# Patient Record
Sex: Female | Born: 1945 | Race: White | Hispanic: No | Marital: Married | Smoking: Former smoker
Health system: Southern US, Community
[De-identification: ages and names within clinical notes are randomized; demographics above are authoritative.]

## PROBLEM LIST (undated history)

## (undated) DIAGNOSIS — E039 Hypothyroidism, unspecified: Secondary | ICD-10-CM

## (undated) DIAGNOSIS — C801 Malignant (primary) neoplasm, unspecified: Secondary | ICD-10-CM

## (undated) DIAGNOSIS — N6099 Unspecified benign mammary dysplasia of unspecified breast: Principal | ICD-10-CM

## (undated) DIAGNOSIS — I10 Essential (primary) hypertension: Secondary | ICD-10-CM

## (undated) DIAGNOSIS — R4781 Slurred speech: Secondary | ICD-10-CM

## (undated) DIAGNOSIS — M1990 Unspecified osteoarthritis, unspecified site: Secondary | ICD-10-CM

## (undated) DIAGNOSIS — R55 Syncope and collapse: Secondary | ICD-10-CM

## (undated) DIAGNOSIS — E079 Disorder of thyroid, unspecified: Secondary | ICD-10-CM

## (undated) HISTORY — PX: BREAST LUMPECTOMY: SHX2

## (undated) HISTORY — DX: Unspecified benign mammary dysplasia of unspecified breast: N60.99

## (undated) HISTORY — DX: Disorder of thyroid, unspecified: E07.9

## (undated) HISTORY — DX: Malignant (primary) neoplasm, unspecified: C80.1

## (undated) HISTORY — DX: Unspecified osteoarthritis, unspecified site: M19.90

## (undated) HISTORY — DX: Syncope and collapse: R55

## (undated) HISTORY — DX: Essential (primary) hypertension: I10

## (undated) HISTORY — DX: Slurred speech: R47.81

## (undated) HISTORY — DX: Hypothyroidism, unspecified: E03.9

---

## 1972-10-22 HISTORY — PX: TUBAL LIGATION: SHX77

## 1981-10-22 HISTORY — PX: EYE SURGERY: SHX253

## 1998-03-08 ENCOUNTER — Ambulatory Visit (HOSPITAL_COMMUNITY): Admission: RE | Admit: 1998-03-08 | Discharge: 1998-03-08 | Payer: Self-pay | Admitting: Cardiology

## 2000-03-27 ENCOUNTER — Other Ambulatory Visit: Admission: RE | Admit: 2000-03-27 | Discharge: 2000-03-27 | Payer: Self-pay | Admitting: Obstetrics & Gynecology

## 2001-07-16 ENCOUNTER — Other Ambulatory Visit: Admission: RE | Admit: 2001-07-16 | Discharge: 2001-07-16 | Payer: Self-pay | Admitting: Obstetrics & Gynecology

## 2002-07-31 ENCOUNTER — Other Ambulatory Visit: Admission: RE | Admit: 2002-07-31 | Discharge: 2002-07-31 | Payer: Self-pay | Admitting: Family Medicine

## 2003-08-24 ENCOUNTER — Other Ambulatory Visit: Admission: RE | Admit: 2003-08-24 | Discharge: 2003-08-24 | Payer: Self-pay | Admitting: Obstetrics & Gynecology

## 2005-01-29 ENCOUNTER — Other Ambulatory Visit: Admission: RE | Admit: 2005-01-29 | Discharge: 2005-01-29 | Payer: Self-pay | Admitting: Obstetrics and Gynecology

## 2005-05-09 ENCOUNTER — Encounter: Admission: RE | Admit: 2005-05-09 | Discharge: 2005-05-09 | Payer: Self-pay | Admitting: Endocrinology

## 2009-09-01 ENCOUNTER — Encounter: Admission: RE | Admit: 2009-09-01 | Discharge: 2009-09-01 | Payer: Self-pay | Admitting: Obstetrics & Gynecology

## 2009-10-24 ENCOUNTER — Ambulatory Visit: Payer: Self-pay | Admitting: Vascular Surgery

## 2010-10-22 HISTORY — PX: BREAST SURGERY: SHX581

## 2010-10-22 HISTORY — PX: BREAST LUMPECTOMY: SHX2

## 2010-11-08 ENCOUNTER — Encounter
Admission: RE | Admit: 2010-11-08 | Discharge: 2010-11-08 | Payer: Self-pay | Source: Home / Self Care | Attending: Obstetrics & Gynecology | Admitting: Obstetrics & Gynecology

## 2010-11-12 ENCOUNTER — Encounter: Payer: Self-pay | Admitting: Obstetrics & Gynecology

## 2010-11-13 ENCOUNTER — Encounter
Admission: RE | Admit: 2010-11-13 | Discharge: 2010-11-13 | Payer: Self-pay | Source: Home / Self Care | Attending: Obstetrics & Gynecology | Admitting: Obstetrics & Gynecology

## 2010-11-13 ENCOUNTER — Other Ambulatory Visit: Payer: Self-pay | Admitting: Diagnostic Radiology

## 2010-11-20 ENCOUNTER — Encounter
Admission: RE | Admit: 2010-11-20 | Discharge: 2010-11-20 | Payer: Self-pay | Source: Home / Self Care | Attending: Obstetrics & Gynecology | Admitting: Obstetrics & Gynecology

## 2010-11-21 ENCOUNTER — Other Ambulatory Visit: Payer: Self-pay | Admitting: General Surgery

## 2010-11-21 DIAGNOSIS — C50912 Malignant neoplasm of unspecified site of left female breast: Secondary | ICD-10-CM

## 2010-11-30 ENCOUNTER — Other Ambulatory Visit: Payer: Self-pay | Admitting: General Surgery

## 2010-11-30 DIAGNOSIS — C50912 Malignant neoplasm of unspecified site of left female breast: Secondary | ICD-10-CM

## 2010-12-04 ENCOUNTER — Other Ambulatory Visit (HOSPITAL_COMMUNITY): Payer: Self-pay | Admitting: General Surgery

## 2010-12-04 ENCOUNTER — Ambulatory Visit (HOSPITAL_COMMUNITY)
Admission: RE | Admit: 2010-12-04 | Discharge: 2010-12-04 | Disposition: A | Discharge status: Home / Self Care | Source: Ambulatory Visit | Attending: General Surgery | Admitting: General Surgery

## 2010-12-04 ENCOUNTER — Encounter (HOSPITAL_COMMUNITY)
Admission: RE | Admit: 2010-12-04 | Discharge: 2010-12-04 | Disposition: A | Discharge status: Home / Self Care | Source: Ambulatory Visit | Attending: General Surgery | Admitting: General Surgery

## 2010-12-04 DIAGNOSIS — D0590 Unspecified type of carcinoma in situ of unspecified breast: Secondary | ICD-10-CM | POA: Insufficient documentation

## 2010-12-04 DIAGNOSIS — D0592 Unspecified type of carcinoma in situ of left breast: Secondary | ICD-10-CM

## 2010-12-04 DIAGNOSIS — Z01818 Encounter for other preprocedural examination: Secondary | ICD-10-CM | POA: Insufficient documentation

## 2010-12-04 LAB — DIFFERENTIAL
Eosinophils Absolute: 0.1 10*3/uL (ref 0.0–0.7)
Eosinophils Relative: 2 % (ref 0–5)
Lymphocytes Relative: 17 % (ref 12–46)
Monocytes Absolute: 1.1 10*3/uL — ABNORMAL HIGH (ref 0.1–1.0)
Monocytes Relative: 14 % — ABNORMAL HIGH (ref 3–12)
Neutro Abs: 5.1 10*3/uL (ref 1.7–7.7)
Neutrophils Relative %: 67 % (ref 43–77)

## 2010-12-04 LAB — COMPREHENSIVE METABOLIC PANEL
ALT: 24 U/L (ref 0–35)
AST: 26 U/L (ref 0–37)
Albumin: 4 g/dL (ref 3.5–5.2)
BUN: 24 mg/dL — ABNORMAL HIGH (ref 6–23)
GFR calc Af Amer: 56 mL/min — ABNORMAL LOW (ref >60)
Glucose, Bld: 168 mg/dL — ABNORMAL HIGH (ref 70–99)
Potassium: 4.4 mEq/L (ref 3.5–5.1)
Sodium: 138 mEq/L (ref 135–145)

## 2010-12-04 LAB — URINALYSIS, ROUTINE W REFLEX MICROSCOPIC
Bilirubin Urine: NEGATIVE
Ketones, ur: NEGATIVE mg/dL
Specific Gravity, Urine: 1.012 (ref 1.005–1.030)
pH: 7 (ref 5.0–8.0)

## 2010-12-04 LAB — CBC
HCT: 42.2 % (ref 36.0–46.0)
MCHC: 33.4 g/dL (ref 30.0–36.0)
RBC: 4.58 MIL/uL (ref 3.87–5.11)

## 2010-12-04 LAB — TSH: TSH: 0.381 u[IU]/mL (ref 0.350–4.500)

## 2010-12-06 ENCOUNTER — Other Ambulatory Visit: Payer: Self-pay | Admitting: General Surgery

## 2010-12-06 ENCOUNTER — Ambulatory Visit
Admission: RE | Admit: 2010-12-06 | Discharge: 2010-12-06 | Disposition: A | Discharge status: Home / Self Care | Source: Ambulatory Visit | Attending: General Surgery | Admitting: General Surgery

## 2010-12-06 ENCOUNTER — Ambulatory Visit (HOSPITAL_COMMUNITY)
Admission: RE | Admit: 2010-12-06 | Discharge: 2010-12-06 | Disposition: A | Discharge status: Home / Self Care | Source: Ambulatory Visit | Attending: General Surgery | Admitting: General Surgery

## 2010-12-06 DIAGNOSIS — F411 Generalized anxiety disorder: Secondary | ICD-10-CM | POA: Insufficient documentation

## 2010-12-06 DIAGNOSIS — C50912 Malignant neoplasm of unspecified site of left female breast: Secondary | ICD-10-CM

## 2010-12-06 DIAGNOSIS — Z794 Long term (current) use of insulin: Secondary | ICD-10-CM | POA: Insufficient documentation

## 2010-12-06 DIAGNOSIS — G4733 Obstructive sleep apnea (adult) (pediatric): Secondary | ICD-10-CM | POA: Insufficient documentation

## 2010-12-06 DIAGNOSIS — D0590 Unspecified type of carcinoma in situ of unspecified breast: Secondary | ICD-10-CM | POA: Insufficient documentation

## 2010-12-06 DIAGNOSIS — I10 Essential (primary) hypertension: Secondary | ICD-10-CM | POA: Insufficient documentation

## 2010-12-06 DIAGNOSIS — Z87891 Personal history of nicotine dependence: Secondary | ICD-10-CM | POA: Insufficient documentation

## 2010-12-06 DIAGNOSIS — E109 Type 1 diabetes mellitus without complications: Secondary | ICD-10-CM | POA: Insufficient documentation

## 2010-12-06 LAB — GLUCOSE, CAPILLARY

## 2010-12-10 NOTE — Op Note (Signed)
April Long, April Long                ACCOUNT NO.:  [id number]  MEDICAL RECORD NO.:  [id number]           PATIENT TYPE:  O  LOCATION:  [facility]                         FACILITY:  [facility]  PHYSICIAN:  Angelia Mould. Derrell Lolling, M.D.DATE OF BIRTH:  [birth year]  DATE OF PROCEDURE:  12/06/2010 DATE OF DISCHARGE:  12/06/2010                              OPERATIVE REPORT   PREOPERATIVE DIAGNOSIS:  Lobular carcinoma in situ, left breast.  POSTOPERATIVE DIAGNOSIS:  Lobular carcinoma in situ, left breast.  OPERATION PERFORMED:  Left partial mastectomy with needle localizations.  SURGEON:  Angelia Mould. Derrell Lolling, MD  OPERATIVE INDICATIONS:  This is a 65 year old Caucasian female with type 1, insulin-dependent diabetes and hypertension.  She has no prior history of breast problems.  Recent screening mammogram showed an area of suspicious microcalcifications at the 4 o'clock position to the left breast.  Core biopsy of this area showed lobular carcinoma in situ which was ER 100% positive, PR 50% positive.  MRI was then performed which showed a 1.8-cm area of hematoma in the lower outer quadrant of the left breast with an adjacent 1.7-cm area of enhancement.  This appeared to be a solitary finding.  There was no evidence of any invasive disease. Lymph nodes were clinically and radiographically negative.  She was counseled as an outpatient.  I felt that the next step in her care would be to simply excise this area to exclude occult invasive disease.  She is brought to the operating room electively.  OPERATIVE TECHNIQUE:  The patient underwent wire localization at the [facility] this morning.  The wire entered from the far lateral breast and was directed medially at about the 3:30 to 4 o'clock position.  The wire went slightly anterior to the calcifications in the marker clip.  The patient was brought to the operating room.  General anesthesia was induced.  The left breast was prepped and draped  in a sterile fashion.  Intravenous antibiotics were given.  Surgical time-out was held identifying the correct patient, correct procedure and correct site.  Marcaine 0.5% with epinephrine used as a local infiltration anesthetic.  A radially oriented incision was made about the 4 o'clock position of the left breast, going right through the insertion site of the wire.  Dissection was carried down into the breast tissue circumferentially around the wire.  The specimen was removed.  It was marked with a 6-color margin marker kit.  Specimen mammogram was performed and we could see the calcifications in the marker clip in the center of the specimen.  It was felt to be adequately excised by both me and the radiologist.  The specimen was sent for routine pathologic exam. Hemostasis was excellent and achieved electrocautery.  The wound was irrigated with saline.  The breast tissue was closed in 2 layers of interrupted sutures of 3-0 Vicryl.  The skin was closed with running subcuticular suture of 4-0 Monocryl and Dermabond.  The patient tolerated the procedure well and was taken to recovery room in stable condition.  Estimated blood loss was about 10-15 mL.  Complication was none.  Sponge, needle and instruments counts  were correct.     Angelia Mould. Derrell Lolling, M.D.     HMI/MEDQ  D:  12/06/2010  T:  12/07/2010  Job:  [id number]  cc:   Veverly Fells. Altheimer, M.D. [facility]  Electronically Signed by Claud Kelp M.D. on 12/10/2010 07:08:31 PM

## 2010-12-29 ENCOUNTER — Encounter (HOSPITAL_BASED_OUTPATIENT_CLINIC_OR_DEPARTMENT_OTHER): Admitting: Oncology

## 2010-12-29 ENCOUNTER — Other Ambulatory Visit: Payer: Self-pay | Admitting: Oncology

## 2010-12-29 DIAGNOSIS — C50919 Malignant neoplasm of unspecified site of unspecified female breast: Secondary | ICD-10-CM

## 2010-12-29 LAB — CBC WITH DIFFERENTIAL/PLATELET
BASO%: 0.7 % (ref 0.0–2.0)
Basophils Absolute: 0 10*3/uL (ref 0.0–0.1)
EOS%: 1.8 % (ref 0.0–7.0)
HCT: 39.2 % (ref 34.8–46.6)
LYMPH%: 20.9 % (ref 14.0–49.7)
MONO#: 0.8 10*3/uL (ref 0.1–0.9)
MONO%: 13.4 % (ref 0.0–14.0)
NEUT#: 3.7 10*3/uL (ref 1.5–6.5)
NEUT%: 63.2 % (ref 38.4–76.8)
RBC: 4.29 10*6/uL (ref 3.70–5.45)
RDW: 13.3 % (ref 11.2–14.5)
lymph#: 1.2 10*3/uL (ref 0.9–3.3)

## 2010-12-29 LAB — COMPREHENSIVE METABOLIC PANEL
Creatinine, Ser: 1.02 mg/dL (ref 0.40–1.20)
Total Bilirubin: 0.5 mg/dL (ref 0.3–1.2)
Total Protein: 6.8 g/dL (ref 6.0–8.3)

## 2011-02-17 ENCOUNTER — Encounter (INDEPENDENT_AMBULATORY_CARE_PROVIDER_SITE_OTHER): Payer: Self-pay | Admitting: General Surgery

## 2011-03-06 NOTE — Procedures (Signed)
LOWER EXTREMITY VENOUS REFLUX EXAM   INDICATION:  Bilateral swelling, pain, discoloration and varicosities.   EXAM:  Using color-flow imaging and pulse Doppler spectral analysis,  bilateral common femoral, superficial femoral, popliteal, posterior  tibial, greater and lesser saphenous veins were evaluated.  There is no  evidence suggesting deep venous insufficiency in the bilateral lower  extremities.   The bilateral saphenofemoral junction is competent.  The bilateral  greater saphenous vein is competent.   The bilateral proximal short saphenous vein demonstrates competency.   GSV Diameter (used if found to be incompetent only)                                            Right    Left  Proximal Greater Saphenous Vein           cm       cm  Proximal-to-mid-thigh                     cm       cm  Mid thigh                                 cm       cm  Mid-distal thigh                          cm       cm  Distal thigh                              cm       cm  Knee                                      cm       cm   IMPRESSION:  1. Bilateral greater saphenous veins are competent.  2. The deep venous system is competent.  3. The bilateral lesser saphenous veins are competent.  4. All varicosities appear to be connected to incompetent perforator      veins, on the right measuring 0.36 cm in the medial mid calf; on      the left, the medial mid to distal calf, measuring 0.38 cm; and      posterior calf, measuring 0.35 cm.   ___________________________________________  Quita Skye Hart [place], M.D.   CJ/MEDQ  D:  10/24/2009  T:  10/25/2009  Job:  [id number]

## 2011-03-06 NOTE — Consult Note (Signed)
NEW PATIENT CONSULTATION   April Long, April Long  DOB:  12-01-1945                                       10/24/2009  ZOXWR#:[id number]   Patient is a 65 year old female referred for possible venous  insufficiency, both lower extremities.  She has been having some scaly  thickening of the skin bilaterally between the knee and the ankle, the  left slightly worse than the right.  She describes an aching area in the  anterior aspect of the shin bilaterally after ambulating about 2 miles.  She does not have rest pain or history of nonhealing ulcers, infection,  bleeding, thrombophlebitis, deep venous thrombosis, or other vascular  problems.  She has had some varicose veins in the left calf evaluated by  Dr. Sondra Come in [facility], who does not recommend any specific treatment.  She does not wear elastic compression stockings but does have some mild  edema of the legs as the day progresses.   PAST MEDICAL HISTORY/STABLE CHRONIC PROBLEMS:  1. Type 1 diabetes mellitus.  2. Hyperlipidemia.  3. Negative for hypertension, coronary artery disease, COPD, or      stroke.   FAMILY HISTORY:  Negative for coronary artery disease, diabetes, or  stroke.   SOCIAL HISTORY:  She is married.  She is retired.  Has not smoked in  over 8 years.  Drinks occasional glass of wine.   REVIEW OF SYSTEMS:  Negative for weight loss, chest pain, dyspnea on  exertion, PND, orthopnea, productive cough, bronchitis.  Negative GI,  GU.  Vascular is negative.  No neurologic, musculoskeletal psychiatric,  or ENT symptoms.  All other systems are negative.   ALLERGIES:  Sulfa drugs.   PHYSICAL EXAMINATION:  Blood pressure 123/60, heart rate 83,  respirations 18.  Generally, she is a well-developed, well-nourished  middle-aged female in no apparent distress, alert and oriented x3.  Neck  is supple, 3+ carotid pulses palpable.  No bruits are audible.  Neurologic exam is normal.  HEENT exam is intact.  EOMs  intact.  Chest  clear to auscultation.  CARDIOVASCULAR:  Regular rhythm with no murmurs.  Abdomen is soft, nontender with no masses.  She has 3+ femoral,  popliteal, and a 3+ dorsalis pedis pulse on the right and a 2+ on the  left.  Both feet are adequately perfused with no edema.  Left leg has a  patch of varicosities in the medial calf slightly posteriorly, and there  is also some small reticular veins near the left medial malleolus.  There are some spider veins in both anterior thigh regions.   I ordered a venous duplex exam today, which I visualized and  interpreted.  It revealed the deep systems to be normal bilaterally.  Great saphenous and small saphenous veins had no reflux bilaterally.  There was an incompetent perforator in the left mid calf communicating  with the varicosities.   I do not think her symptoms are due to venous insufficiency.  She has  been told the past that she has PAD, but I do not think her arterial  insufficiency is severe but probably more mild because of her diabetes  and certainly not symptomatic enough to treat.  I have recommended  elastic compression stockings, and if she would like to have these small  varicosities and reticular and spider veins treated, we will be happy to  treat her with sclerotherapy, which she is considering.     Quita Skye Hart [place], M.D.  Electronically Signed   JDL/MEDQ  D:  10/24/2009  T:  10/25/2009  Job:  [id number]   cc:   Freddy Finner, M.D.

## 2011-04-17 ENCOUNTER — Encounter (HOSPITAL_BASED_OUTPATIENT_CLINIC_OR_DEPARTMENT_OTHER): Admitting: Oncology

## 2011-04-17 ENCOUNTER — Other Ambulatory Visit: Payer: Self-pay | Admitting: Oncology

## 2011-04-17 DIAGNOSIS — C50919 Malignant neoplasm of unspecified site of unspecified female breast: Secondary | ICD-10-CM

## 2011-04-17 LAB — CBC WITH DIFFERENTIAL/PLATELET
BASO%: 0.4 % (ref 0.0–2.0)
EOS%: 1.4 % (ref 0.0–7.0)
MCH: 31.6 pg (ref 25.1–34.0)
MCHC: 34.3 g/dL (ref 31.5–36.0)
MCV: 92.1 fL (ref 79.5–101.0)
MONO%: 13.3 % (ref 0.0–14.0)
RBC: 4.05 10*6/uL (ref 3.70–5.45)
RDW: 13.6 % (ref 11.2–14.5)

## 2011-04-17 LAB — COMPREHENSIVE METABOLIC PANEL
AST: 20 U/L (ref 0–37)
Albumin: 3.8 g/dL (ref 3.5–5.2)
Alkaline Phosphatase: 41 U/L (ref 39–117)
BUN: 27 mg/dL — ABNORMAL HIGH (ref 6–23)
Potassium: 4 mEq/L (ref 3.5–5.3)
Sodium: 139 mEq/L (ref 135–145)
Total Protein: 6.3 g/dL (ref 6.0–8.3)

## 2011-06-12 ENCOUNTER — Other Ambulatory Visit: Payer: Self-pay | Admitting: Obstetrics and Gynecology

## 2011-09-18 DIAGNOSIS — H3520 Other non-diabetic proliferative retinopathy, unspecified eye: Secondary | ICD-10-CM

## 2011-09-18 HISTORY — DX: Other non-diabetic proliferative retinopathy, unspecified eye: H35.20

## 2011-10-05 ENCOUNTER — Other Ambulatory Visit: Payer: Self-pay | Admitting: *Deleted

## 2011-10-20 ENCOUNTER — Telehealth: Payer: Self-pay | Admitting: Oncology

## 2011-10-20 NOTE — Telephone Encounter (Signed)
Called pt, left message for February 2013 lab and MD

## 2011-11-15 ENCOUNTER — Encounter (INDEPENDENT_AMBULATORY_CARE_PROVIDER_SITE_OTHER): Payer: Self-pay | Admitting: General Surgery

## 2011-11-29 ENCOUNTER — Encounter: Payer: Self-pay | Admitting: Oncology

## 2011-11-29 ENCOUNTER — Other Ambulatory Visit (HOSPITAL_BASED_OUTPATIENT_CLINIC_OR_DEPARTMENT_OTHER)

## 2011-11-29 ENCOUNTER — Telehealth: Payer: Self-pay | Admitting: Oncology

## 2011-11-29 ENCOUNTER — Ambulatory Visit (HOSPITAL_BASED_OUTPATIENT_CLINIC_OR_DEPARTMENT_OTHER): Admitting: Oncology

## 2011-11-29 DIAGNOSIS — D0502 Lobular carcinoma in situ of left breast: Secondary | ICD-10-CM

## 2011-11-29 DIAGNOSIS — D0590 Unspecified type of carcinoma in situ of unspecified breast: Secondary | ICD-10-CM

## 2011-11-29 DIAGNOSIS — N6099 Unspecified benign mammary dysplasia of unspecified breast: Secondary | ICD-10-CM | POA: Insufficient documentation

## 2011-11-29 DIAGNOSIS — C50919 Malignant neoplasm of unspecified site of unspecified female breast: Secondary | ICD-10-CM

## 2011-11-29 HISTORY — DX: Unspecified benign mammary dysplasia of unspecified breast: N60.99

## 2011-11-29 HISTORY — DX: Lobular carcinoma in situ of left breast: D05.02

## 2011-11-29 LAB — CBC WITH DIFFERENTIAL/PLATELET
Basophils Absolute: 0 10*3/uL (ref 0.0–0.1)
EOS%: 2.6 % (ref 0.0–7.0)
HGB: 13.4 g/dL (ref 11.6–15.9)
MCH: 31.7 pg (ref 25.1–34.0)
MCV: 93.9 fL (ref 79.5–101.0)
MONO%: 11.7 % (ref 0.0–14.0)
RDW: 14.3 % (ref 11.2–14.5)

## 2011-11-29 LAB — COMPREHENSIVE METABOLIC PANEL
AST: 27 U/L (ref 0–37)
BUN: 28 mg/dL — ABNORMAL HIGH (ref 6–23)
Calcium: 9.4 mg/dL (ref 8.4–10.5)
Chloride: 102 mEq/L (ref 96–112)
Creatinine, Ser: 1.22 mg/dL — ABNORMAL HIGH (ref 0.50–1.10)

## 2011-11-29 MED ORDER — TAMOXIFEN CITRATE 20 MG PO TABS: 20.0000 mg | ORAL_TABLET | Freq: Every day | ORAL | Status: AC

## 2011-11-29 NOTE — Progress Notes (Signed)
OFFICE PROGRESS NOTE  CC Dr. Claud Kelp DIAGNOSIS: 66 year old female with lobular carcinoma in situ of the left breast  PRIOR THERAPY:  #1 patient is status post lumpectomy on 12/06/2010. 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  #2 she was begun on chemoprevention with tamoxifen 20 mg daily.  CURRENT THERAPY: Tamoxifen 20 mg daily since March 2012  INTERVAL HISTORY: April Long 66 y.o. female returns for Followup visit. Her last visit with me was in June 2012. Overall she is doing well she is without any significant complaints she denies any fevers chills night sweats headaches shortness of breath chest pains palpitations. She did develop some urinary tract infections over the last 6 months. Initially was felt that it could possibly be vaginal bleeding she had a full evaluation performed there was no evidence of any vaginal pathology. She was then treated for urinary tract infections and she has not had any recurrence of her bleeding again. Overall she's doing well she does get some hot flashes she has not noticed any swelling in her lower extremities no thrombosis. She has no headaches double vision blurring of vision she has no shortness of breath no chest pains. She does have some urinary incontinence and she is working with urology to his strength and her pelvic muscles. Remainder of the 10 point review of systems is negative her  MEDICAL HISTORY: Past Medical History  Diagnosis Date  . Diabetes mellitus     type 1, for almost 48 years  . Thyroid disease   . 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 Lobular carcinoma in situ 11/29/2011    ALLERGIES:  is allergic to sulfa drugs cross reactors.  MEDICATIONS:  Current Outpatient Prescriptions  Medication Sig Dispense Refill  . aspirin 81 MG tablet Take 81 mg by mouth daily.      Marland Kitchen atorvastatin (LIPITOR) 10 MG tablet Take 10 mg by mouth daily.       Marland Kitchen co-enzyme Q-10 30 MG capsule Take 100 mg by mouth 3 (three) times daily.      . Multiple Vitamin (MULTIVITAMIN) tablet Take 1 tablet by mouth daily.      . tamoxifen (NOLVADEX) 20 MG tablet Take 20 mg by mouth daily.      . Escitalopram Oxalate (LEXAPRO PO) Take 10 mg by mouth. For Anxiety, no dosage listed      . Insulin Aspart (NOVOLOG [place]) Inject into the skin. Insulin pump       . Levothyroxine Sodium (SYNTHROID PO) Take by mouth.        Marland Kitchen lisinopril (PRINIVIL,ZESTRIL) 20 MG tablet Take 20 mg by mouth daily.        . Misc Natural Products (CHOLESTEROL RELIEF PO) Take by mouth.        . tamoxifen (NOLVADEX) 20 MG tablet Take 1 tablet (20 mg total) by mouth daily.  90 tablet  4  . VITAMIN D, CHOLECALCIFEROL, PO Take 1,000 Units by mouth.         SURGICAL HISTORY:  Past Surgical History  Procedure Date  . Tubal ligation 1974  . Eye surgery 1983    REVIEW OF SYSTEMS:  Pertinent items are noted in HPI.   PHYSICAL EXAMINATION: General appearance: alert, cooperative and appears stated age Neck: no adenopathy, no carotid bruit, no JVD, supple, symmetrical, trachea midline and thyroid not enlarged, symmetric, no tenderness/mass/nodules Lymph nodes: Cervical, supraclavicular, and axillary  nodes normal. Resp: clear to auscultation bilaterally and normal percussion bilaterally Back: symmetric, no curvature. ROM normal. No CVA tenderness. Cardio: regular rate and rhythm, S1, S2 normal, no murmur, click, rub or gallop GI: soft, non-tender; bowel sounds normal; no masses,  no organomegaly Extremities: extremities normal, atraumatic, no cyanosis or edema Neurologic: Alert and oriented X 3, normal strength and tone. Normal symmetric reflexes. Normal coordination and gait Bilateral breast examination: Patient has no masses nipple discharge. Left breast reveals a well-healed surgical scar. No nodularity or masses ECOG PERFORMANCE STATUS: 1 - Symptomatic but completely ambulatory  Blood  pressure 124/66, pulse 85, temperature 99.2 F (37.3 C), temperature source Oral, height 5\' 7"  (1.702 m), weight 187 lb 3.2 oz (84.913 kg).  LABORATORY DATA: Lab Results  Component Value Date   WBC 6.3 11/29/2011   HGB 13.4 11/29/2011   HCT 39.7 11/29/2011   MCV 93.9 11/29/2011   PLT 216 11/29/2011      Chemistry      Component Value Date/Time   NA 139 04/17/2011 1036   K 4.0 04/17/2011 1036   CL 103 04/17/2011 1036   CO2 28 04/17/2011 1036   BUN 27* 04/17/2011 1036   CREATININE 1.07 04/17/2011 1036      Component Value Date/Time   CALCIUM 9.0 04/17/2011 1036   ALKPHOS 41 04/17/2011 1036   AST 20 04/17/2011 1036   ALT 16 04/17/2011 1036   BILITOT 0.4 04/17/2011 1036       RADIOGRAPHIC STUDIES:  No results found.  ASSESSMENT: 66 year old female with lobular carcinoma in situ originally diagnosed in October 2012. Since then she has been on tamoxifen 20 mg daily overall she's doing well. Her mammograms are up-to-date.   PLAN: Patient will be seen back in 6 months time in the high-risk clinic by Colman Cater. Prescription for tamoxifen was given to the patient.   All questions were answered. The patient knows to call the clinic with any problems, questions or concerns. We can certainly see the patient much sooner if necessary.  I spent 25 minutes counseling the patient face to face. The total time spent in the appointment was 30 minutes.    Drue Second, MD Medical/Oncology [facility] [phone] (beeper) [phone] (Office)  11/29/2011, 10:06 AM

## 2011-11-29 NOTE — Telephone Encounter (Signed)
gve the pt her aug 2013 appts °

## 2011-12-10 ENCOUNTER — Other Ambulatory Visit: Payer: Self-pay | Admitting: Obstetrics & Gynecology

## 2011-12-10 DIAGNOSIS — Z853 Personal history of malignant neoplasm of breast: Secondary | ICD-10-CM

## 2011-12-19 ENCOUNTER — Encounter

## 2011-12-20 ENCOUNTER — Ambulatory Visit
Admission: RE | Admit: 2011-12-20 | Discharge: 2011-12-20 | Disposition: A | Discharge status: Home / Self Care | Source: Ambulatory Visit | Attending: Obstetrics & Gynecology | Admitting: Obstetrics & Gynecology

## 2011-12-20 ENCOUNTER — Encounter (INDEPENDENT_AMBULATORY_CARE_PROVIDER_SITE_OTHER): Payer: Self-pay | Admitting: General Surgery

## 2011-12-20 ENCOUNTER — Ambulatory Visit (INDEPENDENT_AMBULATORY_CARE_PROVIDER_SITE_OTHER): Admitting: General Surgery

## 2011-12-20 VITALS — BP 112/60 | HR 68 | Temp 97.8°F | Resp 18 | Ht 67.0 in | Wt 181.0 lb

## 2011-12-20 DIAGNOSIS — D0502 Lobular carcinoma in situ of left breast: Secondary | ICD-10-CM

## 2011-12-20 DIAGNOSIS — D0590 Unspecified type of carcinoma in situ of unspecified breast: Secondary | ICD-10-CM

## 2011-12-20 DIAGNOSIS — Z853 Personal history of malignant neoplasm of breast: Secondary | ICD-10-CM

## 2011-12-20 NOTE — Progress Notes (Signed)
Patient ID: CAERA ENWRIGHT, female   DOB: 1946-03-17, 66 y.o.   MRN: [id number]  Chief Complaint  Patient presents with  . Breast Cancer Long Term Follow Up    MGM scheduled after 12/09/10    HPI Kenyia Wambolt Wiesen is a 66 y.o. female.  She returns for long-term followup and reevaluation of lobular carcinoma in situ left breast.  On November 05, 2010 she underwent left partial mastectomy with needle localization. Final pathology showed receptor positive lobular carcinoma in situ. She recovered uneventfully. She was referred to Dr. Drue Second in the high risk clinic. She has been on tamoxifen and is doing fairly well with that.  She has no new complaints about her breast. She is scheduled for diagnostic mammograms this afternoon. HPI  Past Medical History  Diagnosis Date  . Diabetes mellitus     type 1, for almost 48 years  . Thyroid disease   . 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 Cancer   . Osteoporosis     early stage    Past Surgical History  Procedure Date  . Tubal ligation 1974  . Eye surgery 1983    retinal reattachment  . Breast surgery 2012    lumpectomy    Family History  Problem Relation Age of Onset  . Alzheimer's disease Mother   . Heart disease Father   . Heart failure Father     Social History History  Substance Use Topics  . Smoking status: Former Smoker    Quit date: 12/19/2000  . Smokeless tobacco: Never Used  . Alcohol Use: 4.2 oz/week    7 Glasses of wine per week     1 -2 glasses of wine daily or weekly    Allergies  Allergen Reactions  . Sulfa Drugs Cross Reactors Rash    On chest only    Current Outpatient Prescriptions  Medication Sig Dispense Refill  . Coenzyme Q10 (CO Q-10 PO) Take by mouth as needed.      Marland Kitchen aspirin 81 MG tablet Take 81 mg by mouth daily.      Marland Kitchen atorvastatin (LIPITOR) 10 MG tablet Take 10 mg by mouth daily.      Marland Kitchen  co-enzyme Q-10 30 MG capsule Take 100 mg by mouth 3 (three) times daily.      . Escitalopram Oxalate (LEXAPRO PO) Take 10 mg by mouth. For Anxiety, no dosage listed      . Insulin Aspart (NOVOLOG [place]) Inject into the skin. Insulin pump       . Levothyroxine Sodium (SYNTHROID PO) Take by mouth.        Marland Kitchen lisinopril (PRINIVIL,ZESTRIL) 20 MG tablet Take 20 mg by mouth daily.        . Misc Natural Products (CHOLESTEROL RELIEF PO) Take by mouth.        . Multiple Vitamin (MULTIVITAMIN) tablet Take 1 tablet by mouth daily.      . tamoxifen (NOLVADEX) 20 MG tablet Take 1 tablet (20 mg total) by mouth daily.  90 tablet  4  . VITAMIN D, CHOLECALCIFEROL, PO Take 1,000 Units by mouth.         Review of Systems Review of Systems  Constitutional: Negative for fever, chills and unexpected weight change.  HENT: Negative for hearing loss, congestion, sore throat, trouble swallowing and voice change.   Eyes: Negative for visual disturbance.  Respiratory: Negative for cough  and wheezing.   Cardiovascular: Negative for chest pain, palpitations and leg swelling.  Gastrointestinal: Negative for nausea, vomiting, abdominal pain, diarrhea, constipation, blood in stool, abdominal distention and anal bleeding.  Genitourinary: Negative for hematuria, vaginal bleeding and difficulty urinating.  Musculoskeletal: Negative for arthralgias.  Skin: Negative for rash and wound.  Neurological: Negative for seizures, syncope and headaches.  Hematological: Negative for adenopathy. Does not bruise/bleed easily.  Psychiatric/Behavioral: Negative for confusion.    Blood pressure 112/60, pulse 68, temperature 97.8 F (36.6 C), temperature source Temporal, resp. rate 18, height 5\' 7"  (1.702 m), weight 181 lb (82.101 kg).  Physical Exam Physical Exam  Constitutional: She is oriented to person, place, and time. She appears well-developed and well-nourished. No distress.  HENT:  Head: Normocephalic and atraumatic.  Nose: Nose  normal.  Eyes: EOM are normal.  Neck: Neck supple. No JVD present. No tracheal deviation present. No thyromegaly present.  Cardiovascular: Normal rate, regular rhythm, normal heart sounds and intact distal pulses.   No murmur heard. Pulmonary/Chest: Effort normal and breath sounds normal. No respiratory distress. She has no wheezes. She has no rales. She exhibits no tenderness.       Well-healed scar lateral left breast, lower outer quadrant. No mass either breast. No axillary adenopathy bilaterally.  Lymphadenopathy:    She has no cervical adenopathy.  Neurological: She is alert and oriented to person, place, and time. She exhibits normal muscle tone. Coordination normal.  Skin: Skin is warm. No rash noted. She is not diaphoretic. No erythema. No pallor.  Psychiatric: She has a normal mood and affect. Her behavior is normal. Judgment and thought content normal.    Data Reviewed  I reviewed Dr. Milta Deiters notes from the cancer center. I reviewed my old  records. Assessment    Lobular carcinoma in situ left breast, lower outer quadrant, status post left partial mastectomy with negative margins.  No evidence of recurrence by physical exam one year postop  Patient is due for mammograms today    Plan    She is on tamoxifen and being followed by Dr. Drue Second every 6 months.  Considering the frequency with which she is being screened at a high risk clinic, I suggested to her that there is no added value in following up with me. She agrees with that.  She will get her mammograms today and annually.  Return to see me of any new problems arise.   Angelia Mould. Derrell Lolling, M.D., [facility] General and Minimally invasive Surgery Breast and Colorectal Surgery Office:   [phone] Pager:   [phone]     12/20/2011, 9:50 AM

## 2011-12-20 NOTE — Patient Instructions (Signed)
Your physical exam today is normal. I did not find any abnormality of either breast or any other lymph node areas.  Be sure to get your mammograms that are scheduled for this afternoon.  Since you are on tamoxifen and being followed by Dr. Welton Flakes every 6 months, there is probably no added value in seeing me. You may return to see me if any new problems arise.

## 2012-05-02 ENCOUNTER — Other Ambulatory Visit: Payer: Self-pay | Admitting: Obstetrics & Gynecology

## 2012-05-13 ENCOUNTER — Telehealth: Payer: Self-pay | Admitting: Medical Oncology

## 2012-05-13 NOTE — Telephone Encounter (Signed)
Spoke to patient about appointment change, new appointment 9/30- lab @ 0930, Dr. Welton Flakes @ 1000.  Patient confirmed date and time. Instructed patient to call with any questions or concerns

## 2012-05-28 ENCOUNTER — Other Ambulatory Visit: Admitting: Lab

## 2012-05-28 ENCOUNTER — Ambulatory Visit: Admitting: Oncology

## 2012-07-09 ENCOUNTER — Ambulatory Visit: Admitting: Oncology

## 2012-07-09 ENCOUNTER — Other Ambulatory Visit: Admitting: Lab

## 2012-07-21 ENCOUNTER — Encounter: Payer: Self-pay | Admitting: Oncology

## 2012-07-21 ENCOUNTER — Telehealth: Payer: Self-pay | Admitting: *Deleted

## 2012-07-21 ENCOUNTER — Other Ambulatory Visit (HOSPITAL_BASED_OUTPATIENT_CLINIC_OR_DEPARTMENT_OTHER): Admitting: Lab

## 2012-07-21 ENCOUNTER — Ambulatory Visit (HOSPITAL_BASED_OUTPATIENT_CLINIC_OR_DEPARTMENT_OTHER): Admitting: Oncology

## 2012-07-21 VITALS — BP 121/66 | HR 73 | Temp 98.4°F | Resp 20 | Ht 67.0 in | Wt 187.8 lb

## 2012-07-21 DIAGNOSIS — G4733 Obstructive sleep apnea (adult) (pediatric): Secondary | ICD-10-CM

## 2012-07-21 DIAGNOSIS — D0590 Unspecified type of carcinoma in situ of unspecified breast: Secondary | ICD-10-CM

## 2012-07-21 DIAGNOSIS — D0500 Lobular carcinoma in situ of unspecified breast: Secondary | ICD-10-CM

## 2012-07-21 DIAGNOSIS — C50919 Malignant neoplasm of unspecified site of unspecified female breast: Secondary | ICD-10-CM

## 2012-07-21 DIAGNOSIS — N6099 Unspecified benign mammary dysplasia of unspecified breast: Secondary | ICD-10-CM

## 2012-07-21 LAB — CBC WITH DIFFERENTIAL/PLATELET
Basophils Absolute: 0 10*3/uL (ref 0.0–0.1)
Eosinophils Absolute: 0.2 10*3/uL (ref 0.0–0.5)
HCT: 38.7 % (ref 34.8–46.6)
HGB: 13.2 g/dL (ref 11.6–15.9)
MCH: 32.3 pg (ref 25.1–34.0)
MONO#: 0.8 10*3/uL (ref 0.1–0.9)
NEUT%: 69.8 % (ref 38.4–76.8)
WBC: 6.6 10*3/uL (ref 3.9–10.3)
lymph#: 1 10*3/uL (ref 0.9–3.3)

## 2012-07-21 LAB — COMPREHENSIVE METABOLIC PANEL (CC13)
Alkaline Phosphatase: 40 U/L (ref 40–150)
Creatinine: 1.1 mg/dL (ref 0.6–1.1)
Glucose: 159 mg/dl — ABNORMAL HIGH (ref 70–99)
Sodium: 138 mEq/L (ref 136–145)
Total Bilirubin: 0.6 mg/dL (ref 0.20–1.20)
Total Protein: 6.2 g/dL — ABNORMAL LOW (ref 6.4–8.3)

## 2012-07-21 MED ORDER — TAMOXIFEN CITRATE 20 MG PO TABS: 20.0000 mg | ORAL_TABLET | Freq: Every day | ORAL | Status: DC

## 2012-07-21 NOTE — Progress Notes (Signed)
OFFICE PROGRESS NOTE  CC Dr. 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  #2 she was begun on chemoprevention with tamoxifen 20 mg daily.  CURRENT THERAPY: Tamoxifen 20 mg daily since March 2012  INTERVAL HISTORY: April Long 66 y.o. female returns for Followup visit. Overall she is doing well. She did have some bleeding issues vaginally but this is now resolved. She had D&Cs. No further rebleeding has occurred. In the meantime she remains on tamoxifen she has no him lumps bumps she denies any nausea vomiting fevers chills night sweats. No myalgias or arthralgias. Patient does use a CPAP for obstructive sleep apnea. Remainder of the 10 point review of systems is negative  MEDICAL HISTORY: Past Medical History  Diagnosis Date  . Diabetes mellitus     type 1, for almost 48 years  . Thyroid disease   . 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 Cancer   . Osteoporosis     early stage    ALLERGIES:  is allergic to sulfa drugs cross reactors.  MEDICATIONS:  Current Outpatient Prescriptions  Medication Sig Dispense Refill  . aspirin 81 MG tablet Take 81 mg by mouth daily.      Marland Kitchen atorvastatin (LIPITOR) 10 MG tablet Take 10 mg by mouth daily.      . Escitalopram Oxalate (LEXAPRO PO) Take 20 mg by mouth. For Anxiety, no dosage listed      . Insulin Aspart (NOVOLOG [place]) Inject into the skin. Insulin pump       . Levothyroxine Sodium (SYNTHROID PO) Take by mouth.        Marland Kitchen lisinopril (PRINIVIL,ZESTRIL) 20 MG tablet Take 20 mg by mouth daily.        . Multiple Vitamin (MULTIVITAMIN) tablet Take 1 tablet by mouth daily.      . tamoxifen (NOLVADEX) 20 MG tablet Take 20 mg by mouth daily.      Marland Kitchen VITAMIN D,  CHOLECALCIFEROL, PO Take 1,000 Units by mouth.       . co-enzyme Q-10 30 MG capsule Take 100 mg by mouth 3 (three) times daily.      . Coenzyme Q10 (CO Q-10 PO) Take by mouth as needed.        SURGICAL HISTORY:  Past Surgical History  Procedure Date  . Tubal ligation 1974  . Eye surgery 1983    retinal reattachment  . Breast surgery 2012    lumpectomy    REVIEW OF SYSTEMS:  Pertinent items are noted in HPI.   PHYSICAL EXAMINATION: General appearance: alert, cooperative and appears stated age Neck: no adenopathy, no carotid bruit, no JVD, supple, symmetrical, trachea midline and thyroid not enlarged, symmetric, no tenderness/mass/nodules Lymph nodes: Cervical, supraclavicular, and axillary nodes normal. Resp: clear to auscultation bilaterally and normal percussion bilaterally Back: symmetric, no curvature. ROM normal. No CVA tenderness. Cardio: regular rate and rhythm, S1, S2 normal, no murmur, click, rub or gallop GI: soft, non-tender; bowel sounds normal; no masses,  no organomegaly Extremities: extremities normal, atraumatic, no cyanosis or edema Neurologic: Alert and oriented X 3, normal strength and tone. Normal symmetric reflexes. Normal coordination and gait Bilateral breast examination: Patient has no masses nipple discharge. Left breast reveals a  well-healed surgical scar. No nodularity or masses ECOG PERFORMANCE STATUS: 1 - Symptomatic but completely ambulatory  Blood pressure 121/66, pulse 73, temperature 98.4 F (36.9 C), temperature source Oral, resp. rate 20, height 5\' 7"  (1.702 m), weight 187 lb 12.8 oz (85.186 kg).  LABORATORY DATA: Lab Results  Component Value Date   WBC 6.6 07/21/2012   HGB 13.2 07/21/2012   HCT 38.7 07/21/2012   MCV 94.9 07/21/2012   PLT 219 07/21/2012      Chemistry      Component Value Date/Time   NA 140 11/29/2011 0907   K 5.3 11/29/2011 0907   CL 102 11/29/2011 0907   CO2 27 11/29/2011 0907   BUN 28* 11/29/2011 0907   CREATININE 1.22* 11/29/2011  0907      Component Value Date/Time   CALCIUM 9.4 11/29/2011 0907   ALKPHOS 36* 11/29/2011 0907   AST 27 11/29/2011 0907   ALT 19 11/29/2011 0907   BILITOT 0.4 11/29/2011 0907       RADIOGRAPHIC STUDIES:  No results found.  ASSESSMENT: 66 year old female with lobular carcinoma in situ originally diagnosed in October 2012. Since then she has been on tamoxifen 20 mg daily overall she's doing well. Her mammograms are up-to-date.   PLAN:  #1 patient will continue tamoxifen 20 mg daily.  #2 I will see her back in 6 months time. She will call with any problems questions or concerns in the interim.  All questions were answered. The patient knows to call the clinic with any problems, questions or concerns. We can certainly see the patient much sooner if necessary.  I spent 25 minutes counseling the patient face to face. The total time spent in the appointment was 30 minutes.    Drue Second, MD Medical/Oncology [facility] [phone] (beeper) [phone] (Office)  07/21/2012, 10:03 AM

## 2012-07-21 NOTE — Patient Instructions (Addendum)
Doing well no evidence of recurrence  You are tolerating tamoxifen, a new prescription was given to you   I will see you back in 6 months.  Please call if you have any concerns that arise

## 2012-07-21 NOTE — Telephone Encounter (Signed)
Gave patient appointment for 01-22-2013 starting at 10:00am 

## 2013-01-21 ENCOUNTER — Other Ambulatory Visit: Payer: Self-pay | Admitting: Obstetrics & Gynecology

## 2013-01-21 DIAGNOSIS — Z853 Personal history of malignant neoplasm of breast: Secondary | ICD-10-CM

## 2013-01-21 DIAGNOSIS — Z124 Encounter for screening for malignant neoplasm of cervix: Secondary | ICD-10-CM | POA: Diagnosis not present

## 2013-01-23 DIAGNOSIS — E039 Hypothyroidism, unspecified: Secondary | ICD-10-CM | POA: Diagnosis not present

## 2013-01-23 DIAGNOSIS — E109 Type 1 diabetes mellitus without complications: Secondary | ICD-10-CM | POA: Diagnosis not present

## 2013-01-23 DIAGNOSIS — E785 Hyperlipidemia, unspecified: Secondary | ICD-10-CM | POA: Diagnosis not present

## 2013-01-23 DIAGNOSIS — M949 Disorder of cartilage, unspecified: Secondary | ICD-10-CM | POA: Diagnosis not present

## 2013-01-23 DIAGNOSIS — M899 Disorder of bone, unspecified: Secondary | ICD-10-CM | POA: Diagnosis not present

## 2013-01-28 DIAGNOSIS — G4700 Insomnia, unspecified: Secondary | ICD-10-CM | POA: Diagnosis not present

## 2013-01-28 DIAGNOSIS — G4730 Sleep apnea, unspecified: Secondary | ICD-10-CM | POA: Diagnosis not present

## 2013-01-29 DIAGNOSIS — R0989 Other specified symptoms and signs involving the circulatory and respiratory systems: Secondary | ICD-10-CM | POA: Diagnosis not present

## 2013-01-29 DIAGNOSIS — Z006 Encounter for examination for normal comparison and control in clinical research program: Secondary | ICD-10-CM | POA: Diagnosis not present

## 2013-01-29 DIAGNOSIS — R0609 Other forms of dyspnea: Secondary | ICD-10-CM | POA: Diagnosis not present

## 2013-01-29 DIAGNOSIS — R5381 Other malaise: Secondary | ICD-10-CM | POA: Diagnosis not present

## 2013-01-29 DIAGNOSIS — E559 Vitamin D deficiency, unspecified: Secondary | ICD-10-CM | POA: Diagnosis not present

## 2013-02-02 ENCOUNTER — Ambulatory Visit
Admission: RE | Admit: 2013-02-02 | Discharge: 2013-02-02 | Disposition: A | Discharge status: Home / Self Care | Payer: Medicare Other | Source: Ambulatory Visit | Attending: Obstetrics & Gynecology | Admitting: Obstetrics & Gynecology

## 2013-02-02 DIAGNOSIS — R928 Other abnormal and inconclusive findings on diagnostic imaging of breast: Secondary | ICD-10-CM | POA: Diagnosis not present

## 2013-02-02 DIAGNOSIS — Z853 Personal history of malignant neoplasm of breast: Secondary | ICD-10-CM

## 2013-02-03 ENCOUNTER — Telehealth: Payer: Self-pay | Admitting: Oncology

## 2013-02-03 NOTE — Telephone Encounter (Signed)
4/23 appt moved to PM due to KK in [facility] in the AM. S/w pt she is aware.

## 2013-02-04 DIAGNOSIS — R748 Abnormal levels of other serum enzymes: Secondary | ICD-10-CM | POA: Diagnosis not present

## 2013-02-04 DIAGNOSIS — R112 Nausea with vomiting, unspecified: Secondary | ICD-10-CM | POA: Diagnosis not present

## 2013-02-04 DIAGNOSIS — E860 Dehydration: Secondary | ICD-10-CM | POA: Diagnosis not present

## 2013-02-04 DIAGNOSIS — H5440 Blindness, one eye, unspecified eye: Secondary | ICD-10-CM | POA: Diagnosis present

## 2013-02-04 DIAGNOSIS — R197 Diarrhea, unspecified: Secondary | ICD-10-CM | POA: Diagnosis not present

## 2013-02-04 DIAGNOSIS — G4733 Obstructive sleep apnea (adult) (pediatric): Secondary | ICD-10-CM | POA: Diagnosis not present

## 2013-02-04 DIAGNOSIS — D72829 Elevated white blood cell count, unspecified: Secondary | ICD-10-CM | POA: Diagnosis not present

## 2013-02-04 DIAGNOSIS — Z794 Long term (current) use of insulin: Secondary | ICD-10-CM | POA: Diagnosis not present

## 2013-02-04 DIAGNOSIS — I959 Hypotension, unspecified: Secondary | ICD-10-CM | POA: Diagnosis not present

## 2013-02-04 DIAGNOSIS — E039 Hypothyroidism, unspecified: Secondary | ICD-10-CM | POA: Diagnosis present

## 2013-02-04 DIAGNOSIS — Z79899 Other long term (current) drug therapy: Secondary | ICD-10-CM | POA: Diagnosis not present

## 2013-02-04 DIAGNOSIS — R569 Unspecified convulsions: Secondary | ICD-10-CM | POA: Diagnosis not present

## 2013-02-04 DIAGNOSIS — R55 Syncope and collapse: Secondary | ICD-10-CM | POA: Diagnosis not present

## 2013-02-04 DIAGNOSIS — E108 Type 1 diabetes mellitus with unspecified complications: Secondary | ICD-10-CM | POA: Diagnosis not present

## 2013-02-04 DIAGNOSIS — E1065 Type 1 diabetes mellitus with hyperglycemia: Secondary | ICD-10-CM | POA: Diagnosis present

## 2013-02-04 DIAGNOSIS — IMO0002 Reserved for concepts with insufficient information to code with codable children: Secondary | ICD-10-CM | POA: Diagnosis not present

## 2013-02-04 DIAGNOSIS — R1110 Vomiting, unspecified: Secondary | ICD-10-CM | POA: Diagnosis not present

## 2013-02-17 DIAGNOSIS — E785 Hyperlipidemia, unspecified: Secondary | ICD-10-CM | POA: Diagnosis not present

## 2013-02-17 DIAGNOSIS — E109 Type 1 diabetes mellitus without complications: Secondary | ICD-10-CM | POA: Diagnosis not present

## 2013-02-17 DIAGNOSIS — M949 Disorder of cartilage, unspecified: Secondary | ICD-10-CM | POA: Diagnosis not present

## 2013-02-17 DIAGNOSIS — Z9641 Presence of insulin pump (external) (internal): Secondary | ICD-10-CM | POA: Diagnosis not present

## 2013-02-17 DIAGNOSIS — E039 Hypothyroidism, unspecified: Secondary | ICD-10-CM | POA: Diagnosis not present

## 2013-02-17 DIAGNOSIS — Z23 Encounter for immunization: Secondary | ICD-10-CM | POA: Diagnosis not present

## 2013-02-18 ENCOUNTER — Other Ambulatory Visit (HOSPITAL_BASED_OUTPATIENT_CLINIC_OR_DEPARTMENT_OTHER): Payer: Medicare Other | Admitting: Lab

## 2013-02-18 ENCOUNTER — Telehealth: Payer: Self-pay | Admitting: *Deleted

## 2013-02-18 ENCOUNTER — Ambulatory Visit (HOSPITAL_BASED_OUTPATIENT_CLINIC_OR_DEPARTMENT_OTHER): Payer: Medicare Other | Admitting: Oncology

## 2013-02-18 ENCOUNTER — Encounter: Payer: Self-pay | Admitting: Oncology

## 2013-02-18 VITALS — BP 125/67 | HR 80 | Temp 97.8°F | Resp 20 | Ht 67.0 in | Wt 173.1 lb

## 2013-02-18 DIAGNOSIS — D0500 Lobular carcinoma in situ of unspecified breast: Secondary | ICD-10-CM

## 2013-02-18 DIAGNOSIS — D0590 Unspecified type of carcinoma in situ of unspecified breast: Secondary | ICD-10-CM | POA: Diagnosis not present

## 2013-02-18 DIAGNOSIS — N6099 Unspecified benign mammary dysplasia of unspecified breast: Secondary | ICD-10-CM

## 2013-02-18 LAB — CBC WITH DIFFERENTIAL/PLATELET
Basophils Absolute: 0 10*3/uL (ref 0.0–0.1)
Eosinophils Absolute: 0.1 10*3/uL (ref 0.0–0.5)
HCT: 39.6 % (ref 34.8–46.6)
HGB: 13.2 g/dL (ref 11.6–15.9)
LYMPH%: 14.9 % (ref 14.0–49.7)
MCV: 91.5 fL (ref 79.5–101.0)
MONO%: 11.8 % (ref 0.0–14.0)
NEUT#: 6.3 10*3/uL (ref 1.5–6.5)
NEUT%: 71.8 % (ref 38.4–76.8)
Platelets: 252 10*3/uL (ref 145–400)
RBC: 4.32 10*6/uL (ref 3.70–5.45)

## 2013-02-18 LAB — COMPREHENSIVE METABOLIC PANEL (CC13)
ALT: 23 U/L (ref 0–55)
CO2: 29 mEq/L (ref 22–29)
Calcium: 9.2 mg/dL (ref 8.4–10.4)
Chloride: 105 mEq/L (ref 98–107)
Creatinine: 1 mg/dL (ref 0.6–1.1)

## 2013-02-18 NOTE — Progress Notes (Signed)
OFFICE PROGRESS NOTE  CC Dr. 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  #2 she was begun on chemoprevention with tamoxifen 20 mg daily.  CURRENT THERAPY: Tamoxifen 20 mg daily since March 2012  INTERVAL HISTORY: April Long 67 y.o. female returns for Followup visit. Overall she is doing well.. No further vaginal bleeding has occurred.she remains on tamoxifen she has no him lumps bumps she denies any nausea vomiting fevers chills night sweats. No myalgias or arthralgias. Patient does use a CPAP for obstructive sleep apnea. Remainder of the 10 point review of systems is negative  MEDICAL HISTORY: Past Medical History  Diagnosis Date  . Diabetes mellitus     type 1, for almost 48 years  . Thyroid disease   . 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 Lobular carcinoma in JYNW(G9562/1) 11/29/2011  . Cancer   . Osteoporosis     early stage    ALLERGIES:  is allergic to sulfa drugs cross reactors.  MEDICATIONS:  Current Outpatient Prescriptions  Medication Sig Dispense Refill  . aspirin 81 MG tablet Take 81 mg by mouth daily.      Marland Kitchen atorvastatin (LIPITOR) 10 MG tablet Take 10 mg by mouth daily.      Marland Kitchen co-enzyme Q-10 30 MG capsule Take 100 mg by mouth 3 (three) times daily.      . Insulin Aspart (NOVOLOG [place]) Inject into the skin. Insulin pump       . Levothyroxine Sodium (SYNTHROID PO) Take by mouth.        Marland Kitchen lisinopril (PRINIVIL,ZESTRIL) 20 MG tablet Take 20 mg by mouth daily.        . Multiple Vitamin (MULTIVITAMIN) tablet Take 1 tablet by mouth daily.      Marland Kitchen OVER THE COUNTER MEDICATION Take by mouth 2 (two) times daily.      . tamoxifen (NOLVADEX) 20 MG tablet Take 1 tablet (20 mg total) by mouth daily.  90 tablet  12  . VITAMIN D,  CHOLECALCIFEROL, PO Take 1,000 Units by mouth.       . Coenzyme Q10 (CO Q-10 PO) Take by mouth as needed.      . tamoxifen (NOLVADEX) 20 MG tablet Take 20 mg by mouth daily.       No current facility-administered medications for this visit.    SURGICAL HISTORY:  Past Surgical History  Procedure Laterality Date  . Tubal ligation  1974  . Eye surgery  1983    retinal reattachment  . Breast surgery  2012    lumpectomy    REVIEW OF SYSTEMS:  Pertinent items are noted in HPI.   PHYSICAL EXAMINATION: General appearance: alert, cooperative and appears stated age Neck: no adenopathy, no carotid bruit, no JVD, supple, symmetrical, trachea midline and thyroid not enlarged, symmetric, no tenderness/mass/nodules Lymph nodes: Cervical, supraclavicular, and axillary nodes normal. Resp: clear to auscultation bilaterally and normal percussion bilaterally Back: symmetric, no curvature. ROM normal. No CVA tenderness. Cardio: regular rate and rhythm, S1, S2 normal, no murmur, click, rub or gallop GI: soft, non-tender; bowel sounds normal; no masses,  no organomegaly Extremities: extremities normal, atraumatic, no cyanosis or edema Neurologic: Alert and oriented X 3, normal strength and tone. Normal symmetric reflexes. Normal coordination and gait  Bilateral breast examination: Patient has no masses nipple discharge. Left breast reveals a well-healed surgical scar. No nodularity or masses ECOG PERFORMANCE STATUS: 1 - Symptomatic but completely ambulatory  Blood pressure 125/67, pulse 80, temperature 97.8 F (36.6 C), temperature source Oral, resp. rate 20, height 5\' 7"  (1.702 m), weight 173 lb 1.6 oz (78.518 kg).  LABORATORY DATA: Lab Results  Component Value Date   WBC 8.7 02/18/2013   HGB 13.2 02/18/2013   HCT 39.6 02/18/2013   MCV 91.5 02/18/2013   PLT 252 02/18/2013      Chemistry      Component Value Date/Time   NA 142 02/18/2013 1207   NA 140 11/29/2011 0907   K 4.1 02/18/2013 1207   K 5.3  11/29/2011 0907   CL 105 02/18/2013 1207   CL 102 11/29/2011 0907   CO2 29 02/18/2013 1207   CO2 27 11/29/2011 0907   BUN 20.1 02/18/2013 1207   BUN 28* 11/29/2011 0907   CREATININE 1.0 02/18/2013 1207   CREATININE 1.22* 11/29/2011 0907      Component Value Date/Time   CALCIUM 9.2 02/18/2013 1207   CALCIUM 9.4 11/29/2011 0907   ALKPHOS 42 02/18/2013 1207   ALKPHOS 36* 11/29/2011 0907   AST 22 02/18/2013 1207   AST 27 11/29/2011 0907   ALT 23 02/18/2013 1207   ALT 19 11/29/2011 0907   BILITOT 0.49 02/18/2013 1207   BILITOT 0.4 11/29/2011 0907       RADIOGRAPHIC STUDIES:  No results found.  ASSESSMENT: 67 year old female with lobular carcinoma in situ originally diagnosed in October 2012. Since then she has been on tamoxifen 20 mg daily overall she's doing well. Her mammograms are up-to-date.   PLAN:  #1 patient will continue tamoxifen 20 mg daily.  #2 I will see her back in 12 months time. She will call with any problems questions or concerns in the interim.  All questions were answered. The patient knows to call the clinic with any problems, questions or concerns. We can certainly see the patient much sooner if necessary.  I spent 25 minutes counseling the patient face to face. The total time spent in the appointment was 30 minutes.    Drue Second, MD Medical/Oncology [facility] [phone] (beeper) [phone] (Office)  02/18/2013, 1:16 PM

## 2013-02-18 NOTE — Patient Instructions (Addendum)
Doing well  i will now see you back once a year

## 2013-02-18 NOTE — Telephone Encounter (Signed)
appts made and printed...td 

## 2013-02-24 DIAGNOSIS — G4700 Insomnia, unspecified: Secondary | ICD-10-CM | POA: Diagnosis not present

## 2013-02-24 DIAGNOSIS — G4730 Sleep apnea, unspecified: Secondary | ICD-10-CM | POA: Diagnosis not present

## 2013-03-05 DIAGNOSIS — F0630 Mood disorder due to known physiological condition, unspecified: Secondary | ICD-10-CM | POA: Diagnosis not present

## 2013-03-05 DIAGNOSIS — F411 Generalized anxiety disorder: Secondary | ICD-10-CM | POA: Diagnosis not present

## 2013-03-10 DIAGNOSIS — Z23 Encounter for immunization: Secondary | ICD-10-CM | POA: Diagnosis not present

## 2013-03-26 DIAGNOSIS — F0630 Mood disorder due to known physiological condition, unspecified: Secondary | ICD-10-CM | POA: Diagnosis not present

## 2013-03-26 DIAGNOSIS — F411 Generalized anxiety disorder: Secondary | ICD-10-CM | POA: Diagnosis not present

## 2013-04-08 DIAGNOSIS — G4700 Insomnia, unspecified: Secondary | ICD-10-CM | POA: Diagnosis not present

## 2013-04-13 DIAGNOSIS — M899 Disorder of bone, unspecified: Secondary | ICD-10-CM | POA: Diagnosis not present

## 2013-04-13 DIAGNOSIS — E785 Hyperlipidemia, unspecified: Secondary | ICD-10-CM | POA: Diagnosis not present

## 2013-04-13 DIAGNOSIS — E039 Hypothyroidism, unspecified: Secondary | ICD-10-CM | POA: Diagnosis not present

## 2013-04-13 DIAGNOSIS — E109 Type 1 diabetes mellitus without complications: Secondary | ICD-10-CM | POA: Diagnosis not present

## 2013-04-13 DIAGNOSIS — Z9641 Presence of insulin pump (external) (internal): Secondary | ICD-10-CM | POA: Diagnosis not present

## 2013-04-13 DIAGNOSIS — M949 Disorder of cartilage, unspecified: Secondary | ICD-10-CM | POA: Diagnosis not present

## 2013-04-14 DIAGNOSIS — G4700 Insomnia, unspecified: Secondary | ICD-10-CM | POA: Diagnosis not present

## 2013-04-15 DIAGNOSIS — E109 Type 1 diabetes mellitus without complications: Secondary | ICD-10-CM | POA: Diagnosis not present

## 2013-04-15 DIAGNOSIS — L84 Corns and callosities: Secondary | ICD-10-CM | POA: Diagnosis not present

## 2013-04-20 DIAGNOSIS — M949 Disorder of cartilage, unspecified: Secondary | ICD-10-CM | POA: Diagnosis not present

## 2013-04-20 DIAGNOSIS — E785 Hyperlipidemia, unspecified: Secondary | ICD-10-CM | POA: Diagnosis not present

## 2013-04-20 DIAGNOSIS — E109 Type 1 diabetes mellitus without complications: Secondary | ICD-10-CM | POA: Diagnosis not present

## 2013-04-20 DIAGNOSIS — Z9641 Presence of insulin pump (external) (internal): Secondary | ICD-10-CM | POA: Diagnosis not present

## 2013-04-20 DIAGNOSIS — M899 Disorder of bone, unspecified: Secondary | ICD-10-CM | POA: Diagnosis not present

## 2013-04-20 DIAGNOSIS — E039 Hypothyroidism, unspecified: Secondary | ICD-10-CM | POA: Diagnosis not present

## 2013-04-22 DIAGNOSIS — F411 Generalized anxiety disorder: Secondary | ICD-10-CM | POA: Diagnosis not present

## 2013-04-22 DIAGNOSIS — F0630 Mood disorder due to known physiological condition, unspecified: Secondary | ICD-10-CM | POA: Diagnosis not present

## 2013-05-19 DIAGNOSIS — H3520 Other non-diabetic proliferative retinopathy, unspecified eye: Secondary | ICD-10-CM | POA: Diagnosis not present

## 2013-05-19 DIAGNOSIS — E119 Type 2 diabetes mellitus without complications: Secondary | ICD-10-CM | POA: Diagnosis not present

## 2013-05-19 DIAGNOSIS — E11359 Type 2 diabetes mellitus with proliferative diabetic retinopathy without macular edema: Secondary | ICD-10-CM | POA: Diagnosis not present

## 2013-05-20 DIAGNOSIS — H3340 Traction detachment of retina, unspecified eye: Secondary | ICD-10-CM

## 2013-05-20 HISTORY — DX: Traction detachment of retina, unspecified eye: H33.40

## 2013-06-03 DIAGNOSIS — F411 Generalized anxiety disorder: Secondary | ICD-10-CM | POA: Diagnosis not present

## 2013-06-03 DIAGNOSIS — F0630 Mood disorder due to known physiological condition, unspecified: Secondary | ICD-10-CM | POA: Diagnosis not present

## 2013-07-22 DIAGNOSIS — F0630 Mood disorder due to known physiological condition, unspecified: Secondary | ICD-10-CM | POA: Diagnosis not present

## 2013-07-22 DIAGNOSIS — E039 Hypothyroidism, unspecified: Secondary | ICD-10-CM | POA: Diagnosis not present

## 2013-07-22 DIAGNOSIS — F411 Generalized anxiety disorder: Secondary | ICD-10-CM | POA: Diagnosis not present

## 2013-07-22 DIAGNOSIS — E785 Hyperlipidemia, unspecified: Secondary | ICD-10-CM | POA: Diagnosis not present

## 2013-07-22 DIAGNOSIS — E109 Type 1 diabetes mellitus without complications: Secondary | ICD-10-CM | POA: Diagnosis not present

## 2013-07-24 DIAGNOSIS — E109 Type 1 diabetes mellitus without complications: Secondary | ICD-10-CM | POA: Diagnosis not present

## 2013-07-24 DIAGNOSIS — Z23 Encounter for immunization: Secondary | ICD-10-CM | POA: Diagnosis not present

## 2013-07-24 DIAGNOSIS — M899 Disorder of bone, unspecified: Secondary | ICD-10-CM | POA: Diagnosis not present

## 2013-07-24 DIAGNOSIS — E039 Hypothyroidism, unspecified: Secondary | ICD-10-CM | POA: Diagnosis not present

## 2013-07-24 DIAGNOSIS — E785 Hyperlipidemia, unspecified: Secondary | ICD-10-CM | POA: Diagnosis not present

## 2013-07-24 DIAGNOSIS — Z9641 Presence of insulin pump (external) (internal): Secondary | ICD-10-CM | POA: Diagnosis not present

## 2013-08-03 ENCOUNTER — Encounter: Payer: Self-pay | Admitting: Neurology

## 2013-08-03 ENCOUNTER — Ambulatory Visit (INDEPENDENT_AMBULATORY_CARE_PROVIDER_SITE_OTHER): Payer: Medicare Other | Admitting: Neurology

## 2013-08-03 VITALS — BP 110/62 | HR 65 | Temp 98.2°F | Ht 67.0 in | Wt 173.0 lb

## 2013-08-03 DIAGNOSIS — R55 Syncope and collapse: Secondary | ICD-10-CM

## 2013-08-03 DIAGNOSIS — R4789 Other speech disturbances: Secondary | ICD-10-CM | POA: Diagnosis not present

## 2013-08-03 DIAGNOSIS — G459 Transient cerebral ischemic attack, unspecified: Secondary | ICD-10-CM

## 2013-08-03 DIAGNOSIS — R4781 Slurred speech: Secondary | ICD-10-CM

## 2013-08-03 HISTORY — DX: Slurred speech: R47.81

## 2013-08-03 HISTORY — DX: Syncope and collapse: R55

## 2013-08-03 NOTE — Progress Notes (Signed)
Subjective:    Patient ID: April Long is a 67 y.o. female.  HPI  Huston Foley, MD, PhD [facility] [address], [address] [address] [place], [place] [address]  Dear Dr. Leslie Dales,   I saw your patient, April Long, upon your kind request, in my neurological clinic today for initial consultation of her syncope and slurring of speech. The patient is unaccompanied today. As you know, April Long, is a very pleasant 67 year-old right-handed woman with an underlying medical history of hyperlipidemia, hypothyroidism, osteopenia, OSA on CPAP, OA, stasis dermatitis, PAD, anxiety and CIS with s/p L partial mastectomy, and diabetes type I, L eye blindness, with Hx of bilateral eye hemorrhages (was told not to take ASA), who was hospitalized in April of this year at [facility] with 2 episodes of syncope preceded by nausea, vomiting and diarrhea resulting in dehydration. Her symptoms were attributed to a viral gastroenteritis. She has not had further syncopal spells since then. In July of this year she had an episode of slurring of speech, lasting 15-20 minutes, which was not associated with any other neurological accompaniment, nor HA. She had garbled speech , and was aware of what she wanted to say and understood her husband. She did not have associated hypoglycemia at the time and has full recollection of the episode. She had recent blood work through your office on 07/22/2013 which I reviewed: Her BUN was 39 and creatinine was 1.1, glucose was 375, total cholesterol was 159, triglycerides were 65, HDL 72 and LDL 74. Her hemoglobin A1c was 6.1, TSH was 0.4 and free T4 was 0.85. She has no Hx of diabetic neuropathy, she quit smoking 12 years ago, and she drinks wine socially. She has never had a CTH or MRI brain. She is not on a daily ASA. She has a FHx of AD in Mother, MU and older sister 65 y.o.), no brain aneurysm, no stroke, no DM type I.  She currently feels at  baseline.   Her Past Medical History Is Significant For: Past Medical History  Diagnosis Date  . Diabetes mellitus     type 1, for almost 48 years  . Thyroid disease   . 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 Cancer   . Osteoporosis     early stage  . Syncope 08/03/2013  . Slurring of speech 08/03/2013    Her Past Surgical History Is Significant For: Past Surgical History  Procedure Laterality Date  . Tubal ligation  1974  . Eye surgery  1983    retinal reattachment  . Breast surgery  2012    lumpectomy    Her Family History Is Significant For: Family History  Problem Relation Age of Onset  . Alzheimer's disease Mother   . Heart disease Father   . Heart failure Father     Her Social History Is Significant For: History   Social History  . Marital Status: Married    Spouse Name: N/A    Number of Children: N/A  . Years of Education: N/A   Social History Main Topics  . Smoking status: Former Smoker    Quit date: 12/19/2000  . Smokeless tobacco: Never Used  . Alcohol Use: 4.2 oz/week    7 Glasses of wine per week     Comment: 1 -2 glasses of wine daily or weekly  . Drug Use: No  .  Sexual Activity: Yes   Other Topics Concern  . None   Social History Narrative  . None    Her Allergies Are:  Allergies  Allergen Reactions  . Sulfa Drugs Cross Reactors Rash    On chest only  :   Her Current Medications Are:  Outpatient Encounter Prescriptions as of 08/03/2013  Medication Sig Dispense Refill  . atorvastatin (LIPITOR) 10 MG tablet Take 10 mg by mouth daily.      Marland Kitchen co-enzyme Q-10 30 MG capsule Take 100 mg by mouth 3 (three) times daily.      . Insulin Aspart (NOVOLOG [place]) Inject into the skin. Insulin pump       . Levothyroxine Sodium (SYNTHROID PO) Take by mouth.        Marland Kitchen lisinopril (PRINIVIL,ZESTRIL) 20 MG tablet Take 10 mg by mouth daily.       . Multiple  Vitamin (MULTIVITAMIN) tablet Take 2 tablets by mouth daily.       Marland Kitchen OVER THE COUNTER MEDICATION Take by mouth 2 (two) times daily.      . tamoxifen (NOLVADEX) 20 MG tablet Take 1 tablet (20 mg total) by mouth daily.  90 tablet  12  . VITAMIN D, CHOLECALCIFEROL, PO Take 1,000 Units by mouth.       . [DISCONTINUED] aspirin 81 MG tablet Take 81 mg by mouth daily.      . [DISCONTINUED] Coenzyme Q10 (CO Q-10 PO) Take by mouth as needed.      . [DISCONTINUED] tamoxifen (NOLVADEX) 20 MG tablet Take 20 mg by mouth daily.       No facility-administered encounter medications on file as of 08/03/2013.  : Review of Systems:  Out of a complete 14 point review of systems, all are reviewed and negative with the exception of these symptoms as listed below:   Review of Systems  Gastrointestinal:       Incontinence  Genitourinary:       Incontinence  Neurological: Positive for speech difficulty.  Psychiatric/Behavioral: Positive for dysphoric mood. The patient is nervous/anxious.     Objective:  Neurologic Exam  Physical Exam Physical Examination:   Filed Vitals:   08/03/13 0948  BP: 110/62  Pulse: 65  Temp: 98.2 F (36.8 C)    General Examination: The patient is a very pleasant 67 y.o. female in no acute distress. She appears well-developed and well-nourished and well groomed. She is mildly anxious appearing.  HEENT: Normocephalic, atraumatic, pupils are equal, round and reactive to light and accommodation. Funduscopic exam is normal with sharp disc margins noted. Extraocular tracking is good without limitation to gaze excursion or nystagmus noted. Normal smooth pursuit is noted. Hearing is grossly intact. Tympanic membranes are clear bilaterally. Face is symmetric with normal facial animation and normal facial sensation. Speech is clear with no dysarthria noted. There is no hypophonia. There is no lip, neck/head, jaw or voice tremor. Neck is supple with full range of passive and active motion.  There are no carotid bruits on auscultation. Oropharynx exam reveals: mild mouth dryness, adequate dental hygiene and mild airway crowding. Mallampati is class III. Tongue protrudes centrally and palate elevates symmetrically.   Chest: Clear to auscultation without wheezing, rhonchi or crackles noted.  Heart: S1+S2+0, regular and normal without murmurs, rubs or gallops noted.   Abdomen: Soft, non-tender and non-distended with normal bowel sounds appreciated on auscultation.  Extremities: There is 2+ pitting edema in the distal lower extremities bilaterally. Pedal pulses are difficult to palpate. She has stasis-like  changes with erythema.   Skin: Warm and dry with lichenification in the distal LEs. She has mild varicose veins.   Musculoskeletal: exam reveals no obvious joint deformities, tenderness or joint swelling or erythema.   Neurologically:  Mental status: The patient is awake, alert and oriented in all 4 spheres. Her memory, attention, language and knowledge are appropriate. There is no aphasia, agnosia, apraxia or anomia. Speech is clear with normal prosody and enunciation. Thought process is linear. Mood is congruent and affect is normal.  Cranial nerves are as described above under HEENT exam. In addition, shoulder shrug is normal with equal shoulder height noted. Motor exam: Normal bulk, strength and tone is noted. There is no drift, tremor or rebound. Romberg is negative. Reflexes are 2+ throughout. Toes are downgoing bilaterally. Fine motor skills are intact with normal finger taps, normal hand movements, normal rapid alternating patting, normal foot taps and normal foot agility.  Cerebellar testing shows no dysmetria or intention tremor on finger to nose testing. Heel to shin is unremarkable bilaterally. There is no truncal or gait ataxia.  Sensory exam is intact to light touch, pinprick, vibration, temperature sense and in the upper extremities but there is some evidence of decreased  pinprick sensation and temperature sense in both distal lower extremities.  Gait, station and balance are unremarkable. No veering to one side is noted. No leaning to one side is noted. Posture is age-appropriate and stance is narrow based. No problems turning are noted. She turns en bloc. Tandem walk is not possible. She has difficulty with toe and heel stance.               Assessment and Plan:   In summary, April Long is a very pleasant 67 y.o.-year old female with a history of hyperlipidemia, hypothyroidism, osteopenia, OSA on CPAP, OA, stasis dermatitis, PAD, anxiety and CIS with s/p L partial mastectomy, and diabetes type I, who had a transient episode of slurring of speech 3 months ago. Her vascular RF include, HLP, DM, PAD and OSA. She may have had a TIA, I am unable to exclude it at this time. Her physical exam is stable and non-focal for the most part, but there is some evidence of PN, likely diabetic. She currently feels at baseline.  I had a long chat with the patient about my findings and the diagnosis of TIA, the prognosis and treatment options. We talked about medical treatments and non-pharmacological approaches. We talked about maintaining a healthy lifestyle in general. I encouraged the patient to eat healthy, exercise daily and keep well hydrated, to keep a scheduled bedtime and wake time routine, to not skip any meals and eat healthy snacks in between meals and to have protein with every meal. We talked about secondary prevention. I asked the patient to check with her eye doctor whether she can take a daily baby aspirin for vascular prevention. She was in agreement and will call us back. As far as further diagnostic testing is concerned, I suggested the following today: carotid duplex, echocardiogram, MRI brain without contrast and MRA head, blood work (today).   As far as medications are concerned, I recommended the following at this time: no other change.  I answered all her  questions today and the patient was in agreement with the above outlined plan. I can probably see her back on an as-needed basis. We will be calling her with her test results. Again, she is going to call us back after she finds out about  being able to take a baby aspirin.  Thank you very much for allowing me to participate in the care of this nice patient. If I can be of any further assistance to you please do not hesitate to call me at [phone].  Sincerely,   Huston Foley, MD, PhD

## 2013-08-03 NOTE — Patient Instructions (Addendum)
I think overall you are doing fairly well but I do want to suggest a few things today:  Remember to drink plenty of fluid, eat healthy meals and do not skip any meals. Try to eat protein with a every meal and eat a healthy snack such as fruit or nuts in between meals. Try to keep a regular sleep-wake schedule and try to exercise daily, particularly in the form of walking, 20-30 minutes a day, if you can.   As far as your medications are concerned, I would like to suggest no changes, but I do want you to call your eye doctor and inquire whether you could take a daily baby aspirin.    As far as diagnostic testing: MRI brain, MRA head, echocardiogram, [place] of carotid arteries.   I would like to see you back in 3 months, sooner if we need to. Please call us with any interim questions, concerns, problems, updates or refill requests.  Please also call us for any test results so we can go over those with you on the phone. Brett Canales is my clinical assistant and will answer any of your questions and relay your messages to me and also relay most of my messages to you.  Our phone number is [phone]. We also have an after hours call service for urgent matters and there is a physician on-call for urgent questions. For any emergencies you know to call 911 or go to the nearest emergency room.

## 2013-08-04 LAB — RPR: RPR: NONREACTIVE

## 2013-08-04 LAB — PROTIME-INR: Prothrombin Time: 11.2 s (ref 9.1–12.0)

## 2013-08-04 NOTE — Progress Notes (Signed)
Quick Note:  Please call and advise the patient that the recent labs we checked were within normal limits. We checked inflammatory, infectious and autoimmune markers, all of which were normal. No further action is required on these tests at this time. Please remind patient to keep any upcoming appointments or tests and to call us with any interim questions, concerns, problems or updates. Thanks,  Huston Foley, MD, PhD    ______

## 2013-08-05 ENCOUNTER — Telehealth: Payer: Self-pay | Admitting: Neurology

## 2013-08-05 ENCOUNTER — Ambulatory Visit (INDEPENDENT_AMBULATORY_CARE_PROVIDER_SITE_OTHER): Payer: Medicare Other

## 2013-08-05 DIAGNOSIS — G459 Transient cerebral ischemic attack, unspecified: Secondary | ICD-10-CM | POA: Diagnosis not present

## 2013-08-05 DIAGNOSIS — R55 Syncope and collapse: Secondary | ICD-10-CM

## 2013-08-05 DIAGNOSIS — R4781 Slurred speech: Secondary | ICD-10-CM

## 2013-08-06 NOTE — Progress Notes (Signed)
Quick Note:  I called pt and LMVM for her with lab results (normal). She is to call back as needed or questions. ______

## 2013-08-06 NOTE — Telephone Encounter (Signed)
Requesting CMA-Steve returns her call. Will fwd message.

## 2013-08-07 ENCOUNTER — Ambulatory Visit
Admission: RE | Admit: 2013-08-07 | Discharge: 2013-08-07 | Disposition: A | Discharge status: Home / Self Care | Payer: Medicare Other | Source: Ambulatory Visit | Attending: Neurology | Admitting: Neurology

## 2013-08-07 DIAGNOSIS — R55 Syncope and collapse: Secondary | ICD-10-CM

## 2013-08-07 DIAGNOSIS — R4781 Slurred speech: Secondary | ICD-10-CM

## 2013-08-07 DIAGNOSIS — G459 Transient cerebral ischemic attack, unspecified: Secondary | ICD-10-CM

## 2013-08-07 DIAGNOSIS — R51 Headache: Secondary | ICD-10-CM | POA: Diagnosis not present

## 2013-08-07 DIAGNOSIS — R4701 Aphasia: Secondary | ICD-10-CM

## 2013-08-10 NOTE — Progress Notes (Signed)
Quick Note:  Please call patient regarding the recent brain MRI: The brain scan showed a normal structure of the brain and overall mild volume loss which we call atrophy. There is shrinkage of the brain expected with aging.  There were changes in the deeper structures of the brain, which we call white matter changes or microvascular changes. These were reported as mild in her case. These are tiny white spots, that occur with time and are seen in a variety of conditions, including with normal aging, chronic hypertension, chronic headaches, especially migraine HAs, chronic diabetes, chronic hyperlipidemia. These are not strokes and no mass or lesion was seen which is reassuring. Again, there were no acute findings, such as a stroke, or mass or blood products. No further action is required on this test at this time, other than re-enforcing the importance of good blood pressure control, good cholesterol control, good blood sugar control, and weight management.  Her brain MRA, which is an imaging test of her brain blood vessels was reported as normal, which is reassuring. Please remind patient to keep any upcoming appointments or tests and to call us with any interim questions, concerns, problems or updates. Thanks,  Huston Foley, MD, PhD    ______

## 2013-08-11 NOTE — Telephone Encounter (Signed)
I called pt and relayed the MRI, MRA and lab tests results to her.  Carotid US results not available yet.  She verbalized understanding.

## 2013-08-11 NOTE — Progress Notes (Signed)
Quick Note:  I called and relayed the results of MRI brain and MRA to pts cell VM as per below. She is to call back if questions. ______

## 2013-08-31 ENCOUNTER — Other Ambulatory Visit (HOSPITAL_COMMUNITY): Payer: Self-pay | Admitting: Neurology

## 2013-08-31 DIAGNOSIS — G459 Transient cerebral ischemic attack, unspecified: Secondary | ICD-10-CM

## 2013-08-31 DIAGNOSIS — R4781 Slurred speech: Secondary | ICD-10-CM

## 2013-08-31 DIAGNOSIS — R55 Syncope and collapse: Secondary | ICD-10-CM

## 2013-09-01 ENCOUNTER — Encounter: Payer: Self-pay | Admitting: Cardiology

## 2013-09-01 ENCOUNTER — Ambulatory Visit (HOSPITAL_COMMUNITY): Payer: Medicare Other | Attending: Cardiology | Admitting: Cardiology

## 2013-09-01 DIAGNOSIS — E119 Type 2 diabetes mellitus without complications: Secondary | ICD-10-CM | POA: Insufficient documentation

## 2013-09-01 DIAGNOSIS — G459 Transient cerebral ischemic attack, unspecified: Secondary | ICD-10-CM

## 2013-09-01 DIAGNOSIS — Z8673 Personal history of transient ischemic attack (TIA), and cerebral infarction without residual deficits: Secondary | ICD-10-CM | POA: Diagnosis not present

## 2013-09-01 DIAGNOSIS — Z87891 Personal history of nicotine dependence: Secondary | ICD-10-CM | POA: Diagnosis not present

## 2013-09-01 DIAGNOSIS — E785 Hyperlipidemia, unspecified: Secondary | ICD-10-CM | POA: Insufficient documentation

## 2013-09-01 DIAGNOSIS — R55 Syncope and collapse: Secondary | ICD-10-CM | POA: Diagnosis not present

## 2013-09-01 DIAGNOSIS — R4781 Slurred speech: Secondary | ICD-10-CM

## 2013-09-01 NOTE — Progress Notes (Signed)
Echo performed. 

## 2013-09-04 NOTE — Progress Notes (Signed)
Quick Note:  Shared results with husband per Dr Teofilo Pod Normal findings, he understood ______

## 2013-09-04 NOTE — Progress Notes (Signed)
Quick Note:  Please advise patient that her recent echocardiogram which is the ultrasound of the heart showed no significant or sinister abnormality. She had good left heart function. No clot was seen. Huston Foley, MD, PhD [facility] ([facility]  ______

## 2013-09-04 NOTE — Progress Notes (Signed)
Quick Note:  Please advise patient that her recent echocardiogram your heart ultrasound was reported as having no significant abnormality. There was no clot. Heart pump function was normal. Huston Foley, MD, PhD [facility] ([facility]  ______

## 2013-10-27 DIAGNOSIS — E039 Hypothyroidism, unspecified: Secondary | ICD-10-CM | POA: Diagnosis not present

## 2013-10-27 DIAGNOSIS — E785 Hyperlipidemia, unspecified: Secondary | ICD-10-CM | POA: Diagnosis not present

## 2013-10-27 DIAGNOSIS — E109 Type 1 diabetes mellitus without complications: Secondary | ICD-10-CM | POA: Diagnosis not present

## 2013-11-02 DIAGNOSIS — M899 Disorder of bone, unspecified: Secondary | ICD-10-CM | POA: Diagnosis not present

## 2013-11-02 DIAGNOSIS — M949 Disorder of cartilage, unspecified: Secondary | ICD-10-CM | POA: Diagnosis not present

## 2013-11-02 DIAGNOSIS — E785 Hyperlipidemia, unspecified: Secondary | ICD-10-CM | POA: Diagnosis not present

## 2013-11-02 DIAGNOSIS — Z9641 Presence of insulin pump (external) (internal): Secondary | ICD-10-CM | POA: Diagnosis not present

## 2013-11-02 DIAGNOSIS — E039 Hypothyroidism, unspecified: Secondary | ICD-10-CM | POA: Diagnosis not present

## 2013-11-02 DIAGNOSIS — E109 Type 1 diabetes mellitus without complications: Secondary | ICD-10-CM | POA: Diagnosis not present

## 2013-11-19 DIAGNOSIS — F411 Generalized anxiety disorder: Secondary | ICD-10-CM | POA: Diagnosis not present

## 2013-11-19 DIAGNOSIS — F0630 Mood disorder due to known physiological condition, unspecified: Secondary | ICD-10-CM | POA: Diagnosis not present

## 2013-11-24 DIAGNOSIS — E11319 Type 2 diabetes mellitus with unspecified diabetic retinopathy without macular edema: Secondary | ICD-10-CM | POA: Diagnosis not present

## 2013-11-24 DIAGNOSIS — E1039 Type 1 diabetes mellitus with other diabetic ophthalmic complication: Secondary | ICD-10-CM | POA: Diagnosis not present

## 2013-11-24 DIAGNOSIS — E11311 Type 2 diabetes mellitus with unspecified diabetic retinopathy with macular edema: Secondary | ICD-10-CM | POA: Diagnosis not present

## 2013-11-26 DIAGNOSIS — F411 Generalized anxiety disorder: Secondary | ICD-10-CM | POA: Diagnosis not present

## 2013-11-26 DIAGNOSIS — F0630 Mood disorder due to known physiological condition, unspecified: Secondary | ICD-10-CM | POA: Diagnosis not present

## 2013-11-30 ENCOUNTER — Encounter: Payer: Self-pay | Admitting: Neurology

## 2013-11-30 ENCOUNTER — Ambulatory Visit (INDEPENDENT_AMBULATORY_CARE_PROVIDER_SITE_OTHER): Payer: Medicare Other | Admitting: Neurology

## 2013-11-30 VITALS — BP 121/66 | HR 72 | Temp 99.3°F | Ht 67.0 in | Wt 176.0 lb

## 2013-11-30 DIAGNOSIS — R55 Syncope and collapse: Secondary | ICD-10-CM | POA: Diagnosis not present

## 2013-11-30 DIAGNOSIS — G459 Transient cerebral ischemic attack, unspecified: Secondary | ICD-10-CM

## 2013-11-30 NOTE — Progress Notes (Signed)
Subjective:    Patient ID: April Long is a 68 y.o. female.  HPI    Interim history:   April Long, is a very pleasant 68 year-old right-handed woman with an underlying medical history of hyperlipidemia, hypothyroidism, osteopenia, OSA on CPAP, OA, stasis dermatitis, PAD, anxiety and CIS with s/p L partial mastectomy, and diabetes type I, L eye blindness, with Hx of bilateral eye hemorrhages, who presents for followup consultation of her syncopal spells as well as concern for recent TIA. She is unaccompanied by today. I first met her on 08/03/2013, which time she reported being hospitalized at [facility] in 4/14, with 2 episodes of syncope preceded by nausea, vomiting and diarrhea resulting in dehydration. Her symptoms were attributed to a viral gastroenteritis. She had not had further syncopal spells since then. She also reported, that in July 2014, she had an episode of slurring of speech, lasting 15-20 minutes, which was not associated with any other neurological accompaniment, nor HA. She had garbled speech , and was aware of what she wanted to say and understood her husband. She did not have associated hypoglycemia at the time and had full recollection of the episode. She had recent blood work through her PCP on 07/22/2013: BUN was 39, creatinine 1.1, glucose was 375, total cholesterol was 159, triglycerides were 65, HDL 72 and LDL 74. Her hemoglobin A1c was 6.1, TSH was 0.4 and free T4 was 0.85.  At the time of her visit in October with me I explained, that we could not exclude a TIA in addition to her syncopal spells. Her vascular risk factors include hyperlipidemia, diabetes, peripheral artery disease and sleep apnea. I suggested further workup with carotid Doppler studies, echocardiogram, MRI brain without contrast and MR a head as well as blood work. I suggested no new medications. Her brain MRA from 08/07/2013 without contrast showed: no significant stenosis of the medium to large  size intracranial vessels. Hypoplastic terminal right vertebral artery, hypoplastic A1 segment of the right anterior cerebral artery and persistent bilateral fetal origin of both posterior cerebral arteries are benign birth variants. In addition, personally reviewed the images through the PACS system. Her brain MRI from 08/07/2013 without contrast showed: Abnormal MRI scan the brain showing mild changes of chronic microvascular ischemia and generalized cerebral atrophy. In addition I reviewed the images through the PACS system. Her carotid Doppler study from 08/05/2013 showed no hemodynamically significant stenosis in either internal carotid artery. Her echocardiogram from 09/01/2013 showed an EF of 60-65%, very mild aortic stenosis, and mild left atrial dilatation, no sinister findings. We called and talked to her husband with the test results. Labs from 08/03/2013 showed normal CRP, normal ANA, normal INR, normal ESR, normal RPR. We called her with the test results, including her blood work results, and imaging test results. Today, she feels well, and has not had any recent syncopal or pre-syncopal spell or any slurring of speech or any new neurological Sx.  She has no Hx of diabetic neuropathy, she quit smoking 12 years ago, and she drinks wine socially. She is tolerating a baby ASA and uses CPAP regularly.   Her Past Medical History Is Significant For: Past Medical History  Diagnosis Date  . Diabetes mellitus     type 1, for almost 48 years  . Thyroid disease   . 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 Cancer   . Osteoporosis  early stage  . Syncope 08/03/2013  . Slurring of speech 08/03/2013    Her Past Surgical History Is Significant For: Past Surgical History  Procedure Laterality Date  . Tubal ligation  1974  . Eye surgery  1983    retinal reattachment  . Breast surgery  2012     lumpectomy    Her Family History Is Significant For: Family History  Problem Relation Age of Onset  . Alzheimer's disease Mother   . Heart disease Father   . Heart failure Father     Her Social History Is Significant For: History   Social History  . Marital Status: Married    Spouse Name: N/A    Number of Children: N/A  . Years of Education: N/A   Social History Main Topics  . Smoking status: Former Smoker    Quit date: 12/19/2000  . Smokeless tobacco: Never Used  . Alcohol Use: 4.2 oz/week    7 Glasses of wine per week     Comment: 1 -2 glasses of wine daily or weekly  . Drug Use: No  . Sexual Activity: Yes   Other Topics Concern  . None   Social History Narrative  . None    Her Allergies Are:  Allergies  Allergen Reactions  . Sulfa Drugs Cross Reactors Rash    On chest only  :   Her Current Medications Are:  Outpatient Encounter Prescriptions as of 11/30/2013  Medication Sig  . aspirin 81 MG tablet Take 81 mg by mouth daily.  Marland Kitchen atorvastatin (LIPITOR) 10 MG tablet Take 10 mg by mouth daily.  Marland Kitchen co-enzyme Q-10 30 MG capsule Take 100 mg by mouth 3 (three) times daily.  . Insulin Aspart (NOVOLOG [place]) Inject into the skin. Insulin pump   . Levothyroxine Sodium (SYNTHROID PO) Take by mouth.    Marland Kitchen lisinopril (PRINIVIL,ZESTRIL) 20 MG tablet Take 10 mg by mouth daily.   . Multiple Vitamin (MULTIVITAMIN) tablet Take 2 tablets by mouth daily.   Marland Kitchen OVER THE COUNTER MEDICATION Take by mouth 2 (two) times daily.  . tamoxifen (NOLVADEX) 20 MG tablet Take 1 tablet (20 mg total) by mouth daily.  Marland Kitchen VITAMIN D, CHOLECALCIFEROL, PO Take 1,000 Units by mouth.   . citalopram (CELEXA) 10 MG tablet Take 10 mg by mouth daily.  :  Review of Systems:  Out of a complete 14 point review of systems, all are reviewed and negative with the exception of these symptoms as listed below:   Review of Systems  Constitutional: Negative.   HENT: Negative.   Eyes: Negative.   Respiratory:  Negative.   Cardiovascular: Negative.   Gastrointestinal: Negative.   Endocrine: Negative.   Genitourinary: Negative.   Musculoskeletal: Negative.   Skin: Negative.   Allergic/Immunologic: Negative.   Neurological: Positive for speech difficulty.  Hematological: Negative.   Psychiatric/Behavioral: Negative.     Objective:  Neurologic Exam  Physical Exam Physical Examination:   Filed Vitals:   11/30/13 1219  BP: 121/66  Pulse: 72  Temp: 99.3 F (37.4 C)    General Examination: The patient is a very pleasant 68 y.o. female in no acute distress. She appears well-developed and well-nourished and well groomed. She is mildly anxious appearing.  HEENT: Normocephalic, atraumatic, pupils are anisocoric and round and reactive to light on the R only. She is blind on the OS with movements only noted on that side. Funduscopic exam is normal with sharp disc margins noted on the R, and cannot see the fundus  on the L. Extraocular tracking is good without limitation to gaze excursion or nystagmus noted. Normal smooth pursuit is noted. Hearing is grossly intact. Face is symmetric with normal facial animation and normal facial sensation. Speech is clear with no dysarthria noted. There is no hypophonia. There is no lip, neck/head, jaw or voice tremor. Neck is supple with full range of passive and active motion. There are no carotid bruits on auscultation. Oropharynx exam reveals: mild mouth dryness, adequate dental hygiene and mild airway crowding. Mallampati is class III. Tongue protrudes centrally and palate elevates symmetrically.   Chest: Clear to auscultation without wheezing, rhonchi or crackles noted.  Heart: S1+S2+0, regular and normal without murmurs, rubs or gallops noted.   Abdomen: Soft, non-tender and non-distended with normal bowel sounds appreciated on auscultation.  Extremities: There is 2+ pitting edema in the distal lower extremities bilaterally. Pedal pulses are difficult to  palpate. She has stasis-like changes with erythema and thick lichenified skin, also to a lesser degree in the dorsi of her hands.   Skin: Warm and dry with lichenification in the distal LEs. She has mild varicose veins.   Musculoskeletal: exam reveals no obvious joint deformities, tenderness or joint swelling or erythema.   Neurologically:  Mental status: The patient is awake, alert and oriented in all 4 spheres. Her memory, attention, language and knowledge are appropriate. There is no aphasia, agnosia, apraxia or anomia. Speech is clear with normal prosody and enunciation. Thought process is linear. Mood is congruent and affect is normal.  Cranial nerves are as described above under HEENT exam. In addition, shoulder shrug is normal with equal shoulder height noted. Motor exam: Normal bulk, strength and tone is noted. There is no drift, tremor or rebound. Romberg is negative. Reflexes are 2+ throughout. Toes are downgoing bilaterally. Fine motor skills are intact with normal finger taps, normal hand movements, normal rapid alternating patting, normal foot taps and normal foot agility.  Cerebellar testing shows no dysmetria or intention tremor on finger to nose testing. Heel to shin is unremarkable bilaterally. There is no truncal or gait ataxia.  Sensory exam is intact to light touch, pinprick, vibration, temperature sense and in the upper extremities but there is some evidence of decreased pinprick sensation and temperature sense in both distal lower extremities.  Gait, station and balance are unremarkable. No veering to one side is noted. No leaning to one side is noted. Posture is age-appropriate and stance is narrow based. No problems turning are noted. She turns en bloc. Tandem walk is difficult for her. There is intact toe and heel stance.               Assessment and Plan:   In summary, April Long is a very pleasant 68 year old female with a history of hyperlipidemia, hypothyroidism,  osteopenia, OSA on CPAP, OA, stasis dermatitis, PAD, anxiety and CIS with s/p L partial mastectomy, and diabetes type I, who had a transient episode of slurring of speech 3 months ago. She had a syncopal spell in the context of dehydration in the recent past. She currently feels at baseline and has not had any recent presyncopal spell, syncopal spell or any other focal neurologic event or symptom. Her vascular RF include, HLP, DM, PAD and OSA. She may have had a TIA, I am unable to exclude it and she had a fairly unremarkable TIA workup. Her physical exam is stable.  I had a long chat with the patient about my findings and the diagnosis of  TIA, the prognosis and treatment options. We talked about medical treatments and non-pharmacological approaches. We talked about maintaining a healthy lifestyle in general. I encouraged the patient to eat healthy, exercise daily and keep well hydrated, to keep a scheduled bedtime and wake time routine, to not skip any meals and eat healthy snacks in between meals and to have protein with every meal. We talked about secondary prevention. I asked the patient to check with her eye doctor whether she can take a daily baby aspirin for vascular prevention. She was in agreement and she is advised to stay better hydrated by drinking water. We talked about her recent test results including her carotid Doppler study, echocardiogram, MRI brain and MRA head, as well as her blood work today.  As far as medications are concerned, I recommended the following at this time: no other change.  I answered all her questions today and the patient was in agreement with the above outlined plan. I can see her back on an as-needed basis.

## 2013-11-30 NOTE — Patient Instructions (Addendum)
Continue your medications as directed. As discussed, secondary prevention is key after a stroke/TIA or a suspected TIA. This means: taking care of blood sugar values or diabetes management, good blood pressure (hypertension) control and optimizing cholesterol management, exercising daily or regularly within your own mobility limitations of course and overall cardiovascular risk factor reduction, which includes screening for and treatment of obstructive sleep apnea (OSA).   Please start exercising regularly. Please increase your water intake.

## 2013-12-16 DIAGNOSIS — F411 Generalized anxiety disorder: Secondary | ICD-10-CM | POA: Diagnosis not present

## 2013-12-16 DIAGNOSIS — F0630 Mood disorder due to known physiological condition, unspecified: Secondary | ICD-10-CM | POA: Diagnosis not present

## 2013-12-21 DIAGNOSIS — F0630 Mood disorder due to known physiological condition, unspecified: Secondary | ICD-10-CM | POA: Diagnosis not present

## 2013-12-21 DIAGNOSIS — F411 Generalized anxiety disorder: Secondary | ICD-10-CM | POA: Diagnosis not present

## 2013-12-24 ENCOUNTER — Telehealth: Payer: Self-pay | Admitting: Oncology

## 2013-12-24 NOTE — Telephone Encounter (Signed)
, °

## 2014-01-08 DIAGNOSIS — H3520 Other non-diabetic proliferative retinopathy, unspecified eye: Secondary | ICD-10-CM | POA: Diagnosis not present

## 2014-01-08 DIAGNOSIS — H3340 Traction detachment of retina, unspecified eye: Secondary | ICD-10-CM | POA: Diagnosis not present

## 2014-01-08 DIAGNOSIS — E11359 Type 2 diabetes mellitus with proliferative diabetic retinopathy without macular edema: Secondary | ICD-10-CM | POA: Diagnosis not present

## 2014-01-08 DIAGNOSIS — E119 Type 2 diabetes mellitus without complications: Secondary | ICD-10-CM | POA: Diagnosis not present

## 2014-01-11 DIAGNOSIS — I8310 Varicose veins of unspecified lower extremity with inflammation: Secondary | ICD-10-CM | POA: Diagnosis not present

## 2014-01-11 DIAGNOSIS — I70209 Unspecified atherosclerosis of native arteries of extremities, unspecified extremity: Secondary | ICD-10-CM | POA: Diagnosis not present

## 2014-01-11 DIAGNOSIS — M7989 Other specified soft tissue disorders: Secondary | ICD-10-CM | POA: Diagnosis not present

## 2014-01-11 DIAGNOSIS — R0989 Other specified symptoms and signs involving the circulatory and respiratory systems: Secondary | ICD-10-CM | POA: Diagnosis not present

## 2014-01-11 DIAGNOSIS — E109 Type 1 diabetes mellitus without complications: Secondary | ICD-10-CM | POA: Diagnosis not present

## 2014-01-25 ENCOUNTER — Other Ambulatory Visit: Payer: Self-pay | Admitting: Obstetrics and Gynecology

## 2014-01-25 DIAGNOSIS — N959 Unspecified menopausal and perimenopausal disorder: Secondary | ICD-10-CM | POA: Diagnosis not present

## 2014-01-25 DIAGNOSIS — M899 Disorder of bone, unspecified: Secondary | ICD-10-CM | POA: Diagnosis not present

## 2014-01-25 DIAGNOSIS — Z853 Personal history of malignant neoplasm of breast: Secondary | ICD-10-CM

## 2014-01-25 DIAGNOSIS — Z124 Encounter for screening for malignant neoplasm of cervix: Secondary | ICD-10-CM | POA: Diagnosis not present

## 2014-01-25 DIAGNOSIS — M949 Disorder of cartilage, unspecified: Secondary | ICD-10-CM | POA: Diagnosis not present

## 2014-01-25 DIAGNOSIS — Z1231 Encounter for screening mammogram for malignant neoplasm of breast: Secondary | ICD-10-CM

## 2014-02-09 ENCOUNTER — Ambulatory Visit
Admission: RE | Admit: 2014-02-09 | Discharge: 2014-02-09 | Disposition: A | Discharge status: Home / Self Care | Payer: Medicare Other | Source: Ambulatory Visit | Attending: Obstetrics and Gynecology | Admitting: Obstetrics and Gynecology

## 2014-02-09 ENCOUNTER — Encounter (INDEPENDENT_AMBULATORY_CARE_PROVIDER_SITE_OTHER): Payer: Self-pay

## 2014-02-09 DIAGNOSIS — Z1231 Encounter for screening mammogram for malignant neoplasm of breast: Secondary | ICD-10-CM | POA: Diagnosis not present

## 2014-02-09 DIAGNOSIS — Z853 Personal history of malignant neoplasm of breast: Secondary | ICD-10-CM

## 2014-02-12 DIAGNOSIS — I739 Peripheral vascular disease, unspecified: Secondary | ICD-10-CM | POA: Diagnosis not present

## 2014-02-12 DIAGNOSIS — M7989 Other specified soft tissue disorders: Secondary | ICD-10-CM | POA: Diagnosis not present

## 2014-02-15 DIAGNOSIS — I8310 Varicose veins of unspecified lower extremity with inflammation: Secondary | ICD-10-CM | POA: Diagnosis not present

## 2014-02-15 DIAGNOSIS — I70209 Unspecified atherosclerosis of native arteries of extremities, unspecified extremity: Secondary | ICD-10-CM | POA: Diagnosis not present

## 2014-02-15 DIAGNOSIS — M7989 Other specified soft tissue disorders: Secondary | ICD-10-CM | POA: Diagnosis not present

## 2014-02-18 ENCOUNTER — Ambulatory Visit (HOSPITAL_BASED_OUTPATIENT_CLINIC_OR_DEPARTMENT_OTHER): Payer: Medicare Other | Admitting: Adult Health

## 2014-02-18 ENCOUNTER — Ambulatory Visit: Payer: Medicare Other | Admitting: Oncology

## 2014-02-18 ENCOUNTER — Encounter: Payer: Self-pay | Admitting: Adult Health

## 2014-02-18 ENCOUNTER — Ambulatory Visit: Payer: Medicare Other | Admitting: Adult Health

## 2014-02-18 VITALS — BP 117/66 | HR 82 | Temp 98.3°F | Resp 20 | Ht 67.0 in | Wt 176.0 lb

## 2014-02-18 DIAGNOSIS — D0500 Lobular carcinoma in situ of unspecified breast: Secondary | ICD-10-CM

## 2014-02-18 DIAGNOSIS — Z170 Estrogen receptor positive status [ER+]: Secondary | ICD-10-CM | POA: Diagnosis not present

## 2014-02-18 DIAGNOSIS — E039 Hypothyroidism, unspecified: Secondary | ICD-10-CM | POA: Diagnosis not present

## 2014-02-18 DIAGNOSIS — N6099 Unspecified benign mammary dysplasia of unspecified breast: Secondary | ICD-10-CM

## 2014-02-18 DIAGNOSIS — E785 Hyperlipidemia, unspecified: Secondary | ICD-10-CM | POA: Diagnosis not present

## 2014-02-18 DIAGNOSIS — D0590 Unspecified type of carcinoma in situ of unspecified breast: Secondary | ICD-10-CM

## 2014-02-18 DIAGNOSIS — E109 Type 1 diabetes mellitus without complications: Secondary | ICD-10-CM | POA: Diagnosis not present

## 2014-02-18 NOTE — Progress Notes (Deleted)
OFFICE PROGRESS NOTE  CC Dr. 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  #2 she was begun on chemoprevention with tamoxifen 20 mg daily.  CURRENT THERAPY: Tamoxifen 20 mg daily since March 2012  INTERVAL HISTORY: April Long 68 y.o. female returns for Followup visit. Overall she is doing well.. No further vaginal bleeding has occurred.she remains on tamoxifen she has no him lumps bumps she denies any nausea vomiting fevers chills night sweats. No myalgias or arthralgias. Patient does use a CPAP for obstructive sleep apnea. Remainder of the 10 point review of systems is negative  MEDICAL HISTORY: Past Medical History  Diagnosis Date  . Diabetes mellitus     type 1, for almost 48 years  . Thyroid disease   . 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 Cancer   . Osteoporosis     early stage  . Syncope 08/03/2013  . Slurring of speech 08/03/2013    ALLERGIES:  is allergic to sulfa drugs cross reactors.  MEDICATIONS:  Current Outpatient Prescriptions  Medication Sig Dispense Refill  . aspirin 81 MG tablet Take 81 mg by mouth daily.      Marland Kitchen atorvastatin (LIPITOR) 10 MG tablet Take 10 mg by mouth daily.      . citalopram (CELEXA) 10 MG tablet Take 10 mg by mouth daily.      Marland Kitchen co-enzyme Q-10 30 MG capsule Take 100 mg by mouth 3 (three) times daily.      . Insulin Aspart (NOVOLOG [place]) Inject into the skin. Insulin pump       . Levothyroxine Sodium (SYNTHROID PO) Take by mouth.        Marland Kitchen lisinopril (PRINIVIL,ZESTRIL) 20 MG tablet Take 10 mg by mouth daily.       . Multiple Vitamin (MULTIVITAMIN) tablet Take 2 tablets by mouth daily.       Marland Kitchen OVER THE COUNTER MEDICATION Take by mouth 2 (two) times daily.       . tamoxifen (NOLVADEX) 20 MG tablet Take 1 tablet (20 mg total) by mouth daily.  90 tablet  12  . VITAMIN D, CHOLECALCIFEROL, PO Take 1,000 Units by mouth.        No current facility-administered medications for this visit.    SURGICAL HISTORY:  Past Surgical History  Procedure Laterality Date  . Tubal ligation  1974  . Eye surgery  1983    retinal reattachment  . Breast surgery  2012    lumpectomy    REVIEW OF SYSTEMS:  Pertinent items are noted in HPI.   PHYSICAL EXAMINATION: General appearance: alert, cooperative and appears stated age Neck: no adenopathy, no carotid bruit, no JVD, supple, symmetrical, trachea midline and thyroid not enlarged, symmetric, no tenderness/mass/nodules Lymph nodes: Cervical, supraclavicular, and axillary nodes normal. Resp: clear to auscultation bilaterally and normal percussion bilaterally Back: symmetric, no curvature. ROM normal. No CVA tenderness. Cardio: regular rate and rhythm, S1, S2 normal, no murmur, click, rub or gallop GI: soft, non-tender; bowel sounds normal; no masses,  no organomegaly Extremities: extremities normal, atraumatic, no cyanosis or edema Neurologic: Alert and oriented X 3, normal strength and tone. Normal symmetric reflexes. Normal coordination and gait Bilateral breast examination: Patient has no  masses nipple discharge. Left breast reveals a well-healed surgical scar. No nodularity or masses ECOG PERFORMANCE STATUS: 1 - Symptomatic but completely ambulatory  Blood pressure 117/66, pulse 82, temperature 98.3 F (36.8 C), temperature source Oral, resp. rate 20, height 5\' 7"  (1.702 m), weight 176 lb (79.833 kg).  LABORATORY DATA: Lab Results  Component Value Date   WBC 8.7 02/18/2013   HGB 13.2 02/18/2013   HCT 39.6 02/18/2013   MCV 91.5 02/18/2013   PLT 252 02/18/2013      Chemistry      Component Value Date/Time   NA 142 02/18/2013 1207   NA 140 11/29/2011 0907   K 4.1 02/18/2013 1207   K 5.3 11/29/2011 0907   CL  105 02/18/2013 1207   CL 102 11/29/2011 0907   CO2 29 02/18/2013 1207   CO2 27 11/29/2011 0907   BUN 20.1 02/18/2013 1207   BUN 28* 11/29/2011 0907   CREATININE 1.0 02/18/2013 1207   CREATININE 1.22* 11/29/2011 0907      Component Value Date/Time   CALCIUM 9.2 02/18/2013 1207   CALCIUM 9.4 11/29/2011 0907   ALKPHOS 42 02/18/2013 1207   ALKPHOS 36* 11/29/2011 0907   AST 22 02/18/2013 1207   AST 27 11/29/2011 0907   ALT 23 02/18/2013 1207   ALT 19 11/29/2011 0907   BILITOT 0.49 02/18/2013 1207   BILITOT 0.4 11/29/2011 0907       RADIOGRAPHIC STUDIES:  No results found.  ASSESSMENT: 68 year old female with lobular carcinoma in situ originally diagnosed in October 2012. Since then she has been on tamoxifen 20 mg daily overall she's doing well. Her mammograms are up-to-date.   PLAN:  #1 patient will continue tamoxifen 20 mg daily.  #2 I will see her back in 12 months time. She will call with any problems questions or concerns in the interim.  All questions were answered. The patient knows to call the clinic with any problems, questions or concerns. We can certainly see the patient much sooner if necessary.  I spent 25 minutes counseling the patient face to face. The total time spent in the appointment was 30 minutes.   Minette Headland, [place] [phone]  02/18/2014, 1:58 PM

## 2014-02-21 NOTE — Progress Notes (Signed)
Hematology and Oncology Follow Up Visit  April Long [id number] [birth year] 68 y.o. 02/21/2014 12:22 PM     Principle Diagnosis:April Long 68 y.o. 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    Prior Therapy:  1.  Patient is s/p lumpectomy on 12/06/2010 that demonstrated 1.8 cm of lobular carcinoma in situ, ER 100%, PR 59%.    2.  Patient was started on chemoprevention with Tamoxifen 20mg  daily in March of 2012.    Current therapy:  Tamoxifen 20mg  daily  Interim History: April Long 68 y.o. 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  She did undergo lumpectomy and was subsequently started on Tamoxifen daily.  She is taking this medication every day.  She is tolerating it very well.  She denies fevers, chills, hot flashes, joint aches, new pain, or any further concerns.  She does not perform montly self breast exams and she does exercise at the [facility] approximately 30 minutes most days of the week.  She did have an episode of slurred speech in October, 2014.  She went through an extensive neurological work up and has f/u with Dr. Rexene Alberts.  A work up was inconclusive and couldn't rule in or out a TIA.  She is planning on starting weight watchers this week.  Otherwise, she is doing well and a 10 point ROS is negative.  We reviewed her health maintenance below.    Medications:  Current Outpatient Prescriptions  Medication Sig Dispense Refill  . atorvastatin (LIPITOR) 10 MG tablet Take 10 mg by mouth daily.      Marland Kitchen co-enzyme Q-10 30 MG capsule Take 100 mg by mouth daily.       . Insulin Aspart (NOVOLOG [place]) Inject into the skin. Insulin pump       . Levothyroxine Sodium (SYNTHROID PO) Take by mouth.        Marland Kitchen lisinopril (PRINIVIL,ZESTRIL) 20 MG tablet Take 10 mg by mouth daily.       . Multiple Vitamin (MULTIVITAMIN) tablet Take 2 tablets by mouth daily.       . tamoxifen (NOLVADEX) 20 MG tablet Take 1 tablet (20 mg total) by mouth daily.  90 tablet  12  . VITAMIN D,  CHOLECALCIFEROL, PO Take 1,000 Units by mouth.       . citalopram (CELEXA) 10 MG tablet Take 10 mg by mouth daily.      Marland Kitchen OVER THE COUNTER MEDICATION Take by mouth 2 (two) times daily.       No current facility-administered medications for this visit.     Allergies:  Allergies  Allergen Reactions  . Sulfa Drugs Cross Reactors Rash    On chest only    Medical History: Past Medical History  Diagnosis Date  . Diabetes mellitus     type 1, for almost 48 years  . Thyroid disease   . 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 Cancer   . Osteoporosis     early stage  . Syncope 08/03/2013  . Slurring of speech 08/03/2013    Surgical History:  Past Surgical History  Procedure Laterality Date  . Tubal ligation  1974  . Eye surgery  1983    retinal reattachment  . Breast surgery  2012    lumpectomy     Review of Systems: A 10 point review of systems was conducted and is otherwise negative  except for what is noted above.    Health Maintenance  Mammogram:  02/09/14, normal Colonoscopy:5 years ago with 10 year f/u recommended Pap Smear: 2014  Physical Exam: Blood pressure 117/66, pulse 82, temperature 98.3 F (36.8 C), temperature source Oral, resp. rate 20, height 5\' 7"  (1.702 m), weight 176 lb (79.833 kg). GENERAL: Patient is a well appearing female in no acute distress HEENT:  Sclerae anicteric.  Oropharynx clear and moist. No ulcerations or evidence of oropharyngeal candidiasis. Neck is supple.  NODES:  No cervical, supraclavicular, or axillary lymphadenopathy palpated.  BREAST EXAM:  Left breast s/p lumpectomy, no nodularity, skin changes, breast nodules/masses, right breast no masses, nodules, skin changes, benign bilateral breast exam.   LUNGS:  Clear to auscultation bilaterally.  No wheezes or rhonchi. HEART:  Regular rate and rhythm. No murmur appreciated. ABDOMEN:  Soft,  nontender.  Positive, normoactive bowel sounds. No organomegaly palpated. MSK:  No focal spinal tenderness to palpation. Full range of motion bilaterally in the upper extremities. EXTREMITIES:  No peripheral edema.   SKIN:  Clear with no obvious rashes or skin changes. No nail dyscrasia. NEURO:  Nonfocal. Well oriented.  Appropriate affect. ECOG PERFORMANCE STATUS: 0 - Asymptomatic   Lab Results: Lab Results  Component Value Date   WBC 8.7 02/18/2013   HGB 13.2 02/18/2013   HCT 39.6 02/18/2013   MCV 91.5 02/18/2013   PLT 252 02/18/2013     Chemistry      Component Value Date/Time   NA 142 02/18/2013 1207   NA 140 11/29/2011 0907   K 4.1 02/18/2013 1207   K 5.3 11/29/2011 0907   CL 105 02/18/2013 1207   CL 102 11/29/2011 0907   CO2 29 02/18/2013 1207   CO2 27 11/29/2011 0907   BUN 20.1 02/18/2013 1207   BUN 28* 11/29/2011 0907   CREATININE 1.0 02/18/2013 1207   CREATININE 1.22* 11/29/2011 0907      Component Value Date/Time   CALCIUM 9.2 02/18/2013 1207   CALCIUM 9.4 11/29/2011 0907   ALKPHOS 42 02/18/2013 1207   ALKPHOS 36* 11/29/2011 0907   AST 22 02/18/2013 1207   AST 27 11/29/2011 0907   ALT 23 02/18/2013 1207   ALT 19 11/29/2011 0907   BILITOT 0.49 02/18/2013 1207   BILITOT 0.4 11/29/2011 0907     Assessment and Plan: April Long 68 y.o. female with  1. LCIS in 2012, started on Tamoxifen therapy as chemoprevention in March, 2012.  She is tolerating the Tamoxifen daily very well and will continue this.  We did discuss her high risk status.  She is up to date with her mammograms.  I counseled her modifiable risk factors such as a healthy diet, regular cardiovascular exercise, decreasing her weight and self breast awareness.  She is planning on starting weight watchers this week and I congratulated her and encouraged this.  I reviewed her case in its entirety including f/u plan with Dr. Jana Hakim.    The patient will return in one year for follow up.   She knows to call us in the interim for any  questions or concerns.  We can certainly see her sooner if needed.  I spent 25 minutes counseling the patient face to face.  The total time spent in the appointment was 30 minutes.  Minette Headland, [place] [phone] 02/21/2014 12:22 PM

## 2014-02-25 DIAGNOSIS — M899 Disorder of bone, unspecified: Secondary | ICD-10-CM | POA: Diagnosis not present

## 2014-02-25 DIAGNOSIS — E039 Hypothyroidism, unspecified: Secondary | ICD-10-CM | POA: Diagnosis not present

## 2014-02-25 DIAGNOSIS — E785 Hyperlipidemia, unspecified: Secondary | ICD-10-CM | POA: Diagnosis not present

## 2014-02-25 DIAGNOSIS — Z9641 Presence of insulin pump (external) (internal): Secondary | ICD-10-CM | POA: Diagnosis not present

## 2014-02-25 DIAGNOSIS — M949 Disorder of cartilage, unspecified: Secondary | ICD-10-CM | POA: Diagnosis not present

## 2014-02-25 DIAGNOSIS — E109 Type 1 diabetes mellitus without complications: Secondary | ICD-10-CM | POA: Diagnosis not present

## 2014-03-25 DIAGNOSIS — E11359 Type 2 diabetes mellitus with proliferative diabetic retinopathy without macular edema: Secondary | ICD-10-CM | POA: Diagnosis not present

## 2014-03-25 DIAGNOSIS — E1039 Type 1 diabetes mellitus with other diabetic ophthalmic complication: Secondary | ICD-10-CM | POA: Diagnosis not present

## 2014-04-13 DIAGNOSIS — F411 Generalized anxiety disorder: Secondary | ICD-10-CM | POA: Diagnosis not present

## 2014-04-13 DIAGNOSIS — F0630 Mood disorder due to known physiological condition, unspecified: Secondary | ICD-10-CM | POA: Diagnosis not present

## 2014-05-25 DIAGNOSIS — E785 Hyperlipidemia, unspecified: Secondary | ICD-10-CM | POA: Diagnosis not present

## 2014-05-25 DIAGNOSIS — E109 Type 1 diabetes mellitus without complications: Secondary | ICD-10-CM | POA: Diagnosis not present

## 2014-05-25 DIAGNOSIS — E039 Hypothyroidism, unspecified: Secondary | ICD-10-CM | POA: Diagnosis not present

## 2014-06-01 DIAGNOSIS — E109 Type 1 diabetes mellitus without complications: Secondary | ICD-10-CM | POA: Diagnosis not present

## 2014-06-01 DIAGNOSIS — E039 Hypothyroidism, unspecified: Secondary | ICD-10-CM | POA: Diagnosis not present

## 2014-06-01 DIAGNOSIS — Z9641 Presence of insulin pump (external) (internal): Secondary | ICD-10-CM | POA: Diagnosis not present

## 2014-06-01 DIAGNOSIS — M949 Disorder of cartilage, unspecified: Secondary | ICD-10-CM | POA: Diagnosis not present

## 2014-06-01 DIAGNOSIS — E785 Hyperlipidemia, unspecified: Secondary | ICD-10-CM | POA: Diagnosis not present

## 2014-06-01 DIAGNOSIS — M899 Disorder of bone, unspecified: Secondary | ICD-10-CM | POA: Diagnosis not present

## 2014-07-06 DIAGNOSIS — I872 Venous insufficiency (chronic) (peripheral): Secondary | ICD-10-CM | POA: Diagnosis not present

## 2014-07-06 DIAGNOSIS — L089 Local infection of the skin and subcutaneous tissue, unspecified: Secondary | ICD-10-CM | POA: Diagnosis not present

## 2014-07-14 ENCOUNTER — Telehealth: Payer: Self-pay | Admitting: Hematology and Oncology

## 2014-07-14 NOTE — Telephone Encounter (Signed)
received message from [place] re pt calling for annual f/u - former [facility] pt. pt last seen april 2015 - no pof sent. per office note pt to have annual f/u. s/w pt re appt for 02/17/2015 w/VG.

## 2014-07-27 DIAGNOSIS — Z23 Encounter for immunization: Secondary | ICD-10-CM | POA: Diagnosis not present

## 2014-08-19 DIAGNOSIS — I70203 Unspecified atherosclerosis of native arteries of extremities, bilateral legs: Secondary | ICD-10-CM | POA: Diagnosis not present

## 2014-08-19 DIAGNOSIS — I70213 Atherosclerosis of native arteries of extremities with intermittent claudication, bilateral legs: Secondary | ICD-10-CM | POA: Diagnosis not present

## 2014-08-19 DIAGNOSIS — I8311 Varicose veins of right lower extremity with inflammation: Secondary | ICD-10-CM | POA: Diagnosis not present

## 2014-08-19 DIAGNOSIS — I8312 Varicose veins of left lower extremity with inflammation: Secondary | ICD-10-CM | POA: Diagnosis not present

## 2014-09-08 DIAGNOSIS — E109 Type 1 diabetes mellitus without complications: Secondary | ICD-10-CM | POA: Diagnosis not present

## 2014-09-08 DIAGNOSIS — M899 Disorder of bone, unspecified: Secondary | ICD-10-CM | POA: Diagnosis not present

## 2014-09-08 DIAGNOSIS — E785 Hyperlipidemia, unspecified: Secondary | ICD-10-CM | POA: Diagnosis not present

## 2014-09-08 DIAGNOSIS — E039 Hypothyroidism, unspecified: Secondary | ICD-10-CM | POA: Diagnosis not present

## 2014-09-14 DIAGNOSIS — E780 Pure hypercholesterolemia: Secondary | ICD-10-CM | POA: Diagnosis not present

## 2014-09-14 DIAGNOSIS — M8589 Other specified disorders of bone density and structure, multiple sites: Secondary | ICD-10-CM | POA: Diagnosis not present

## 2014-09-14 DIAGNOSIS — Z9641 Presence of insulin pump (external) (internal): Secondary | ICD-10-CM | POA: Diagnosis not present

## 2014-09-14 DIAGNOSIS — E109 Type 1 diabetes mellitus without complications: Secondary | ICD-10-CM | POA: Diagnosis not present

## 2014-09-14 DIAGNOSIS — E038 Other specified hypothyroidism: Secondary | ICD-10-CM | POA: Diagnosis not present

## 2014-10-07 DIAGNOSIS — F411 Generalized anxiety disorder: Secondary | ICD-10-CM | POA: Diagnosis not present

## 2014-10-22 DIAGNOSIS — I10 Essential (primary) hypertension: Secondary | ICD-10-CM | POA: Diagnosis not present

## 2014-10-22 DIAGNOSIS — R200 Anesthesia of skin: Secondary | ICD-10-CM | POA: Diagnosis not present

## 2014-10-22 DIAGNOSIS — E039 Hypothyroidism, unspecified: Secondary | ICD-10-CM | POA: Diagnosis not present

## 2014-10-22 DIAGNOSIS — E780 Pure hypercholesterolemia: Secondary | ICD-10-CM | POA: Diagnosis not present

## 2014-10-22 DIAGNOSIS — G459 Transient cerebral ischemic attack, unspecified: Secondary | ICD-10-CM | POA: Diagnosis not present

## 2014-10-22 DIAGNOSIS — Z794 Long term (current) use of insulin: Secondary | ICD-10-CM | POA: Diagnosis not present

## 2014-10-22 DIAGNOSIS — R4781 Slurred speech: Secondary | ICD-10-CM | POA: Diagnosis not present

## 2014-10-22 DIAGNOSIS — E119 Type 2 diabetes mellitus without complications: Secondary | ICD-10-CM | POA: Diagnosis not present

## 2014-10-22 DIAGNOSIS — Z8673 Personal history of transient ischemic attack (TIA), and cerebral infarction without residual deficits: Secondary | ICD-10-CM | POA: Diagnosis not present

## 2014-10-26 DIAGNOSIS — F411 Generalized anxiety disorder: Secondary | ICD-10-CM | POA: Diagnosis not present

## 2014-11-10 DIAGNOSIS — G459 Transient cerebral ischemic attack, unspecified: Secondary | ICD-10-CM | POA: Diagnosis not present

## 2014-11-10 DIAGNOSIS — E039 Hypothyroidism, unspecified: Secondary | ICD-10-CM | POA: Diagnosis not present

## 2014-11-10 DIAGNOSIS — E119 Type 2 diabetes mellitus without complications: Secondary | ICD-10-CM | POA: Diagnosis not present

## 2014-11-15 DIAGNOSIS — R4781 Slurred speech: Secondary | ICD-10-CM | POA: Diagnosis not present

## 2014-11-15 DIAGNOSIS — G459 Transient cerebral ischemic attack, unspecified: Secondary | ICD-10-CM | POA: Diagnosis not present

## 2014-11-23 DIAGNOSIS — F411 Generalized anxiety disorder: Secondary | ICD-10-CM | POA: Diagnosis not present

## 2014-12-13 DIAGNOSIS — E780 Pure hypercholesterolemia: Secondary | ICD-10-CM | POA: Diagnosis not present

## 2014-12-13 DIAGNOSIS — E038 Other specified hypothyroidism: Secondary | ICD-10-CM | POA: Diagnosis not present

## 2014-12-13 DIAGNOSIS — E109 Type 1 diabetes mellitus without complications: Secondary | ICD-10-CM | POA: Diagnosis not present

## 2014-12-16 DIAGNOSIS — M8589 Other specified disorders of bone density and structure, multiple sites: Secondary | ICD-10-CM | POA: Diagnosis not present

## 2014-12-16 DIAGNOSIS — E109 Type 1 diabetes mellitus without complications: Secondary | ICD-10-CM | POA: Diagnosis not present

## 2014-12-16 DIAGNOSIS — Z9641 Presence of insulin pump (external) (internal): Secondary | ICD-10-CM | POA: Diagnosis not present

## 2014-12-16 DIAGNOSIS — E038 Other specified hypothyroidism: Secondary | ICD-10-CM | POA: Diagnosis not present

## 2014-12-16 DIAGNOSIS — E780 Pure hypercholesterolemia: Secondary | ICD-10-CM | POA: Diagnosis not present

## 2015-01-03 ENCOUNTER — Other Ambulatory Visit: Payer: Self-pay

## 2015-01-03 DIAGNOSIS — Z1231 Encounter for screening mammogram for malignant neoplasm of breast: Secondary | ICD-10-CM

## 2015-01-24 DIAGNOSIS — F411 Generalized anxiety disorder: Secondary | ICD-10-CM | POA: Diagnosis not present

## 2015-01-27 DIAGNOSIS — Z6826 Body mass index (BMI) 26.0-26.9, adult: Secondary | ICD-10-CM | POA: Diagnosis not present

## 2015-01-27 DIAGNOSIS — Z01419 Encounter for gynecological examination (general) (routine) without abnormal findings: Secondary | ICD-10-CM | POA: Diagnosis not present

## 2015-02-09 DIAGNOSIS — I739 Peripheral vascular disease, unspecified: Secondary | ICD-10-CM | POA: Diagnosis not present

## 2015-02-09 DIAGNOSIS — E785 Hyperlipidemia, unspecified: Secondary | ICD-10-CM | POA: Diagnosis not present

## 2015-02-09 DIAGNOSIS — I10 Essential (primary) hypertension: Secondary | ICD-10-CM | POA: Diagnosis not present

## 2015-02-09 DIAGNOSIS — E039 Hypothyroidism, unspecified: Secondary | ICD-10-CM | POA: Diagnosis not present

## 2015-02-11 ENCOUNTER — Ambulatory Visit
Admission: RE | Admit: 2015-02-11 | Discharge: 2015-02-11 | Disposition: A | Discharge status: Home / Self Care | Payer: Medicare Other | Source: Ambulatory Visit

## 2015-02-11 DIAGNOSIS — Z1231 Encounter for screening mammogram for malignant neoplasm of breast: Secondary | ICD-10-CM | POA: Diagnosis not present

## 2015-02-14 DIAGNOSIS — E119 Type 2 diabetes mellitus without complications: Secondary | ICD-10-CM | POA: Diagnosis not present

## 2015-02-17 ENCOUNTER — Ambulatory Visit (HOSPITAL_BASED_OUTPATIENT_CLINIC_OR_DEPARTMENT_OTHER): Payer: Medicare Other | Admitting: Hematology and Oncology

## 2015-02-17 ENCOUNTER — Telehealth: Payer: Self-pay | Admitting: Hematology and Oncology

## 2015-02-17 VITALS — BP 119/50 | HR 81 | Temp 98.3°F | Resp 18 | Ht 67.0 in | Wt 164.3 lb

## 2015-02-17 DIAGNOSIS — Z853 Personal history of malignant neoplasm of breast: Secondary | ICD-10-CM

## 2015-02-17 DIAGNOSIS — Z79810 Long term (current) use of selective estrogen receptor modulators (SERMs): Secondary | ICD-10-CM

## 2015-02-17 DIAGNOSIS — D0502 Lobular carcinoma in situ of left breast: Secondary | ICD-10-CM

## 2015-02-17 NOTE — Telephone Encounter (Signed)
gave and printed appt sched and avs fo rpt for April 2017 °

## 2015-02-17 NOTE — Assessment & Plan Note (Addendum)
Left breast LCIS status post lumpectomy 12/06/2010, 1.8 cm LCIS, ER 100%, PR 59%, started tamoxifen 20 mg daily March 2012  Tamoxifen toxicities: Tolerating it very well without any major problems. Patient had an episode of slurred speech in October 2014 felt to be a TIA  Breast Cancer Surveillance: 1. Breast exam 02/17/2015: Normal 2. Mammogram 02/11/2015 No abnormalities. Postsurgical changes. Breast Density Category C. I recommended that she get 3-D mammograms for surveillance. Discussed the differences between different breast density categories.  Return to clinic in 1 year for follow-up

## 2015-02-17 NOTE — Progress Notes (Signed)
Patient Care Team: Ernestene Kiel, MD as PCP - General (Internal Medicine)  DIAGNOSIS: lobular carcinoma in situ 2012. Current treatment: Tamoxifen started 2012  CHIEF COMPLIANT: follow-up of tamoxifen  INTERVAL HISTORY: April Long is a 69 year old lady with above-mentioned history of LCIS diagnosed in 2012 status post lumpectomy and is currently on tamoxifen for risk reduction. She is tolerating tamoxifen extremely well without any major problems or concerns. She had a mammogram recently which was normal. She denies any new lumps or nodules in the breasts.  REVIEW OF SYSTEMS:   Constitutional: Denies fevers, chills or abnormal weight loss Eyes: Denies blurriness of vision Ears, nose, mouth, throat, and face: Denies mucositis or sore throat Respiratory: Denies cough, dyspnea or wheezes Cardiovascular: Denies palpitation, chest discomfort or lower extremity swelling Gastrointestinal:  Denies nausea, heartburn or change in bowel habits Skin: Denies abnormal skin rashes Lymphatics: Denies new lymphadenopathy or easy bruising Neurological:Denies numbness, tingling or new weaknesses Behavioral/Psych: Mood is stable, no new changes  Breast:  denies any pain or lumps or nodules in either breasts All other systems were reviewed with the patient and are negative.  I have reviewed the past medical history, past surgical history, social history and family history with the patient and they are unchanged from previous note.  ALLERGIES:  is allergic to sulfa drugs cross reactors.  MEDICATIONS:  Current Outpatient Prescriptions  Medication Sig Dispense Refill  . atorvastatin (LIPITOR) 10 MG tablet Take 10 mg by mouth daily.    Marland Kitchen co-enzyme Q-10 30 MG capsule Take 100 mg by mouth daily.     Marland Kitchen escitalopram (LEXAPRO) 20 MG tablet   0  . Insulin Aspart (NOVOLOG [place]) Inject into the skin. Insulin pump     . Levothyroxine Sodium (SYNTHROID PO) Take by mouth.      Marland Kitchen lisinopril (PRINIVIL,ZESTRIL)  10 MG tablet Take 10 mg by mouth daily.    . Multiple Vitamin (MULTIVITAMIN) tablet Take 2 tablets by mouth daily.     . tamoxifen (NOLVADEX) 20 MG tablet Take 1 tablet (20 mg total) by mouth daily. 90 tablet 12  . VITAMIN D, CHOLECALCIFEROL, PO Take 1,000 Units by mouth.      No current facility-administered medications for this visit.    PHYSICAL EXAMINATION: ECOG PERFORMANCE STATUS: 0 - Asymptomatic  Filed Vitals:   02/17/15 1345  BP: 119/50  Pulse: 81  Temp: 98.3 F (36.8 C)  Resp: 18   Filed Weights   02/17/15 1345  Weight: 164 lb 4.8 oz (74.526 kg)    GENERAL:alert, no distress and comfortable SKIN: skin color, texture, turgor are normal, no rashes or significant lesions EYES: normal, Conjunctiva are pink and non-injected, sclera clear OROPHARYNX:no exudate, no erythema and lips, buccal mucosa, and tongue normal  NECK: supple, thyroid normal size, non-tender, without nodularity LYMPH:  no palpable lymphadenopathy in the cervical, axillary or inguinal LUNGS: clear to auscultation and percussion with normal breathing effort HEART: regular rate & rhythm and no murmurs and no lower extremity edema ABDOMEN:abdomen soft, non-tender and normal bowel sounds Musculoskeletal:no cyanosis of digits and no clubbing  NEURO: alert & oriented x 3 with fluent speech, no focal motor/sensory deficits BREAST: No palpable masses or nodules in either right or left breasts. No palpable axillary supraclavicular or infraclavicular adenopathy no breast tenderness or nipple discharge. (exam performed in the presence of a chaperone)  LABORATORY DATA:  I have reviewed the data as listed   Chemistry      Component Value Date/Time   NA  142 02/18/2013 1207   NA 140 11/29/2011 0907   K 4.1 02/18/2013 1207   K 5.3 11/29/2011 0907   CL 105 02/18/2013 1207   CL 102 11/29/2011 0907   CO2 29 02/18/2013 1207   CO2 27 11/29/2011 0907   BUN 20.1 02/18/2013 1207   BUN 28* 11/29/2011 0907   CREATININE  1.0 02/18/2013 1207   CREATININE 1.22* 11/29/2011 0907      Component Value Date/Time   CALCIUM 9.2 02/18/2013 1207   CALCIUM 9.4 11/29/2011 0907   ALKPHOS 42 02/18/2013 1207   ALKPHOS 36* 11/29/2011 0907   AST 22 02/18/2013 1207   AST 27 11/29/2011 0907   ALT 23 02/18/2013 1207   ALT 19 11/29/2011 0907   BILITOT 0.49 02/18/2013 1207   BILITOT 0.4 11/29/2011 0907       Lab Results  Component Value Date   WBC 8.7 02/18/2013   HGB 13.2 02/18/2013   HCT 39.6 02/18/2013   MCV 91.5 02/18/2013   PLT 252 02/18/2013   NEUTROABS 6.3 02/18/2013     RADIOGRAPHIC STUDIES: I have personally reviewed the radiology reports and agreed with their findings. Mammogram 02/11/2015 normal  ASSESSMENT & PLAN:  Lobular carcinoma in situ of left breast Left breast LCIS status post lumpectomy 12/06/2010, 1.8 cm LCIS, ER 100%, PR 59%, started tamoxifen 20 mg daily March 2012  Tamoxifen toxicities: Tolerating it very well without any major problems. Patient had an episode of slurred speech in October 2014 felt to be a TIA  Breast Cancer Surveillance: 1. Breast exam 02/17/2015: Normal 2. Mammogram 02/11/2015 No abnormalities. Postsurgical changes. Breast Density Category C. I recommended that she get 3-D mammograms for surveillance. Discussed the differences between different breast density categories.  Return to clinic in 1 year for follow-up     No orders of the defined types were placed in this encounter.   The patient has a good understanding of the overall plan. she agrees with it. She will call with any problems that may develop before her next visit here.   Rulon Eisenmenger, MD

## 2015-03-03 DIAGNOSIS — H25811 Combined forms of age-related cataract, right eye: Secondary | ICD-10-CM | POA: Diagnosis not present

## 2015-03-03 DIAGNOSIS — H2511 Age-related nuclear cataract, right eye: Secondary | ICD-10-CM | POA: Diagnosis not present

## 2015-03-03 DIAGNOSIS — E119 Type 2 diabetes mellitus without complications: Secondary | ICD-10-CM | POA: Diagnosis not present

## 2015-03-15 DIAGNOSIS — E780 Pure hypercholesterolemia: Secondary | ICD-10-CM | POA: Diagnosis not present

## 2015-03-15 DIAGNOSIS — E109 Type 1 diabetes mellitus without complications: Secondary | ICD-10-CM | POA: Diagnosis not present

## 2015-03-15 DIAGNOSIS — E038 Other specified hypothyroidism: Secondary | ICD-10-CM | POA: Diagnosis not present

## 2015-03-17 DIAGNOSIS — M8589 Other specified disorders of bone density and structure, multiple sites: Secondary | ICD-10-CM | POA: Diagnosis not present

## 2015-03-17 DIAGNOSIS — E109 Type 1 diabetes mellitus without complications: Secondary | ICD-10-CM | POA: Diagnosis not present

## 2015-03-17 DIAGNOSIS — E038 Other specified hypothyroidism: Secondary | ICD-10-CM | POA: Diagnosis not present

## 2015-03-17 DIAGNOSIS — Z9641 Presence of insulin pump (external) (internal): Secondary | ICD-10-CM | POA: Diagnosis not present

## 2015-03-17 DIAGNOSIS — E780 Pure hypercholesterolemia: Secondary | ICD-10-CM | POA: Diagnosis not present

## 2015-04-16 DIAGNOSIS — Z87891 Personal history of nicotine dependence: Secondary | ICD-10-CM | POA: Diagnosis not present

## 2015-04-16 DIAGNOSIS — I10 Essential (primary) hypertension: Secondary | ICD-10-CM | POA: Diagnosis not present

## 2015-04-16 DIAGNOSIS — E780 Pure hypercholesterolemia: Secondary | ICD-10-CM | POA: Diagnosis not present

## 2015-04-16 DIAGNOSIS — Z79899 Other long term (current) drug therapy: Secondary | ICD-10-CM | POA: Diagnosis not present

## 2015-04-16 DIAGNOSIS — S72492A Other fracture of lower end of left femur, initial encounter for closed fracture: Secondary | ICD-10-CM | POA: Diagnosis not present

## 2015-04-16 DIAGNOSIS — E039 Hypothyroidism, unspecified: Secondary | ICD-10-CM | POA: Diagnosis not present

## 2015-04-16 DIAGNOSIS — S72302A Unspecified fracture of shaft of left femur, initial encounter for closed fracture: Secondary | ICD-10-CM | POA: Diagnosis not present

## 2015-04-16 DIAGNOSIS — S79192A Other physeal fracture of lower end of left femur, initial encounter for closed fracture: Secondary | ICD-10-CM | POA: Diagnosis not present

## 2015-04-16 DIAGNOSIS — S72402B Unspecified fracture of lower end of left femur, initial encounter for open fracture type I or II: Secondary | ICD-10-CM | POA: Diagnosis not present

## 2015-04-16 DIAGNOSIS — E119 Type 2 diabetes mellitus without complications: Secondary | ICD-10-CM | POA: Diagnosis not present

## 2015-04-17 DIAGNOSIS — S79192A Other physeal fracture of lower end of left femur, initial encounter for closed fracture: Secondary | ICD-10-CM | POA: Diagnosis not present

## 2015-04-18 DIAGNOSIS — S7292XA Unspecified fracture of left femur, initial encounter for closed fracture: Secondary | ICD-10-CM | POA: Diagnosis not present

## 2015-05-03 DIAGNOSIS — S72345D Nondisplaced spiral fracture of shaft of left femur, subsequent encounter for closed fracture with routine healing: Secondary | ICD-10-CM | POA: Diagnosis not present

## 2015-05-05 DIAGNOSIS — G4730 Sleep apnea, unspecified: Secondary | ICD-10-CM | POA: Diagnosis not present

## 2015-05-05 DIAGNOSIS — M1990 Unspecified osteoarthritis, unspecified site: Secondary | ICD-10-CM | POA: Diagnosis not present

## 2015-05-05 DIAGNOSIS — Z9181 History of falling: Secondary | ICD-10-CM | POA: Diagnosis not present

## 2015-05-05 DIAGNOSIS — Z87891 Personal history of nicotine dependence: Secondary | ICD-10-CM | POA: Diagnosis not present

## 2015-05-05 DIAGNOSIS — I739 Peripheral vascular disease, unspecified: Secondary | ICD-10-CM | POA: Diagnosis not present

## 2015-05-05 DIAGNOSIS — S7292XD Unspecified fracture of left femur, subsequent encounter for closed fracture with routine healing: Secondary | ICD-10-CM | POA: Diagnosis not present

## 2015-05-05 DIAGNOSIS — E1049 Type 1 diabetes mellitus with other diabetic neurological complication: Secondary | ICD-10-CM | POA: Diagnosis not present

## 2015-05-06 DIAGNOSIS — G4730 Sleep apnea, unspecified: Secondary | ICD-10-CM | POA: Diagnosis not present

## 2015-05-06 DIAGNOSIS — E1049 Type 1 diabetes mellitus with other diabetic neurological complication: Secondary | ICD-10-CM | POA: Diagnosis not present

## 2015-05-06 DIAGNOSIS — Z87891 Personal history of nicotine dependence: Secondary | ICD-10-CM | POA: Diagnosis not present

## 2015-05-06 DIAGNOSIS — S7292XD Unspecified fracture of left femur, subsequent encounter for closed fracture with routine healing: Secondary | ICD-10-CM | POA: Diagnosis not present

## 2015-05-06 DIAGNOSIS — I739 Peripheral vascular disease, unspecified: Secondary | ICD-10-CM | POA: Diagnosis not present

## 2015-05-06 DIAGNOSIS — M1990 Unspecified osteoarthritis, unspecified site: Secondary | ICD-10-CM | POA: Diagnosis not present

## 2015-05-09 DIAGNOSIS — Z87891 Personal history of nicotine dependence: Secondary | ICD-10-CM | POA: Diagnosis not present

## 2015-05-09 DIAGNOSIS — M1990 Unspecified osteoarthritis, unspecified site: Secondary | ICD-10-CM | POA: Diagnosis not present

## 2015-05-09 DIAGNOSIS — G4730 Sleep apnea, unspecified: Secondary | ICD-10-CM | POA: Diagnosis not present

## 2015-05-09 DIAGNOSIS — I739 Peripheral vascular disease, unspecified: Secondary | ICD-10-CM | POA: Diagnosis not present

## 2015-05-09 DIAGNOSIS — S7292XD Unspecified fracture of left femur, subsequent encounter for closed fracture with routine healing: Secondary | ICD-10-CM | POA: Diagnosis not present

## 2015-05-09 DIAGNOSIS — E1049 Type 1 diabetes mellitus with other diabetic neurological complication: Secondary | ICD-10-CM | POA: Diagnosis not present

## 2015-05-10 DIAGNOSIS — Z87891 Personal history of nicotine dependence: Secondary | ICD-10-CM | POA: Diagnosis not present

## 2015-05-10 DIAGNOSIS — S7292XD Unspecified fracture of left femur, subsequent encounter for closed fracture with routine healing: Secondary | ICD-10-CM | POA: Diagnosis not present

## 2015-05-10 DIAGNOSIS — E1049 Type 1 diabetes mellitus with other diabetic neurological complication: Secondary | ICD-10-CM | POA: Diagnosis not present

## 2015-05-10 DIAGNOSIS — G4730 Sleep apnea, unspecified: Secondary | ICD-10-CM | POA: Diagnosis not present

## 2015-05-10 DIAGNOSIS — M1990 Unspecified osteoarthritis, unspecified site: Secondary | ICD-10-CM | POA: Diagnosis not present

## 2015-05-10 DIAGNOSIS — I739 Peripheral vascular disease, unspecified: Secondary | ICD-10-CM | POA: Diagnosis not present

## 2015-05-11 DIAGNOSIS — E1049 Type 1 diabetes mellitus with other diabetic neurological complication: Secondary | ICD-10-CM | POA: Diagnosis not present

## 2015-05-11 DIAGNOSIS — S7292XD Unspecified fracture of left femur, subsequent encounter for closed fracture with routine healing: Secondary | ICD-10-CM | POA: Diagnosis not present

## 2015-05-11 DIAGNOSIS — G4730 Sleep apnea, unspecified: Secondary | ICD-10-CM | POA: Diagnosis not present

## 2015-05-11 DIAGNOSIS — M1990 Unspecified osteoarthritis, unspecified site: Secondary | ICD-10-CM | POA: Diagnosis not present

## 2015-05-11 DIAGNOSIS — Z87891 Personal history of nicotine dependence: Secondary | ICD-10-CM | POA: Diagnosis not present

## 2015-05-11 DIAGNOSIS — I739 Peripheral vascular disease, unspecified: Secondary | ICD-10-CM | POA: Diagnosis not present

## 2015-05-13 DIAGNOSIS — S7292XD Unspecified fracture of left femur, subsequent encounter for closed fracture with routine healing: Secondary | ICD-10-CM | POA: Diagnosis not present

## 2015-05-13 DIAGNOSIS — Z87891 Personal history of nicotine dependence: Secondary | ICD-10-CM | POA: Diagnosis not present

## 2015-05-13 DIAGNOSIS — M1990 Unspecified osteoarthritis, unspecified site: Secondary | ICD-10-CM | POA: Diagnosis not present

## 2015-05-13 DIAGNOSIS — I739 Peripheral vascular disease, unspecified: Secondary | ICD-10-CM | POA: Diagnosis not present

## 2015-05-13 DIAGNOSIS — E1049 Type 1 diabetes mellitus with other diabetic neurological complication: Secondary | ICD-10-CM | POA: Diagnosis not present

## 2015-05-13 DIAGNOSIS — G4730 Sleep apnea, unspecified: Secondary | ICD-10-CM | POA: Diagnosis not present

## 2015-05-16 DIAGNOSIS — G4730 Sleep apnea, unspecified: Secondary | ICD-10-CM | POA: Diagnosis not present

## 2015-05-16 DIAGNOSIS — Z87891 Personal history of nicotine dependence: Secondary | ICD-10-CM | POA: Diagnosis not present

## 2015-05-16 DIAGNOSIS — M1990 Unspecified osteoarthritis, unspecified site: Secondary | ICD-10-CM | POA: Diagnosis not present

## 2015-05-16 DIAGNOSIS — S7292XD Unspecified fracture of left femur, subsequent encounter for closed fracture with routine healing: Secondary | ICD-10-CM | POA: Diagnosis not present

## 2015-05-16 DIAGNOSIS — I739 Peripheral vascular disease, unspecified: Secondary | ICD-10-CM | POA: Diagnosis not present

## 2015-05-16 DIAGNOSIS — E1049 Type 1 diabetes mellitus with other diabetic neurological complication: Secondary | ICD-10-CM | POA: Diagnosis not present

## 2015-05-17 DIAGNOSIS — S7292XD Unspecified fracture of left femur, subsequent encounter for closed fracture with routine healing: Secondary | ICD-10-CM | POA: Diagnosis not present

## 2015-05-17 DIAGNOSIS — G4730 Sleep apnea, unspecified: Secondary | ICD-10-CM | POA: Diagnosis not present

## 2015-05-17 DIAGNOSIS — I739 Peripheral vascular disease, unspecified: Secondary | ICD-10-CM | POA: Diagnosis not present

## 2015-05-17 DIAGNOSIS — E1049 Type 1 diabetes mellitus with other diabetic neurological complication: Secondary | ICD-10-CM | POA: Diagnosis not present

## 2015-05-17 DIAGNOSIS — Z87891 Personal history of nicotine dependence: Secondary | ICD-10-CM | POA: Diagnosis not present

## 2015-05-17 DIAGNOSIS — M1990 Unspecified osteoarthritis, unspecified site: Secondary | ICD-10-CM | POA: Diagnosis not present

## 2015-05-18 DIAGNOSIS — S7292XD Unspecified fracture of left femur, subsequent encounter for closed fracture with routine healing: Secondary | ICD-10-CM | POA: Diagnosis not present

## 2015-05-18 DIAGNOSIS — E1049 Type 1 diabetes mellitus with other diabetic neurological complication: Secondary | ICD-10-CM | POA: Diagnosis not present

## 2015-05-18 DIAGNOSIS — I739 Peripheral vascular disease, unspecified: Secondary | ICD-10-CM | POA: Diagnosis not present

## 2015-05-18 DIAGNOSIS — M1990 Unspecified osteoarthritis, unspecified site: Secondary | ICD-10-CM | POA: Diagnosis not present

## 2015-05-18 DIAGNOSIS — Z87891 Personal history of nicotine dependence: Secondary | ICD-10-CM | POA: Diagnosis not present

## 2015-05-18 DIAGNOSIS — G4730 Sleep apnea, unspecified: Secondary | ICD-10-CM | POA: Diagnosis not present

## 2015-05-19 DIAGNOSIS — S72345D Nondisplaced spiral fracture of shaft of left femur, subsequent encounter for closed fracture with routine healing: Secondary | ICD-10-CM | POA: Diagnosis not present

## 2015-05-20 DIAGNOSIS — M1990 Unspecified osteoarthritis, unspecified site: Secondary | ICD-10-CM | POA: Diagnosis not present

## 2015-05-20 DIAGNOSIS — G4730 Sleep apnea, unspecified: Secondary | ICD-10-CM | POA: Diagnosis not present

## 2015-05-20 DIAGNOSIS — Z87891 Personal history of nicotine dependence: Secondary | ICD-10-CM | POA: Diagnosis not present

## 2015-05-20 DIAGNOSIS — S7292XD Unspecified fracture of left femur, subsequent encounter for closed fracture with routine healing: Secondary | ICD-10-CM | POA: Diagnosis not present

## 2015-05-20 DIAGNOSIS — E1049 Type 1 diabetes mellitus with other diabetic neurological complication: Secondary | ICD-10-CM | POA: Diagnosis not present

## 2015-05-20 DIAGNOSIS — I739 Peripheral vascular disease, unspecified: Secondary | ICD-10-CM | POA: Diagnosis not present

## 2015-05-23 DIAGNOSIS — M1990 Unspecified osteoarthritis, unspecified site: Secondary | ICD-10-CM | POA: Diagnosis not present

## 2015-05-23 DIAGNOSIS — E1049 Type 1 diabetes mellitus with other diabetic neurological complication: Secondary | ICD-10-CM | POA: Diagnosis not present

## 2015-05-23 DIAGNOSIS — G4730 Sleep apnea, unspecified: Secondary | ICD-10-CM | POA: Diagnosis not present

## 2015-05-23 DIAGNOSIS — S7292XD Unspecified fracture of left femur, subsequent encounter for closed fracture with routine healing: Secondary | ICD-10-CM | POA: Diagnosis not present

## 2015-05-23 DIAGNOSIS — Z87891 Personal history of nicotine dependence: Secondary | ICD-10-CM | POA: Diagnosis not present

## 2015-05-23 DIAGNOSIS — I739 Peripheral vascular disease, unspecified: Secondary | ICD-10-CM | POA: Diagnosis not present

## 2015-05-24 DIAGNOSIS — I739 Peripheral vascular disease, unspecified: Secondary | ICD-10-CM | POA: Diagnosis not present

## 2015-05-24 DIAGNOSIS — Z87891 Personal history of nicotine dependence: Secondary | ICD-10-CM | POA: Diagnosis not present

## 2015-05-24 DIAGNOSIS — S7292XD Unspecified fracture of left femur, subsequent encounter for closed fracture with routine healing: Secondary | ICD-10-CM | POA: Diagnosis not present

## 2015-05-24 DIAGNOSIS — G4730 Sleep apnea, unspecified: Secondary | ICD-10-CM | POA: Diagnosis not present

## 2015-05-24 DIAGNOSIS — E1049 Type 1 diabetes mellitus with other diabetic neurological complication: Secondary | ICD-10-CM | POA: Diagnosis not present

## 2015-05-24 DIAGNOSIS — M1990 Unspecified osteoarthritis, unspecified site: Secondary | ICD-10-CM | POA: Diagnosis not present

## 2015-05-26 DIAGNOSIS — I739 Peripheral vascular disease, unspecified: Secondary | ICD-10-CM | POA: Diagnosis not present

## 2015-05-26 DIAGNOSIS — G4730 Sleep apnea, unspecified: Secondary | ICD-10-CM | POA: Diagnosis not present

## 2015-05-26 DIAGNOSIS — Z87891 Personal history of nicotine dependence: Secondary | ICD-10-CM | POA: Diagnosis not present

## 2015-05-26 DIAGNOSIS — S7292XD Unspecified fracture of left femur, subsequent encounter for closed fracture with routine healing: Secondary | ICD-10-CM | POA: Diagnosis not present

## 2015-05-26 DIAGNOSIS — E1049 Type 1 diabetes mellitus with other diabetic neurological complication: Secondary | ICD-10-CM | POA: Diagnosis not present

## 2015-05-26 DIAGNOSIS — M1990 Unspecified osteoarthritis, unspecified site: Secondary | ICD-10-CM | POA: Diagnosis not present

## 2015-05-30 DIAGNOSIS — E1049 Type 1 diabetes mellitus with other diabetic neurological complication: Secondary | ICD-10-CM | POA: Diagnosis not present

## 2015-05-30 DIAGNOSIS — G4730 Sleep apnea, unspecified: Secondary | ICD-10-CM | POA: Diagnosis not present

## 2015-05-30 DIAGNOSIS — M1990 Unspecified osteoarthritis, unspecified site: Secondary | ICD-10-CM | POA: Diagnosis not present

## 2015-05-30 DIAGNOSIS — S7292XD Unspecified fracture of left femur, subsequent encounter for closed fracture with routine healing: Secondary | ICD-10-CM | POA: Diagnosis not present

## 2015-05-30 DIAGNOSIS — Z87891 Personal history of nicotine dependence: Secondary | ICD-10-CM | POA: Diagnosis not present

## 2015-05-30 DIAGNOSIS — I739 Peripheral vascular disease, unspecified: Secondary | ICD-10-CM | POA: Diagnosis not present

## 2015-06-01 DIAGNOSIS — G4730 Sleep apnea, unspecified: Secondary | ICD-10-CM | POA: Diagnosis not present

## 2015-06-01 DIAGNOSIS — Z87891 Personal history of nicotine dependence: Secondary | ICD-10-CM | POA: Diagnosis not present

## 2015-06-01 DIAGNOSIS — M1990 Unspecified osteoarthritis, unspecified site: Secondary | ICD-10-CM | POA: Diagnosis not present

## 2015-06-01 DIAGNOSIS — I739 Peripheral vascular disease, unspecified: Secondary | ICD-10-CM | POA: Diagnosis not present

## 2015-06-01 DIAGNOSIS — S7292XD Unspecified fracture of left femur, subsequent encounter for closed fracture with routine healing: Secondary | ICD-10-CM | POA: Diagnosis not present

## 2015-06-01 DIAGNOSIS — E1049 Type 1 diabetes mellitus with other diabetic neurological complication: Secondary | ICD-10-CM | POA: Diagnosis not present

## 2015-06-04 DIAGNOSIS — F411 Generalized anxiety disorder: Secondary | ICD-10-CM | POA: Diagnosis not present

## 2015-06-06 DIAGNOSIS — E1049 Type 1 diabetes mellitus with other diabetic neurological complication: Secondary | ICD-10-CM | POA: Diagnosis not present

## 2015-06-06 DIAGNOSIS — I739 Peripheral vascular disease, unspecified: Secondary | ICD-10-CM | POA: Diagnosis not present

## 2015-06-06 DIAGNOSIS — Z87891 Personal history of nicotine dependence: Secondary | ICD-10-CM | POA: Diagnosis not present

## 2015-06-06 DIAGNOSIS — M1990 Unspecified osteoarthritis, unspecified site: Secondary | ICD-10-CM | POA: Diagnosis not present

## 2015-06-06 DIAGNOSIS — S7292XD Unspecified fracture of left femur, subsequent encounter for closed fracture with routine healing: Secondary | ICD-10-CM | POA: Diagnosis not present

## 2015-06-06 DIAGNOSIS — G4730 Sleep apnea, unspecified: Secondary | ICD-10-CM | POA: Diagnosis not present

## 2015-06-09 DIAGNOSIS — Z87891 Personal history of nicotine dependence: Secondary | ICD-10-CM | POA: Diagnosis not present

## 2015-06-09 DIAGNOSIS — S7292XD Unspecified fracture of left femur, subsequent encounter for closed fracture with routine healing: Secondary | ICD-10-CM | POA: Diagnosis not present

## 2015-06-09 DIAGNOSIS — I739 Peripheral vascular disease, unspecified: Secondary | ICD-10-CM | POA: Diagnosis not present

## 2015-06-09 DIAGNOSIS — G4730 Sleep apnea, unspecified: Secondary | ICD-10-CM | POA: Diagnosis not present

## 2015-06-09 DIAGNOSIS — M1990 Unspecified osteoarthritis, unspecified site: Secondary | ICD-10-CM | POA: Diagnosis not present

## 2015-06-09 DIAGNOSIS — E1049 Type 1 diabetes mellitus with other diabetic neurological complication: Secondary | ICD-10-CM | POA: Diagnosis not present

## 2015-06-14 DIAGNOSIS — E038 Other specified hypothyroidism: Secondary | ICD-10-CM | POA: Diagnosis not present

## 2015-06-14 DIAGNOSIS — E780 Pure hypercholesterolemia: Secondary | ICD-10-CM | POA: Diagnosis not present

## 2015-06-14 DIAGNOSIS — E109 Type 1 diabetes mellitus without complications: Secondary | ICD-10-CM | POA: Diagnosis not present

## 2015-06-15 DIAGNOSIS — S8992XD Unspecified injury of left lower leg, subsequent encounter: Secondary | ICD-10-CM | POA: Diagnosis not present

## 2015-06-15 DIAGNOSIS — S72345D Nondisplaced spiral fracture of shaft of left femur, subsequent encounter for closed fracture with routine healing: Secondary | ICD-10-CM | POA: Diagnosis not present

## 2015-06-15 DIAGNOSIS — E038 Other specified hypothyroidism: Secondary | ICD-10-CM | POA: Diagnosis not present

## 2015-06-15 DIAGNOSIS — Z9641 Presence of insulin pump (external) (internal): Secondary | ICD-10-CM | POA: Diagnosis not present

## 2015-06-15 DIAGNOSIS — E780 Pure hypercholesterolemia: Secondary | ICD-10-CM | POA: Diagnosis not present

## 2015-06-15 DIAGNOSIS — E109 Type 1 diabetes mellitus without complications: Secondary | ICD-10-CM | POA: Diagnosis not present

## 2015-06-15 DIAGNOSIS — M8589 Other specified disorders of bone density and structure, multiple sites: Secondary | ICD-10-CM | POA: Diagnosis not present

## 2015-06-29 DIAGNOSIS — R262 Difficulty in walking, not elsewhere classified: Secondary | ICD-10-CM | POA: Diagnosis not present

## 2015-06-29 DIAGNOSIS — M6281 Muscle weakness (generalized): Secondary | ICD-10-CM | POA: Diagnosis not present

## 2015-07-04 DIAGNOSIS — R262 Difficulty in walking, not elsewhere classified: Secondary | ICD-10-CM | POA: Diagnosis not present

## 2015-07-04 DIAGNOSIS — M6281 Muscle weakness (generalized): Secondary | ICD-10-CM | POA: Diagnosis not present

## 2015-07-06 DIAGNOSIS — M6281 Muscle weakness (generalized): Secondary | ICD-10-CM | POA: Diagnosis not present

## 2015-07-06 DIAGNOSIS — R262 Difficulty in walking, not elsewhere classified: Secondary | ICD-10-CM | POA: Diagnosis not present

## 2015-07-11 DIAGNOSIS — J0100 Acute maxillary sinusitis, unspecified: Secondary | ICD-10-CM | POA: Diagnosis not present

## 2015-07-14 DIAGNOSIS — R262 Difficulty in walking, not elsewhere classified: Secondary | ICD-10-CM | POA: Diagnosis not present

## 2015-07-14 DIAGNOSIS — M6281 Muscle weakness (generalized): Secondary | ICD-10-CM | POA: Diagnosis not present

## 2015-07-17 DIAGNOSIS — Z23 Encounter for immunization: Secondary | ICD-10-CM | POA: Diagnosis not present

## 2015-07-18 DIAGNOSIS — M6281 Muscle weakness (generalized): Secondary | ICD-10-CM | POA: Diagnosis not present

## 2015-07-18 DIAGNOSIS — R262 Difficulty in walking, not elsewhere classified: Secondary | ICD-10-CM | POA: Diagnosis not present

## 2015-07-19 DIAGNOSIS — H2702 Aphakia, left eye: Secondary | ICD-10-CM | POA: Insufficient documentation

## 2015-07-19 DIAGNOSIS — H3342 Traction detachment of retina, left eye: Secondary | ICD-10-CM | POA: Diagnosis not present

## 2015-07-19 DIAGNOSIS — H26491 Other secondary cataract, right eye: Secondary | ICD-10-CM

## 2015-07-19 DIAGNOSIS — E11359 Type 2 diabetes mellitus with proliferative diabetic retinopathy without macular edema: Secondary | ICD-10-CM | POA: Diagnosis not present

## 2015-07-19 DIAGNOSIS — H3521 Other non-diabetic proliferative retinopathy, right eye: Secondary | ICD-10-CM | POA: Diagnosis not present

## 2015-07-19 DIAGNOSIS — Z961 Presence of intraocular lens: Secondary | ICD-10-CM

## 2015-07-19 HISTORY — DX: Presence of intraocular lens: Z96.1

## 2015-07-19 HISTORY — DX: Aphakia, left eye: H27.02

## 2015-07-19 HISTORY — DX: Other secondary cataract, right eye: H26.491

## 2015-07-21 DIAGNOSIS — M6281 Muscle weakness (generalized): Secondary | ICD-10-CM | POA: Diagnosis not present

## 2015-07-21 DIAGNOSIS — R262 Difficulty in walking, not elsewhere classified: Secondary | ICD-10-CM | POA: Diagnosis not present

## 2015-07-25 DIAGNOSIS — R262 Difficulty in walking, not elsewhere classified: Secondary | ICD-10-CM | POA: Diagnosis not present

## 2015-07-25 DIAGNOSIS — M6281 Muscle weakness (generalized): Secondary | ICD-10-CM | POA: Diagnosis not present

## 2015-07-28 DIAGNOSIS — R262 Difficulty in walking, not elsewhere classified: Secondary | ICD-10-CM | POA: Diagnosis not present

## 2015-07-28 DIAGNOSIS — M6281 Muscle weakness (generalized): Secondary | ICD-10-CM | POA: Diagnosis not present

## 2015-08-01 ENCOUNTER — Telehealth: Payer: Self-pay | Admitting: *Deleted

## 2015-08-01 ENCOUNTER — Other Ambulatory Visit: Payer: Self-pay | Admitting: *Deleted

## 2015-08-01 DIAGNOSIS — N6099 Unspecified benign mammary dysplasia of unspecified breast: Secondary | ICD-10-CM

## 2015-08-01 MED ORDER — TAMOXIFEN CITRATE 20 MG PO TABS: 20.0000 mg | ORAL_TABLET | Freq: Every day | ORAL | Status: DC

## 2015-08-01 NOTE — Telephone Encounter (Signed)
Patient called wanting to know long she needed to be on Tamoxifen. Stated that her pharmacy told her the prescription was cancelled. Reordered tamoxifen under Dr. Lindi Adie as was previously under Dr. Humphrey Rolls. Left VMM letting patient know.

## 2015-08-08 DIAGNOSIS — H26491 Other secondary cataract, right eye: Secondary | ICD-10-CM | POA: Diagnosis not present

## 2015-08-11 DIAGNOSIS — R262 Difficulty in walking, not elsewhere classified: Secondary | ICD-10-CM | POA: Diagnosis not present

## 2015-08-11 DIAGNOSIS — M6281 Muscle weakness (generalized): Secondary | ICD-10-CM | POA: Diagnosis not present

## 2015-08-15 DIAGNOSIS — R262 Difficulty in walking, not elsewhere classified: Secondary | ICD-10-CM | POA: Diagnosis not present

## 2015-08-15 DIAGNOSIS — M6281 Muscle weakness (generalized): Secondary | ICD-10-CM | POA: Diagnosis not present

## 2015-08-17 DIAGNOSIS — R262 Difficulty in walking, not elsewhere classified: Secondary | ICD-10-CM | POA: Diagnosis not present

## 2015-08-17 DIAGNOSIS — M6281 Muscle weakness (generalized): Secondary | ICD-10-CM | POA: Diagnosis not present

## 2015-08-18 DIAGNOSIS — H524 Presbyopia: Secondary | ICD-10-CM | POA: Diagnosis not present

## 2015-08-23 DIAGNOSIS — M6281 Muscle weakness (generalized): Secondary | ICD-10-CM | POA: Diagnosis not present

## 2015-08-23 DIAGNOSIS — R262 Difficulty in walking, not elsewhere classified: Secondary | ICD-10-CM | POA: Diagnosis not present

## 2015-08-25 DIAGNOSIS — R262 Difficulty in walking, not elsewhere classified: Secondary | ICD-10-CM | POA: Diagnosis not present

## 2015-08-25 DIAGNOSIS — M6281 Muscle weakness (generalized): Secondary | ICD-10-CM | POA: Diagnosis not present

## 2015-08-29 DIAGNOSIS — M6281 Muscle weakness (generalized): Secondary | ICD-10-CM | POA: Diagnosis not present

## 2015-08-29 DIAGNOSIS — R262 Difficulty in walking, not elsewhere classified: Secondary | ICD-10-CM | POA: Diagnosis not present

## 2015-08-30 DIAGNOSIS — M6281 Muscle weakness (generalized): Secondary | ICD-10-CM | POA: Diagnosis not present

## 2015-08-30 DIAGNOSIS — R262 Difficulty in walking, not elsewhere classified: Secondary | ICD-10-CM | POA: Diagnosis not present

## 2015-09-08 DIAGNOSIS — M6281 Muscle weakness (generalized): Secondary | ICD-10-CM | POA: Diagnosis not present

## 2015-09-08 DIAGNOSIS — R262 Difficulty in walking, not elsewhere classified: Secondary | ICD-10-CM | POA: Diagnosis not present

## 2015-09-12 DIAGNOSIS — E038 Other specified hypothyroidism: Secondary | ICD-10-CM | POA: Diagnosis not present

## 2015-09-12 DIAGNOSIS — E109 Type 1 diabetes mellitus without complications: Secondary | ICD-10-CM | POA: Diagnosis not present

## 2015-09-12 DIAGNOSIS — E7800 Pure hypercholesterolemia, unspecified: Secondary | ICD-10-CM | POA: Diagnosis not present

## 2015-09-13 DIAGNOSIS — R262 Difficulty in walking, not elsewhere classified: Secondary | ICD-10-CM | POA: Diagnosis not present

## 2015-09-13 DIAGNOSIS — M6281 Muscle weakness (generalized): Secondary | ICD-10-CM | POA: Diagnosis not present

## 2015-09-14 DIAGNOSIS — E103412 Type 1 diabetes mellitus with severe nonproliferative diabetic retinopathy with macular edema, left eye: Secondary | ICD-10-CM | POA: Diagnosis not present

## 2015-09-14 DIAGNOSIS — H47212 Primary optic atrophy, left eye: Secondary | ICD-10-CM | POA: Diagnosis not present

## 2015-09-19 DIAGNOSIS — E7800 Pure hypercholesterolemia, unspecified: Secondary | ICD-10-CM | POA: Diagnosis not present

## 2015-09-19 DIAGNOSIS — E109 Type 1 diabetes mellitus without complications: Secondary | ICD-10-CM | POA: Diagnosis not present

## 2015-09-19 DIAGNOSIS — E038 Other specified hypothyroidism: Secondary | ICD-10-CM | POA: Diagnosis not present

## 2015-09-19 DIAGNOSIS — Z9641 Presence of insulin pump (external) (internal): Secondary | ICD-10-CM | POA: Diagnosis not present

## 2015-09-19 DIAGNOSIS — M8589 Other specified disorders of bone density and structure, multiple sites: Secondary | ICD-10-CM | POA: Diagnosis not present

## 2015-09-26 ENCOUNTER — Telehealth: Payer: Self-pay

## 2015-09-26 NOTE — Telephone Encounter (Signed)
Returned pt call - Let her know she should remain on the tamoxifen and confirmed that she has refills available at [facility].  Pt voiced understanding.

## 2015-11-08 ENCOUNTER — Other Ambulatory Visit: Payer: Self-pay | Admitting: *Deleted

## 2015-11-08 DIAGNOSIS — N6099 Unspecified benign mammary dysplasia of unspecified breast: Secondary | ICD-10-CM

## 2015-11-08 MED ORDER — TAMOXIFEN CITRATE 20 MG PO TABS: 20.0000 mg | ORAL_TABLET | Freq: Every day | ORAL | Status: DC

## 2015-11-14 DIAGNOSIS — N950 Postmenopausal bleeding: Secondary | ICD-10-CM | POA: Diagnosis not present

## 2015-11-15 DIAGNOSIS — Z1211 Encounter for screening for malignant neoplasm of colon: Secondary | ICD-10-CM | POA: Diagnosis not present

## 2015-11-15 DIAGNOSIS — K591 Functional diarrhea: Secondary | ICD-10-CM | POA: Diagnosis not present

## 2015-11-21 DIAGNOSIS — E103412 Type 1 diabetes mellitus with severe nonproliferative diabetic retinopathy with macular edema, left eye: Secondary | ICD-10-CM | POA: Diagnosis not present

## 2015-11-24 ENCOUNTER — Other Ambulatory Visit: Payer: Self-pay | Admitting: Obstetrics & Gynecology

## 2015-11-24 DIAGNOSIS — N8500 Endometrial hyperplasia, unspecified: Secondary | ICD-10-CM | POA: Diagnosis not present

## 2015-11-24 DIAGNOSIS — R938 Abnormal findings on diagnostic imaging of other specified body structures: Secondary | ICD-10-CM | POA: Diagnosis not present

## 2015-11-24 DIAGNOSIS — N950 Postmenopausal bleeding: Secondary | ICD-10-CM | POA: Diagnosis not present

## 2015-11-24 DIAGNOSIS — N840 Polyp of corpus uteri: Secondary | ICD-10-CM | POA: Diagnosis not present

## 2015-11-30 ENCOUNTER — Other Ambulatory Visit: Payer: Self-pay

## 2015-11-30 DIAGNOSIS — Z1231 Encounter for screening mammogram for malignant neoplasm of breast: Secondary | ICD-10-CM

## 2015-12-05 DIAGNOSIS — E103412 Type 1 diabetes mellitus with severe nonproliferative diabetic retinopathy with macular edema, left eye: Secondary | ICD-10-CM | POA: Diagnosis not present

## 2015-12-07 ENCOUNTER — Encounter (INDEPENDENT_AMBULATORY_CARE_PROVIDER_SITE_OTHER): Payer: Medicare Other | Admitting: Ophthalmology

## 2015-12-07 DIAGNOSIS — H338 Other retinal detachments: Secondary | ICD-10-CM | POA: Diagnosis not present

## 2015-12-07 DIAGNOSIS — E10311 Type 1 diabetes mellitus with unspecified diabetic retinopathy with macular edema: Secondary | ICD-10-CM

## 2015-12-07 DIAGNOSIS — H43813 Vitreous degeneration, bilateral: Secondary | ICD-10-CM | POA: Diagnosis not present

## 2015-12-07 DIAGNOSIS — H47212 Primary optic atrophy, left eye: Secondary | ICD-10-CM

## 2015-12-07 DIAGNOSIS — H2702 Aphakia, left eye: Secondary | ICD-10-CM | POA: Diagnosis not present

## 2015-12-07 DIAGNOSIS — E103513 Type 1 diabetes mellitus with proliferative diabetic retinopathy with macular edema, bilateral: Secondary | ICD-10-CM

## 2015-12-08 DIAGNOSIS — F411 Generalized anxiety disorder: Secondary | ICD-10-CM | POA: Diagnosis not present

## 2015-12-19 DIAGNOSIS — E038 Other specified hypothyroidism: Secondary | ICD-10-CM | POA: Diagnosis not present

## 2015-12-19 DIAGNOSIS — E7800 Pure hypercholesterolemia, unspecified: Secondary | ICD-10-CM | POA: Diagnosis not present

## 2015-12-19 DIAGNOSIS — E109 Type 1 diabetes mellitus without complications: Secondary | ICD-10-CM | POA: Diagnosis not present

## 2015-12-21 DIAGNOSIS — Z9641 Presence of insulin pump (external) (internal): Secondary | ICD-10-CM | POA: Diagnosis not present

## 2015-12-21 DIAGNOSIS — E109 Type 1 diabetes mellitus without complications: Secondary | ICD-10-CM | POA: Diagnosis not present

## 2015-12-21 DIAGNOSIS — E038 Other specified hypothyroidism: Secondary | ICD-10-CM | POA: Diagnosis not present

## 2015-12-21 DIAGNOSIS — E7800 Pure hypercholesterolemia, unspecified: Secondary | ICD-10-CM | POA: Diagnosis not present

## 2015-12-21 DIAGNOSIS — M8589 Other specified disorders of bone density and structure, multiple sites: Secondary | ICD-10-CM | POA: Diagnosis not present

## 2016-01-02 DIAGNOSIS — L602 Onychogryphosis: Secondary | ICD-10-CM

## 2016-01-02 HISTORY — DX: Onychogryphosis: L60.2

## 2016-01-09 DIAGNOSIS — G4733 Obstructive sleep apnea (adult) (pediatric): Secondary | ICD-10-CM | POA: Diagnosis not present

## 2016-01-09 DIAGNOSIS — J301 Allergic rhinitis due to pollen: Secondary | ICD-10-CM | POA: Diagnosis not present

## 2016-01-10 ENCOUNTER — Encounter (INDEPENDENT_AMBULATORY_CARE_PROVIDER_SITE_OTHER): Payer: Medicare Other | Admitting: Ophthalmology

## 2016-01-10 DIAGNOSIS — H338 Other retinal detachments: Secondary | ICD-10-CM | POA: Diagnosis not present

## 2016-01-10 DIAGNOSIS — H47212 Primary optic atrophy, left eye: Secondary | ICD-10-CM | POA: Diagnosis not present

## 2016-01-10 DIAGNOSIS — E10311 Type 1 diabetes mellitus with unspecified diabetic retinopathy with macular edema: Secondary | ICD-10-CM | POA: Diagnosis not present

## 2016-01-10 DIAGNOSIS — H43813 Vitreous degeneration, bilateral: Secondary | ICD-10-CM | POA: Diagnosis not present

## 2016-01-10 DIAGNOSIS — H3343 Traction detachment of retina, bilateral: Secondary | ICD-10-CM | POA: Diagnosis not present

## 2016-01-10 DIAGNOSIS — E103513 Type 1 diabetes mellitus with proliferative diabetic retinopathy with macular edema, bilateral: Secondary | ICD-10-CM

## 2016-01-25 ENCOUNTER — Other Ambulatory Visit: Payer: Self-pay

## 2016-01-25 DIAGNOSIS — Z1211 Encounter for screening for malignant neoplasm of colon: Secondary | ICD-10-CM | POA: Diagnosis not present

## 2016-01-25 DIAGNOSIS — K648 Other hemorrhoids: Secondary | ICD-10-CM | POA: Diagnosis not present

## 2016-01-25 DIAGNOSIS — K591 Functional diarrhea: Secondary | ICD-10-CM | POA: Diagnosis not present

## 2016-01-25 DIAGNOSIS — F419 Anxiety disorder, unspecified: Secondary | ICD-10-CM | POA: Diagnosis not present

## 2016-01-25 DIAGNOSIS — E785 Hyperlipidemia, unspecified: Secondary | ICD-10-CM | POA: Diagnosis not present

## 2016-01-25 DIAGNOSIS — K5730 Diverticulosis of large intestine without perforation or abscess without bleeding: Secondary | ICD-10-CM | POA: Diagnosis not present

## 2016-01-25 DIAGNOSIS — Z794 Long term (current) use of insulin: Secondary | ICD-10-CM | POA: Diagnosis not present

## 2016-01-25 DIAGNOSIS — Z9049 Acquired absence of other specified parts of digestive tract: Secondary | ICD-10-CM | POA: Diagnosis not present

## 2016-01-25 DIAGNOSIS — K52839 Microscopic colitis, unspecified: Secondary | ICD-10-CM | POA: Diagnosis not present

## 2016-01-25 DIAGNOSIS — I10 Essential (primary) hypertension: Secondary | ICD-10-CM | POA: Diagnosis not present

## 2016-01-25 DIAGNOSIS — E039 Hypothyroidism, unspecified: Secondary | ICD-10-CM | POA: Diagnosis not present

## 2016-01-25 DIAGNOSIS — Z79899 Other long term (current) drug therapy: Secondary | ICD-10-CM | POA: Diagnosis not present

## 2016-01-25 DIAGNOSIS — F329 Major depressive disorder, single episode, unspecified: Secondary | ICD-10-CM | POA: Diagnosis not present

## 2016-01-25 DIAGNOSIS — M1990 Unspecified osteoarthritis, unspecified site: Secondary | ICD-10-CM | POA: Diagnosis not present

## 2016-01-25 DIAGNOSIS — E119 Type 2 diabetes mellitus without complications: Secondary | ICD-10-CM | POA: Diagnosis not present

## 2016-02-07 ENCOUNTER — Ambulatory Visit: Payer: Medicare Other | Admitting: Hematology and Oncology

## 2016-02-13 ENCOUNTER — Ambulatory Visit: Payer: Medicare Other

## 2016-02-14 ENCOUNTER — Telehealth: Payer: Self-pay | Admitting: Hematology and Oncology

## 2016-02-14 ENCOUNTER — Ambulatory Visit: Payer: Medicare Other | Admitting: Hematology and Oncology

## 2016-02-14 NOTE — Telephone Encounter (Signed)
returned call and r/s appt per pt request....pt ok and aware °

## 2016-02-14 NOTE — Assessment & Plan Note (Signed)
Left breast LCIS status post lumpectomy 12/06/2010, 1.8 cm LCIS, ER 100%, PR 59%, started tamoxifen 20 mg daily March 2012  Tamoxifen toxicities: Tolerating it very well without any major problems. Patient had an episode of slurred speech in October 2014 felt to be a TIA Patient completed 5 years of therapy and I recommended discontinuation of tamoxifen.  Breast Cancer Surveillance: 1. Breast exam  02/14/2016: Normal 2. Mammogram 02/11/2015 No abnormalities. Postsurgical changes. Breast Density Category C. I recommended that she get 3-D mammograms for surveillance.  Return to clinic in 1 year for follow-up with survivorship clinic

## 2016-02-16 DIAGNOSIS — N3000 Acute cystitis without hematuria: Secondary | ICD-10-CM | POA: Diagnosis not present

## 2016-02-16 DIAGNOSIS — R112 Nausea with vomiting, unspecified: Secondary | ICD-10-CM | POA: Diagnosis not present

## 2016-02-16 DIAGNOSIS — R5383 Other fatigue: Secondary | ICD-10-CM | POA: Diagnosis not present

## 2016-02-16 DIAGNOSIS — R197 Diarrhea, unspecified: Secondary | ICD-10-CM | POA: Diagnosis not present

## 2016-02-16 DIAGNOSIS — I10 Essential (primary) hypertension: Secondary | ICD-10-CM | POA: Diagnosis not present

## 2016-02-16 DIAGNOSIS — E039 Hypothyroidism, unspecified: Secondary | ICD-10-CM | POA: Diagnosis not present

## 2016-02-18 DIAGNOSIS — A419 Sepsis, unspecified organism: Secondary | ICD-10-CM | POA: Diagnosis not present

## 2016-02-18 DIAGNOSIS — R531 Weakness: Secondary | ICD-10-CM | POA: Diagnosis not present

## 2016-02-18 DIAGNOSIS — I10 Essential (primary) hypertension: Secondary | ICD-10-CM | POA: Diagnosis not present

## 2016-02-18 DIAGNOSIS — E860 Dehydration: Secondary | ICD-10-CM | POA: Diagnosis not present

## 2016-02-18 DIAGNOSIS — E119 Type 2 diabetes mellitus without complications: Secondary | ICD-10-CM | POA: Diagnosis not present

## 2016-02-18 DIAGNOSIS — R197 Diarrhea, unspecified: Secondary | ICD-10-CM | POA: Diagnosis not present

## 2016-02-18 DIAGNOSIS — Z882 Allergy status to sulfonamides status: Secondary | ICD-10-CM | POA: Diagnosis not present

## 2016-02-18 DIAGNOSIS — R404 Transient alteration of awareness: Secondary | ICD-10-CM | POA: Diagnosis not present

## 2016-02-21 ENCOUNTER — Encounter (INDEPENDENT_AMBULATORY_CARE_PROVIDER_SITE_OTHER): Payer: Medicare Other | Admitting: Ophthalmology

## 2016-02-21 DIAGNOSIS — H35372 Puckering of macula, left eye: Secondary | ICD-10-CM | POA: Diagnosis not present

## 2016-02-21 DIAGNOSIS — E103513 Type 1 diabetes mellitus with proliferative diabetic retinopathy with macular edema, bilateral: Secondary | ICD-10-CM | POA: Diagnosis not present

## 2016-02-21 DIAGNOSIS — E10311 Type 1 diabetes mellitus with unspecified diabetic retinopathy with macular edema: Secondary | ICD-10-CM | POA: Diagnosis not present

## 2016-02-21 DIAGNOSIS — H338 Other retinal detachments: Secondary | ICD-10-CM | POA: Diagnosis not present

## 2016-03-15 ENCOUNTER — Telehealth: Payer: Self-pay | Admitting: Hematology and Oncology

## 2016-03-15 ENCOUNTER — Ambulatory Visit (HOSPITAL_BASED_OUTPATIENT_CLINIC_OR_DEPARTMENT_OTHER): Payer: Medicare Other | Admitting: Hematology and Oncology

## 2016-03-15 ENCOUNTER — Encounter: Payer: Self-pay | Admitting: Hematology and Oncology

## 2016-03-15 VITALS — BP 124/54 | HR 71 | Temp 97.7°F | Resp 18 | Wt 167.1 lb

## 2016-03-15 DIAGNOSIS — D0502 Lobular carcinoma in situ of left breast: Secondary | ICD-10-CM | POA: Diagnosis not present

## 2016-03-15 NOTE — Progress Notes (Signed)
Patient Care Team: Ernestene Kiel, MD as PCP - General (Internal Medicine)  DIAGNOSIS: lobular carcinoma in situ 2012. Current treatment: Tamoxifen started 2012  CHIEF COMPLIANT: follow-up of tamoxifen  INTERVAL HISTORY: April Long is a 70 year old with above-mentioned history of LCIS who took tamoxifen for the past 5 years. She has done quite well from tamoxifen standpoint without any side effects. She did have 2 episodes of TIAs and I'm worried about continuation of tamoxifen beyond 5 years. She wants to lose about 15-20 pounds weight.  REVIEW OF SYSTEMS:   Constitutional: Denies fevers, chills or abnormal weight loss Eyes: Denies blurriness of vision Ears, nose, mouth, throat, and face: Denies mucositis or sore throat Respiratory: Denies cough, dyspnea or wheezes Cardiovascular: Denies palpitation, chest discomfort Gastrointestinal:  Denies nausea, heartburn or change in bowel habits Skin: Denies abnormal skin rashes Lymphatics: Denies new lymphadenopathy or easy bruising Neurological:Denies numbness, tingling or new weaknesses Behavioral/Psych: Mood is stable, no new changes  Extremities: No lower extremity edema Breast:  denies any pain or lumps or nodules in either breasts All other systems were reviewed with the patient and are negative.  I have reviewed the past medical history, past surgical history, social history and family history with the patient and they are unchanged from previous note.  ALLERGIES:  is allergic to sulfa drugs cross reactors.  MEDICATIONS:  Current Outpatient Prescriptions  Medication Sig Dispense Refill  . atorvastatin (LIPITOR) 10 MG tablet Take 10 mg by mouth daily.    Marland Kitchen co-enzyme Q-10 30 MG capsule Take 100 mg by mouth daily.     Marland Kitchen escitalopram (LEXAPRO) 20 MG tablet   0  . Insulin Aspart (NOVOLOG [place]) Inject into the skin. Insulin pump     . Levothyroxine Sodium (SYNTHROID PO) Take by mouth.      Marland Kitchen lisinopril (PRINIVIL,ZESTRIL) 10 MG  tablet Take 10 mg by mouth daily.    . Multiple Vitamin (MULTIVITAMIN) tablet Take 2 tablets by mouth daily.     . tamoxifen (NOLVADEX) 20 MG tablet Take 1 tablet (20 mg total) by mouth daily. 90 tablet 3  . VITAMIN D, CHOLECALCIFEROL, PO Take 1,000 Units by mouth.      No current facility-administered medications for this visit.    PHYSICAL EXAMINATION: ECOG PERFORMANCE STATUS: 1 - Symptomatic but completely ambulatory  Filed Vitals:   03/15/16 1056  BP: 124/54  Pulse: 71  Temp: 97.7 F (36.5 C)  Resp: 18   Filed Weights   03/15/16 1056  Weight: 167 lb 1.6 oz (75.796 kg)    GENERAL:alert, no distress and comfortable SKIN: skin color, texture, turgor are normal, no rashes or significant lesions EYES: normal, Conjunctiva are pink and non-injected, sclera clear OROPHARYNX:no exudate, no erythema and lips, buccal mucosa, and tongue normal  NECK: supple, thyroid normal size, non-tender, without nodularity LYMPH:  no palpable lymphadenopathy in the cervical, axillary or inguinal LUNGS: clear to auscultation and percussion with normal breathing effort HEART: regular rate & rhythm and no murmurs and no lower extremity edema ABDOMEN:abdomen soft, non-tender and normal bowel sounds MUSCULOSKELETAL:no cyanosis of digits and no clubbing  NEURO: alert & oriented x 3 with fluent speech, no focal motor/sensory deficits EXTREMITIES: No lower extremity edema BREAST: No palpable masses or nodules in either right or left breasts. No palpable axillary supraclavicular or infraclavicular adenopathy no breast tenderness or nipple discharge. (exam performed in the presence of a chaperone)  LABORATORY DATA:  I have reviewed the data as listed   Chemistry  Component Value Date/Time   NA 142 02/18/2013 1207   NA 140 11/29/2011 0907   K 4.1 02/18/2013 1207   K 5.3 11/29/2011 0907   CL 105 02/18/2013 1207   CL 102 11/29/2011 0907   CO2 29 02/18/2013 1207   CO2 27 11/29/2011 0907   BUN 20.1  02/18/2013 1207   BUN 28* 11/29/2011 0907   CREATININE 1.0 02/18/2013 1207   CREATININE 1.22* 11/29/2011 0907      Component Value Date/Time   CALCIUM 9.2 02/18/2013 1207   CALCIUM 9.4 11/29/2011 0907   ALKPHOS 42 02/18/2013 1207   ALKPHOS 36* 11/29/2011 0907   AST 22 02/18/2013 1207   AST 27 11/29/2011 0907   ALT 23 02/18/2013 1207   ALT 19 11/29/2011 0907   BILITOT 0.49 02/18/2013 1207   BILITOT 0.4 11/29/2011 0907       Lab Results  Component Value Date   WBC 8.7 02/18/2013   HGB 13.2 02/18/2013   HCT 39.6 02/18/2013   MCV 91.5 02/18/2013   PLT 252 02/18/2013   NEUTROABS 6.3 02/18/2013     ASSESSMENT & PLAN:  Lobular carcinoma in situ of left breast Left breast LCIS status post lumpectomy 12/06/2010, 1.8 cm LCIS, ER 100%, PR 59%, started tamoxifen 20 mg daily March 2012  Tamoxifen toxicities: Tolerating it very well without any major problems. Patient had an episode of slurred speech in October 2014 felt to be a TIA I instructed her to discontinue tamoxifen therapy since she completed 5 years of treatment.  Breast Cancer Surveillance: 1. Breast exam 03/15/2016: Normal 2. Mammogram to be done 03/20/2016.  Return to clinic For follow-up with survivorship clinic in 1 year   No orders of the defined types were placed in this encounter.   The patient has a good understanding of the overall plan. she agrees with it. she will call with any problems that may develop before the next visit here.   Rulon Eisenmenger, MD 03/15/2016

## 2016-03-15 NOTE — Telephone Encounter (Signed)
appt made and avs printed °

## 2016-03-15 NOTE — Assessment & Plan Note (Signed)
Left breast LCIS status post lumpectomy 12/06/2010, 1.8 cm LCIS, ER 100%, PR 59%, started tamoxifen 20 mg daily March 2012  Tamoxifen toxicities: Tolerating it very well without any major problems. Patient had an episode of slurred speech in October 2014 felt to be a TIA I instructed her to discontinue tamoxifen therapy since she completed 5 years of treatment.  Breast Cancer Surveillance: 1. Breast exam 03/15/2016: Normal 2. Mammogram to be done 03/20/2016.  Return to clinic as needed.

## 2016-03-16 ENCOUNTER — Ambulatory Visit: Payer: Medicare Other

## 2016-03-20 ENCOUNTER — Ambulatory Visit
Admission: RE | Admit: 2016-03-20 | Discharge: 2016-03-20 | Disposition: A | Discharge status: Home / Self Care | Payer: Medicare Other | Source: Ambulatory Visit

## 2016-03-20 DIAGNOSIS — Z1231 Encounter for screening mammogram for malignant neoplasm of breast: Secondary | ICD-10-CM

## 2016-03-23 DIAGNOSIS — E038 Other specified hypothyroidism: Secondary | ICD-10-CM | POA: Diagnosis not present

## 2016-03-23 DIAGNOSIS — E7800 Pure hypercholesterolemia, unspecified: Secondary | ICD-10-CM | POA: Diagnosis not present

## 2016-03-23 DIAGNOSIS — E109 Type 1 diabetes mellitus without complications: Secondary | ICD-10-CM | POA: Diagnosis not present

## 2016-03-27 DIAGNOSIS — E109 Type 1 diabetes mellitus without complications: Secondary | ICD-10-CM | POA: Diagnosis not present

## 2016-03-27 DIAGNOSIS — E038 Other specified hypothyroidism: Secondary | ICD-10-CM | POA: Diagnosis not present

## 2016-03-27 DIAGNOSIS — Z9641 Presence of insulin pump (external) (internal): Secondary | ICD-10-CM | POA: Diagnosis not present

## 2016-03-27 DIAGNOSIS — E7800 Pure hypercholesterolemia, unspecified: Secondary | ICD-10-CM | POA: Diagnosis not present

## 2016-03-27 DIAGNOSIS — M8589 Other specified disorders of bone density and structure, multiple sites: Secondary | ICD-10-CM | POA: Diagnosis not present

## 2016-04-05 DIAGNOSIS — G4733 Obstructive sleep apnea (adult) (pediatric): Secondary | ICD-10-CM | POA: Diagnosis not present

## 2016-04-11 DIAGNOSIS — J301 Allergic rhinitis due to pollen: Secondary | ICD-10-CM | POA: Diagnosis not present

## 2016-04-11 DIAGNOSIS — G4733 Obstructive sleep apnea (adult) (pediatric): Secondary | ICD-10-CM | POA: Diagnosis not present

## 2016-04-17 ENCOUNTER — Encounter (INDEPENDENT_AMBULATORY_CARE_PROVIDER_SITE_OTHER): Payer: Medicare Other | Admitting: Ophthalmology

## 2016-04-17 DIAGNOSIS — H43813 Vitreous degeneration, bilateral: Secondary | ICD-10-CM

## 2016-04-17 DIAGNOSIS — H338 Other retinal detachments: Secondary | ICD-10-CM

## 2016-04-17 DIAGNOSIS — E10311 Type 1 diabetes mellitus with unspecified diabetic retinopathy with macular edema: Secondary | ICD-10-CM | POA: Diagnosis not present

## 2016-04-17 DIAGNOSIS — E103513 Type 1 diabetes mellitus with proliferative diabetic retinopathy with macular edema, bilateral: Secondary | ICD-10-CM

## 2016-04-17 DIAGNOSIS — H35033 Hypertensive retinopathy, bilateral: Secondary | ICD-10-CM

## 2016-04-17 DIAGNOSIS — I10 Essential (primary) hypertension: Secondary | ICD-10-CM

## 2016-05-15 DIAGNOSIS — Z01419 Encounter for gynecological examination (general) (routine) without abnormal findings: Secondary | ICD-10-CM | POA: Diagnosis not present

## 2016-05-15 DIAGNOSIS — N958 Other specified menopausal and perimenopausal disorders: Secondary | ICD-10-CM | POA: Diagnosis not present

## 2016-05-15 DIAGNOSIS — Z124 Encounter for screening for malignant neoplasm of cervix: Secondary | ICD-10-CM | POA: Diagnosis not present

## 2016-05-15 DIAGNOSIS — Z779 Other contact with and (suspected) exposures hazardous to health: Secondary | ICD-10-CM | POA: Diagnosis not present

## 2016-05-15 DIAGNOSIS — Z6826 Body mass index (BMI) 26.0-26.9, adult: Secondary | ICD-10-CM | POA: Diagnosis not present

## 2016-05-15 DIAGNOSIS — M8588 Other specified disorders of bone density and structure, other site: Secondary | ICD-10-CM | POA: Diagnosis not present

## 2016-05-23 DIAGNOSIS — G4733 Obstructive sleep apnea (adult) (pediatric): Secondary | ICD-10-CM | POA: Diagnosis not present

## 2016-05-23 DIAGNOSIS — J301 Allergic rhinitis due to pollen: Secondary | ICD-10-CM | POA: Diagnosis not present

## 2016-05-24 DIAGNOSIS — H47212 Primary optic atrophy, left eye: Secondary | ICD-10-CM | POA: Diagnosis not present

## 2016-05-24 DIAGNOSIS — E103412 Type 1 diabetes mellitus with severe nonproliferative diabetic retinopathy with macular edema, left eye: Secondary | ICD-10-CM | POA: Diagnosis not present

## 2016-05-29 DIAGNOSIS — G4733 Obstructive sleep apnea (adult) (pediatric): Secondary | ICD-10-CM | POA: Diagnosis not present

## 2016-06-11 DIAGNOSIS — F411 Generalized anxiety disorder: Secondary | ICD-10-CM | POA: Diagnosis not present

## 2016-06-12 ENCOUNTER — Encounter (INDEPENDENT_AMBULATORY_CARE_PROVIDER_SITE_OTHER): Payer: Medicare Other | Admitting: Ophthalmology

## 2016-06-12 DIAGNOSIS — E113511 Type 2 diabetes mellitus with proliferative diabetic retinopathy with macular edema, right eye: Secondary | ICD-10-CM | POA: Diagnosis not present

## 2016-06-12 DIAGNOSIS — I10 Essential (primary) hypertension: Secondary | ICD-10-CM

## 2016-06-12 DIAGNOSIS — H43813 Vitreous degeneration, bilateral: Secondary | ICD-10-CM | POA: Diagnosis not present

## 2016-06-12 DIAGNOSIS — E11311 Type 2 diabetes mellitus with unspecified diabetic retinopathy with macular edema: Secondary | ICD-10-CM | POA: Diagnosis not present

## 2016-06-12 DIAGNOSIS — E113592 Type 2 diabetes mellitus with proliferative diabetic retinopathy without macular edema, left eye: Secondary | ICD-10-CM

## 2016-06-12 DIAGNOSIS — L821 Other seborrheic keratosis: Secondary | ICD-10-CM | POA: Diagnosis not present

## 2016-06-12 DIAGNOSIS — H338 Other retinal detachments: Secondary | ICD-10-CM | POA: Diagnosis not present

## 2016-06-12 DIAGNOSIS — H35033 Hypertensive retinopathy, bilateral: Secondary | ICD-10-CM | POA: Diagnosis not present

## 2016-06-21 ENCOUNTER — Other Ambulatory Visit: Payer: Self-pay

## 2016-07-03 DIAGNOSIS — E7800 Pure hypercholesterolemia, unspecified: Secondary | ICD-10-CM | POA: Diagnosis not present

## 2016-07-03 DIAGNOSIS — E109 Type 1 diabetes mellitus without complications: Secondary | ICD-10-CM | POA: Diagnosis not present

## 2016-07-03 DIAGNOSIS — E038 Other specified hypothyroidism: Secondary | ICD-10-CM | POA: Diagnosis not present

## 2016-07-09 DIAGNOSIS — Z9641 Presence of insulin pump (external) (internal): Secondary | ICD-10-CM | POA: Diagnosis not present

## 2016-07-09 DIAGNOSIS — E038 Other specified hypothyroidism: Secondary | ICD-10-CM | POA: Diagnosis not present

## 2016-07-09 DIAGNOSIS — E109 Type 1 diabetes mellitus without complications: Secondary | ICD-10-CM | POA: Diagnosis not present

## 2016-07-09 DIAGNOSIS — M8589 Other specified disorders of bone density and structure, multiple sites: Secondary | ICD-10-CM | POA: Diagnosis not present

## 2016-07-09 DIAGNOSIS — E7800 Pure hypercholesterolemia, unspecified: Secondary | ICD-10-CM | POA: Diagnosis not present

## 2016-08-16 ENCOUNTER — Encounter (INDEPENDENT_AMBULATORY_CARE_PROVIDER_SITE_OTHER): Payer: Medicare Other | Admitting: Ophthalmology

## 2016-08-17 ENCOUNTER — Encounter (INDEPENDENT_AMBULATORY_CARE_PROVIDER_SITE_OTHER): Payer: Medicare Other | Admitting: Ophthalmology

## 2016-08-17 DIAGNOSIS — I10 Essential (primary) hypertension: Secondary | ICD-10-CM

## 2016-08-17 DIAGNOSIS — H338 Other retinal detachments: Secondary | ICD-10-CM | POA: Diagnosis not present

## 2016-08-17 DIAGNOSIS — E103513 Type 1 diabetes mellitus with proliferative diabetic retinopathy with macular edema, bilateral: Secondary | ICD-10-CM

## 2016-08-17 DIAGNOSIS — E10311 Type 1 diabetes mellitus with unspecified diabetic retinopathy with macular edema: Secondary | ICD-10-CM

## 2016-08-17 DIAGNOSIS — H47212 Primary optic atrophy, left eye: Secondary | ICD-10-CM

## 2016-08-17 DIAGNOSIS — H35033 Hypertensive retinopathy, bilateral: Secondary | ICD-10-CM

## 2016-08-23 DIAGNOSIS — J301 Allergic rhinitis due to pollen: Secondary | ICD-10-CM | POA: Diagnosis not present

## 2016-08-23 DIAGNOSIS — Z23 Encounter for immunization: Secondary | ICD-10-CM | POA: Diagnosis not present

## 2016-08-23 DIAGNOSIS — G4733 Obstructive sleep apnea (adult) (pediatric): Secondary | ICD-10-CM | POA: Diagnosis not present

## 2016-08-27 DIAGNOSIS — G4733 Obstructive sleep apnea (adult) (pediatric): Secondary | ICD-10-CM | POA: Diagnosis not present

## 2016-08-30 DIAGNOSIS — N3000 Acute cystitis without hematuria: Secondary | ICD-10-CM | POA: Diagnosis not present

## 2016-08-30 DIAGNOSIS — R300 Dysuria: Secondary | ICD-10-CM | POA: Diagnosis not present

## 2016-08-31 DIAGNOSIS — Z23 Encounter for immunization: Secondary | ICD-10-CM | POA: Diagnosis not present

## 2016-09-03 DIAGNOSIS — R87612 Low grade squamous intraepithelial lesion on cytologic smear of cervix (LGSIL): Secondary | ICD-10-CM | POA: Diagnosis not present

## 2016-09-03 DIAGNOSIS — Z779 Other contact with and (suspected) exposures hazardous to health: Secondary | ICD-10-CM | POA: Diagnosis not present

## 2016-10-23 DIAGNOSIS — J111 Influenza due to unidentified influenza virus with other respiratory manifestations: Secondary | ICD-10-CM | POA: Diagnosis not present

## 2016-11-02 ENCOUNTER — Encounter (INDEPENDENT_AMBULATORY_CARE_PROVIDER_SITE_OTHER): Payer: Medicare Other | Admitting: Ophthalmology

## 2016-11-02 DIAGNOSIS — E103513 Type 1 diabetes mellitus with proliferative diabetic retinopathy with macular edema, bilateral: Secondary | ICD-10-CM

## 2016-11-02 DIAGNOSIS — H2702 Aphakia, left eye: Secondary | ICD-10-CM

## 2016-11-02 DIAGNOSIS — H43813 Vitreous degeneration, bilateral: Secondary | ICD-10-CM | POA: Diagnosis not present

## 2016-11-02 DIAGNOSIS — H47212 Primary optic atrophy, left eye: Secondary | ICD-10-CM | POA: Diagnosis not present

## 2016-11-02 DIAGNOSIS — E10311 Type 1 diabetes mellitus with unspecified diabetic retinopathy with macular edema: Secondary | ICD-10-CM | POA: Diagnosis not present

## 2016-11-02 DIAGNOSIS — H338 Other retinal detachments: Secondary | ICD-10-CM | POA: Diagnosis not present

## 2016-11-02 DIAGNOSIS — I10 Essential (primary) hypertension: Secondary | ICD-10-CM

## 2016-11-02 DIAGNOSIS — H35033 Hypertensive retinopathy, bilateral: Secondary | ICD-10-CM | POA: Diagnosis not present

## 2016-11-14 DIAGNOSIS — E782 Mixed hyperlipidemia: Secondary | ICD-10-CM | POA: Diagnosis not present

## 2016-11-14 DIAGNOSIS — E039 Hypothyroidism, unspecified: Secondary | ICD-10-CM | POA: Diagnosis not present

## 2016-11-14 DIAGNOSIS — E109 Type 1 diabetes mellitus without complications: Secondary | ICD-10-CM | POA: Diagnosis not present

## 2016-11-20 DIAGNOSIS — M8589 Other specified disorders of bone density and structure, multiple sites: Secondary | ICD-10-CM | POA: Insufficient documentation

## 2016-11-20 DIAGNOSIS — E109 Type 1 diabetes mellitus without complications: Secondary | ICD-10-CM

## 2016-11-20 DIAGNOSIS — E7800 Pure hypercholesterolemia, unspecified: Secondary | ICD-10-CM

## 2016-11-20 DIAGNOSIS — Z794 Long term (current) use of insulin: Secondary | ICD-10-CM | POA: Diagnosis not present

## 2016-11-20 DIAGNOSIS — Z9641 Presence of insulin pump (external) (internal): Secondary | ICD-10-CM | POA: Insufficient documentation

## 2016-11-20 DIAGNOSIS — E039 Hypothyroidism, unspecified: Secondary | ICD-10-CM

## 2016-11-20 HISTORY — DX: Hypothyroidism, unspecified: E03.9

## 2016-11-20 HISTORY — DX: Presence of insulin pump (external) (internal): Z96.41

## 2016-11-20 HISTORY — DX: Other specified disorders of bone density and structure, multiple sites: M85.89

## 2016-11-20 HISTORY — DX: Pure hypercholesterolemia, unspecified: E78.00

## 2016-11-20 HISTORY — DX: Type 1 diabetes mellitus without complications: E10.9

## 2016-12-06 DIAGNOSIS — F411 Generalized anxiety disorder: Secondary | ICD-10-CM | POA: Diagnosis not present

## 2017-02-08 ENCOUNTER — Encounter (INDEPENDENT_AMBULATORY_CARE_PROVIDER_SITE_OTHER): Payer: Medicare Other | Admitting: Ophthalmology

## 2017-02-08 DIAGNOSIS — E10311 Type 1 diabetes mellitus with unspecified diabetic retinopathy with macular edema: Secondary | ICD-10-CM | POA: Diagnosis not present

## 2017-02-08 DIAGNOSIS — E103513 Type 1 diabetes mellitus with proliferative diabetic retinopathy with macular edema, bilateral: Secondary | ICD-10-CM

## 2017-02-08 DIAGNOSIS — H338 Other retinal detachments: Secondary | ICD-10-CM | POA: Diagnosis not present

## 2017-02-08 DIAGNOSIS — H35373 Puckering of macula, bilateral: Secondary | ICD-10-CM | POA: Diagnosis not present

## 2017-02-08 DIAGNOSIS — E103531 Type 1 diabetes mellitus with proliferative diabetic retinopathy with traction retinal detachment not involving the macula, right eye: Secondary | ICD-10-CM

## 2017-02-08 DIAGNOSIS — H2702 Aphakia, left eye: Secondary | ICD-10-CM | POA: Diagnosis not present

## 2017-02-08 DIAGNOSIS — H43813 Vitreous degeneration, bilateral: Secondary | ICD-10-CM | POA: Diagnosis not present

## 2017-02-08 DIAGNOSIS — E7800 Pure hypercholesterolemia, unspecified: Secondary | ICD-10-CM | POA: Diagnosis not present

## 2017-02-08 DIAGNOSIS — H35033 Hypertensive retinopathy, bilateral: Secondary | ICD-10-CM

## 2017-02-08 DIAGNOSIS — E109 Type 1 diabetes mellitus without complications: Secondary | ICD-10-CM | POA: Diagnosis not present

## 2017-02-08 DIAGNOSIS — I10 Essential (primary) hypertension: Secondary | ICD-10-CM | POA: Diagnosis not present

## 2017-02-08 DIAGNOSIS — M8589 Other specified disorders of bone density and structure, multiple sites: Secondary | ICD-10-CM | POA: Diagnosis not present

## 2017-02-08 DIAGNOSIS — E039 Hypothyroidism, unspecified: Secondary | ICD-10-CM | POA: Diagnosis not present

## 2017-02-19 DIAGNOSIS — E039 Hypothyroidism, unspecified: Secondary | ICD-10-CM | POA: Diagnosis not present

## 2017-02-19 DIAGNOSIS — M8589 Other specified disorders of bone density and structure, multiple sites: Secondary | ICD-10-CM | POA: Diagnosis not present

## 2017-02-19 DIAGNOSIS — Z9641 Presence of insulin pump (external) (internal): Secondary | ICD-10-CM | POA: Diagnosis not present

## 2017-02-19 DIAGNOSIS — Z87891 Personal history of nicotine dependence: Secondary | ICD-10-CM | POA: Diagnosis not present

## 2017-02-19 DIAGNOSIS — E109 Type 1 diabetes mellitus without complications: Secondary | ICD-10-CM | POA: Diagnosis not present

## 2017-02-19 DIAGNOSIS — E7800 Pure hypercholesterolemia, unspecified: Secondary | ICD-10-CM | POA: Diagnosis not present

## 2017-02-20 DIAGNOSIS — G4733 Obstructive sleep apnea (adult) (pediatric): Secondary | ICD-10-CM | POA: Diagnosis not present

## 2017-02-21 DIAGNOSIS — G4733 Obstructive sleep apnea (adult) (pediatric): Secondary | ICD-10-CM | POA: Diagnosis not present

## 2017-02-21 DIAGNOSIS — J301 Allergic rhinitis due to pollen: Secondary | ICD-10-CM | POA: Diagnosis not present

## 2017-03-12 ENCOUNTER — Encounter: Payer: Medicare Other | Admitting: Nurse Practitioner

## 2017-03-12 ENCOUNTER — Ambulatory Visit (HOSPITAL_BASED_OUTPATIENT_CLINIC_OR_DEPARTMENT_OTHER): Payer: Medicare Other

## 2017-03-12 ENCOUNTER — Encounter: Payer: Self-pay | Admitting: Adult Health

## 2017-03-12 ENCOUNTER — Other Ambulatory Visit: Payer: Self-pay | Admitting: *Deleted

## 2017-03-12 ENCOUNTER — Telehealth: Payer: Self-pay | Admitting: *Deleted

## 2017-03-12 ENCOUNTER — Ambulatory Visit (HOSPITAL_BASED_OUTPATIENT_CLINIC_OR_DEPARTMENT_OTHER): Payer: Medicare Other | Admitting: Adult Health

## 2017-03-12 VITALS — BP 131/43 | HR 86 | Temp 98.2°F | Resp 19 | Ht 67.0 in | Wt 171.3 lb

## 2017-03-12 DIAGNOSIS — R300 Dysuria: Secondary | ICD-10-CM

## 2017-03-12 DIAGNOSIS — Z86000 Personal history of in-situ neoplasm of breast: Secondary | ICD-10-CM

## 2017-03-12 DIAGNOSIS — R3915 Urgency of urination: Secondary | ICD-10-CM | POA: Diagnosis not present

## 2017-03-12 DIAGNOSIS — D0502 Lobular carcinoma in situ of left breast: Secondary | ICD-10-CM

## 2017-03-12 DIAGNOSIS — Z1239 Encounter for other screening for malignant neoplasm of breast: Secondary | ICD-10-CM

## 2017-03-12 LAB — URINALYSIS, MICROSCOPIC - CHCC
Bilirubin (Urine): NEGATIVE
Blood: NEGATIVE
GLUCOSE UR CHCC: NEGATIVE mg/dL
KETONES: NEGATIVE mg/dL
Nitrite: NEGATIVE
Protein: NEGATIVE mg/dL
RBC / HPF: NEGATIVE (ref 0–2)
Specific Gravity, Urine: 1.015 (ref 1.003–1.035)
UROBILINOGEN UR: 0.2 mg/dL (ref 0.2–1)
pH: 6 (ref 4.6–8.0)

## 2017-03-12 MED ORDER — CIPROFLOXACIN HCL 250 MG PO TABS: 250.0000 mg | ORAL_TABLET | Freq: Two times a day (BID) | ORAL | 0 refills | Status: AC

## 2017-03-12 NOTE — Progress Notes (Signed)
CLINIC:  Survivorship   REASON FOR VISIT:  Routine follow-up for history of breast cancer.   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  Overall, she reports feeling quite well.  She is doing well today.  She has been having some dysuria and is concerned she may have a UTI.  She says that her PCP office wouldn't run it for her today.  She denies any issues with her breasts or further concerns.      REVIEW OF SYSTEMS:  Review of Systems  Constitutional: Negative for chills, fever and malaise/fatigue.  HENT: Negative for hearing loss.   Eyes: Negative for blurred vision and double vision.  Respiratory: Negative for cough and shortness of breath.   Cardiovascular: Negative for chest pain, palpitations and leg swelling.  Gastrointestinal: Negative for abdominal pain, constipation, diarrhea, heartburn, nausea and vomiting.  Genitourinary: Positive for dysuria and urgency. Negative for flank pain, frequency and hematuria.  Neurological: Negative for dizziness and headaches.  Endo/Heme/Allergies: Negative for environmental allergies. Does not bruise/bleed easily.  Psychiatric/Behavioral: Negative for depression. The patient is not nervous/anxious.   Breast: Denies any new nodularity, masses, tenderness, nipple changes, or nipple discharge.      PAST MEDICAL/SURGICAL HISTORY:  Past Medical History:  Diagnosis Date  . 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 Cancer ([place])   . Diabetes mellitus    type 1, for almost 48 years  . 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 Osteoporosis    early stage  . Slurring of speech 08/03/2013  . Syncope 08/03/2013  . Thyroid disease    Past Surgical History:  Procedure Laterality Date  . BREAST SURGERY   2012   lumpectomy  . EYE SURGERY  1983   retinal reattachment  . TUBAL LIGATION  1974     ALLERGIES:  Allergies  Allergen Reactions  . Sulfa Drugs Cross Reactors Rash    On chest only     CURRENT MEDICATIONS:  Outpatient Encounter Prescriptions as of 03/12/2017  Medication Sig Note  . atorvastatin (LIPITOR) 10 MG tablet Take 10 mg by mouth daily.   Marland Kitchen co-enzyme Q-10 30 MG capsule Take 100 mg by mouth daily.  02/18/2013: Taking once daily  . escitalopram (LEXAPRO) 20 MG tablet  02/17/2015: Received from: External Pharmacy  . Insulin Aspart (NOVOLOG [place]) Inject into the skin. Insulin pump    . levothyroxine (SYNTHROID) 50 MCG tablet Take 1 tablet by mouth as directed.   Marland Kitchen lisinopril (PRINIVIL,ZESTRIL) 10 MG tablet Take 10 mg by mouth daily.   . Multiple Vitamin (MULTIVITAMIN) tablet Take 2 tablets by mouth daily.    Marland Kitchen VITAMIN D, CHOLECALCIFEROL, PO Take 1,000 Units by mouth.    . [DISCONTINUED] Levothyroxine Sodium (SYNTHROID PO) Take by mouth.      No facility-administered encounter medications on file as of 03/12/2017.      ONCOLOGIC FAMILY HISTORY:  Family History  Problem Relation Age of Onset  . Alzheimer's disease Mother   . Heart disease Father   . Heart failure Father     SOCIAL HISTORY:  April April Long is married and lives with her husband in [place], [place]. .  April April Long is currently retired.  She denies any current or history of tobacco, alcohol, or  illicit drug use.     PHYSICAL EXAMINATION:  Vital Signs: Vitals:   03/12/17 1405  BP: (!) 131/43  Pulse: 86  Resp: 19  Temp: 98.2 F (36.8 C)   Filed Weights   03/12/17 1405  Weight: 171 lb 4.8 oz (77.7 kg)   General: Well-nourished, well-appearing female in no acute distress.  Unaccompanied today.   HEENT: Head is normocephalic.  Pupils equal and reactive to light. Conjunctivae clear without exudate.  Sclerae anicteric. Oral mucosa is pink, moist.  Oropharynx is pink without lesions or erythema.    Lymph: No cervical, supraclavicular, or infraclavicular lymphadenopathy noted on palpation.  Cardiovascular: Regular rate and rhythm.Marland Kitchen Respiratory: Clear to auscultation bilaterally. Chest expansion symmetric; breathing non-labored.  Breast Exam:  -Left breast: No appreciable masses on palpation. No skin redness, thickening, or peau d'orange appearance; no nipple retraction or nipple discharge; mild distortion in symmetry at previous lumpectomy site well healed scar without erythema or nodularity.  -Right breast: No appreciable masses on palpation. No skin redness, thickening, or peau d'orange appearance; no nipple retraction or nipple discharge;-Axilla: No axillary adenopathy bilaterally.  GI: Abdomen soft and round; non-tender, non-distended. Bowel sounds normoactive. No hepatosplenomegaly.   GU: Deferred.  Neuro: No focal deficits. Steady gait.  Psych: Mood and affect normal and appropriate for situation.  Extremities: No edema. Skin: Warm and dry.  LABORATORY DATA:  None for this visit   DIAGNOSTIC IMAGING:  Most recent mammogram:      ASSESSMENT AND PLAN:  Ms.. April Long is a pleasant 71 y.o. female with history of LCIS in 2012, treated with lumpectomy and 5 years of tamoxifen.  She [facility] for surveillance and routine follow-up.   1. History of LCIS:  April April Long is currently clinically and radiographically without evidence of disease or recurrence of LCIS. She will be due for mammogram in 02/2016; orders placed today.  I encouraged her to call me with any questions or concerns before her next visit at the cancer center, and I would be happy to see her sooner, if needed.    2. Cancer screening:  Due to April April Long's history and her age, she should receive screening for skin cancers, colon cancer. She was encouraged to follow-up with her PCP for appropriate cancer screenings.   3. Health maintenance and wellness promotion: April April Long was encouraged to  consume 5-7 servings of fruits and vegetables per day. She was also encouraged to engage in moderate to vigorous exercise for 30 minutes per day most days of the week. She was instructed to limit her alcohol consumption and continue to abstain from tobacco use.    Dispo:  -Return to cancer center in one year for LTS follow up   A total of (30) minutes of face-to-face time was spent with this patient with greater than 50% of that time in counseling and care-coordination.   Gardenia Phlegm, NP Survivorship Program [facility] [phone]   Note: PRIMARY CARE PROVIDER Ernestene Kiel, [place] [phone].

## 2017-03-12 NOTE — Telephone Encounter (Signed)
As noted below by Mendel Ryder, NP, I called and left a message on patient's cell phone informing her that she has a UTI. I am E-scribing Cipro 250 mg bid x 5 days to her pharmacy in [place]. Instructed her to cal [facility] if she has any questions or concerns.

## 2017-03-12 NOTE — Telephone Encounter (Signed)
-----   Message from Gardenia Phlegm, NP sent at 03/12/2017  4:43 PM EDT ----- Looks like patient has uti.  Please call in cipro 250mg  x 5 days, disp 10 no refills

## 2017-03-15 LAB — URINE CULTURE

## 2017-04-11 DIAGNOSIS — E1069 Type 1 diabetes mellitus with other specified complication: Secondary | ICD-10-CM | POA: Diagnosis not present

## 2017-04-11 DIAGNOSIS — E109 Type 1 diabetes mellitus without complications: Secondary | ICD-10-CM | POA: Diagnosis not present

## 2017-05-03 ENCOUNTER — Encounter (INDEPENDENT_AMBULATORY_CARE_PROVIDER_SITE_OTHER): Payer: Medicare Other | Admitting: Ophthalmology

## 2017-05-09 ENCOUNTER — Encounter (INDEPENDENT_AMBULATORY_CARE_PROVIDER_SITE_OTHER): Payer: Medicare Other | Admitting: Ophthalmology

## 2017-05-09 DIAGNOSIS — E10319 Type 1 diabetes mellitus with unspecified diabetic retinopathy without macular edema: Secondary | ICD-10-CM

## 2017-05-09 DIAGNOSIS — H35033 Hypertensive retinopathy, bilateral: Secondary | ICD-10-CM | POA: Diagnosis not present

## 2017-05-09 DIAGNOSIS — I10 Essential (primary) hypertension: Secondary | ICD-10-CM

## 2017-05-09 DIAGNOSIS — E103593 Type 1 diabetes mellitus with proliferative diabetic retinopathy without macular edema, bilateral: Secondary | ICD-10-CM | POA: Diagnosis not present

## 2017-05-09 DIAGNOSIS — M1712 Unilateral primary osteoarthritis, left knee: Secondary | ICD-10-CM | POA: Diagnosis not present

## 2017-05-09 DIAGNOSIS — M25561 Pain in right knee: Secondary | ICD-10-CM | POA: Diagnosis not present

## 2017-05-09 DIAGNOSIS — M25562 Pain in left knee: Secondary | ICD-10-CM | POA: Diagnosis not present

## 2017-05-09 DIAGNOSIS — H338 Other retinal detachments: Secondary | ICD-10-CM

## 2017-05-09 DIAGNOSIS — G8929 Other chronic pain: Secondary | ICD-10-CM | POA: Diagnosis not present

## 2017-05-09 DIAGNOSIS — H43813 Vitreous degeneration, bilateral: Secondary | ICD-10-CM | POA: Diagnosis not present

## 2017-05-14 DIAGNOSIS — H47212 Primary optic atrophy, left eye: Secondary | ICD-10-CM | POA: Diagnosis not present

## 2017-05-14 DIAGNOSIS — E103412 Type 1 diabetes mellitus with severe nonproliferative diabetic retinopathy with macular edema, left eye: Secondary | ICD-10-CM | POA: Diagnosis not present

## 2017-05-22 DIAGNOSIS — M25661 Stiffness of right knee, not elsewhere classified: Secondary | ICD-10-CM | POA: Diagnosis not present

## 2017-05-22 DIAGNOSIS — M25662 Stiffness of left knee, not elsewhere classified: Secondary | ICD-10-CM | POA: Diagnosis not present

## 2017-05-22 DIAGNOSIS — M25562 Pain in left knee: Secondary | ICD-10-CM | POA: Diagnosis not present

## 2017-05-22 DIAGNOSIS — M25561 Pain in right knee: Secondary | ICD-10-CM | POA: Diagnosis not present

## 2017-05-24 DIAGNOSIS — M25661 Stiffness of right knee, not elsewhere classified: Secondary | ICD-10-CM | POA: Diagnosis not present

## 2017-05-24 DIAGNOSIS — M25662 Stiffness of left knee, not elsewhere classified: Secondary | ICD-10-CM | POA: Diagnosis not present

## 2017-05-24 DIAGNOSIS — M25562 Pain in left knee: Secondary | ICD-10-CM | POA: Diagnosis not present

## 2017-05-24 DIAGNOSIS — M25561 Pain in right knee: Secondary | ICD-10-CM | POA: Diagnosis not present

## 2017-05-27 DIAGNOSIS — M25661 Stiffness of right knee, not elsewhere classified: Secondary | ICD-10-CM | POA: Diagnosis not present

## 2017-05-27 DIAGNOSIS — M25561 Pain in right knee: Secondary | ICD-10-CM | POA: Diagnosis not present

## 2017-05-27 DIAGNOSIS — M25662 Stiffness of left knee, not elsewhere classified: Secondary | ICD-10-CM | POA: Diagnosis not present

## 2017-05-27 DIAGNOSIS — M25562 Pain in left knee: Secondary | ICD-10-CM | POA: Diagnosis not present

## 2017-05-28 DIAGNOSIS — E7800 Pure hypercholesterolemia, unspecified: Secondary | ICD-10-CM | POA: Diagnosis not present

## 2017-05-28 DIAGNOSIS — M8589 Other specified disorders of bone density and structure, multiple sites: Secondary | ICD-10-CM | POA: Diagnosis not present

## 2017-05-28 DIAGNOSIS — E039 Hypothyroidism, unspecified: Secondary | ICD-10-CM | POA: Diagnosis not present

## 2017-05-28 DIAGNOSIS — E109 Type 1 diabetes mellitus without complications: Secondary | ICD-10-CM | POA: Diagnosis not present

## 2017-05-28 DIAGNOSIS — Z9641 Presence of insulin pump (external) (internal): Secondary | ICD-10-CM | POA: Diagnosis not present

## 2017-05-30 DIAGNOSIS — M25561 Pain in right knee: Secondary | ICD-10-CM | POA: Diagnosis not present

## 2017-06-04 DIAGNOSIS — M1711 Unilateral primary osteoarthritis, right knee: Secondary | ICD-10-CM | POA: Diagnosis not present

## 2017-06-04 DIAGNOSIS — M25561 Pain in right knee: Secondary | ICD-10-CM | POA: Diagnosis not present

## 2017-06-04 DIAGNOSIS — F411 Generalized anxiety disorder: Secondary | ICD-10-CM | POA: Diagnosis not present

## 2017-06-04 DIAGNOSIS — G8929 Other chronic pain: Secondary | ICD-10-CM | POA: Diagnosis not present

## 2017-06-04 DIAGNOSIS — M1712 Unilateral primary osteoarthritis, left knee: Secondary | ICD-10-CM | POA: Diagnosis not present

## 2017-06-17 ENCOUNTER — Ambulatory Visit
Admission: RE | Admit: 2017-06-17 | Discharge: 2017-06-17 | Disposition: A | Discharge status: Home / Self Care | Payer: Medicare Other | Source: Ambulatory Visit | Attending: Adult Health | Admitting: Adult Health

## 2017-06-17 DIAGNOSIS — Z1231 Encounter for screening mammogram for malignant neoplasm of breast: Secondary | ICD-10-CM | POA: Diagnosis not present

## 2017-06-17 DIAGNOSIS — F411 Generalized anxiety disorder: Secondary | ICD-10-CM | POA: Diagnosis not present

## 2017-06-17 DIAGNOSIS — Z1239 Encounter for other screening for malignant neoplasm of breast: Secondary | ICD-10-CM

## 2017-06-18 DIAGNOSIS — N3001 Acute cystitis with hematuria: Secondary | ICD-10-CM | POA: Diagnosis not present

## 2017-06-18 DIAGNOSIS — R300 Dysuria: Secondary | ICD-10-CM | POA: Diagnosis not present

## 2017-06-25 DIAGNOSIS — M1711 Unilateral primary osteoarthritis, right knee: Secondary | ICD-10-CM | POA: Diagnosis not present

## 2017-06-25 DIAGNOSIS — M1712 Unilateral primary osteoarthritis, left knee: Secondary | ICD-10-CM | POA: Diagnosis not present

## 2017-06-25 DIAGNOSIS — S93492D Sprain of other ligament of left ankle, subsequent encounter: Secondary | ICD-10-CM | POA: Diagnosis not present

## 2017-07-01 DIAGNOSIS — F411 Generalized anxiety disorder: Secondary | ICD-10-CM | POA: Diagnosis not present

## 2017-07-10 DIAGNOSIS — M25661 Stiffness of right knee, not elsewhere classified: Secondary | ICD-10-CM | POA: Diagnosis not present

## 2017-07-10 DIAGNOSIS — M25561 Pain in right knee: Secondary | ICD-10-CM | POA: Diagnosis not present

## 2017-07-10 DIAGNOSIS — M25562 Pain in left knee: Secondary | ICD-10-CM | POA: Diagnosis not present

## 2017-07-10 DIAGNOSIS — M25662 Stiffness of left knee, not elsewhere classified: Secondary | ICD-10-CM | POA: Diagnosis not present

## 2017-08-08 ENCOUNTER — Encounter (INDEPENDENT_AMBULATORY_CARE_PROVIDER_SITE_OTHER): Payer: Medicare Other | Admitting: Ophthalmology

## 2017-08-08 DIAGNOSIS — H35033 Hypertensive retinopathy, bilateral: Secondary | ICD-10-CM

## 2017-08-08 DIAGNOSIS — E10311 Type 1 diabetes mellitus with unspecified diabetic retinopathy with macular edema: Secondary | ICD-10-CM

## 2017-08-08 DIAGNOSIS — I10 Essential (primary) hypertension: Secondary | ICD-10-CM | POA: Diagnosis not present

## 2017-08-08 DIAGNOSIS — H43813 Vitreous degeneration, bilateral: Secondary | ICD-10-CM | POA: Diagnosis not present

## 2017-08-08 DIAGNOSIS — H338 Other retinal detachments: Secondary | ICD-10-CM | POA: Diagnosis not present

## 2017-08-08 DIAGNOSIS — E103591 Type 1 diabetes mellitus with proliferative diabetic retinopathy without macular edema, right eye: Secondary | ICD-10-CM

## 2017-08-08 DIAGNOSIS — H2702 Aphakia, left eye: Secondary | ICD-10-CM

## 2017-08-08 DIAGNOSIS — E103512 Type 1 diabetes mellitus with proliferative diabetic retinopathy with macular edema, left eye: Secondary | ICD-10-CM

## 2017-08-13 DIAGNOSIS — F411 Generalized anxiety disorder: Secondary | ICD-10-CM | POA: Diagnosis not present

## 2017-08-22 DIAGNOSIS — F411 Generalized anxiety disorder: Secondary | ICD-10-CM | POA: Diagnosis not present

## 2017-08-26 DIAGNOSIS — E109 Type 1 diabetes mellitus without complications: Secondary | ICD-10-CM | POA: Diagnosis not present

## 2017-08-26 DIAGNOSIS — E7800 Pure hypercholesterolemia, unspecified: Secondary | ICD-10-CM | POA: Diagnosis not present

## 2017-08-26 DIAGNOSIS — E039 Hypothyroidism, unspecified: Secondary | ICD-10-CM | POA: Diagnosis not present

## 2017-09-03 DIAGNOSIS — G4733 Obstructive sleep apnea (adult) (pediatric): Secondary | ICD-10-CM | POA: Diagnosis not present

## 2017-09-03 DIAGNOSIS — R5383 Other fatigue: Secondary | ICD-10-CM | POA: Diagnosis not present

## 2017-09-05 DIAGNOSIS — F411 Generalized anxiety disorder: Secondary | ICD-10-CM | POA: Diagnosis not present

## 2017-09-09 ENCOUNTER — Encounter (INDEPENDENT_AMBULATORY_CARE_PROVIDER_SITE_OTHER): Payer: Medicare Other | Admitting: Ophthalmology

## 2017-09-10 DIAGNOSIS — M8589 Other specified disorders of bone density and structure, multiple sites: Secondary | ICD-10-CM | POA: Diagnosis not present

## 2017-09-10 DIAGNOSIS — E109 Type 1 diabetes mellitus without complications: Secondary | ICD-10-CM | POA: Diagnosis not present

## 2017-09-10 DIAGNOSIS — Z23 Encounter for immunization: Secondary | ICD-10-CM | POA: Diagnosis not present

## 2017-09-10 DIAGNOSIS — E039 Hypothyroidism, unspecified: Secondary | ICD-10-CM | POA: Diagnosis not present

## 2017-09-10 DIAGNOSIS — E7800 Pure hypercholesterolemia, unspecified: Secondary | ICD-10-CM | POA: Diagnosis not present

## 2017-09-10 DIAGNOSIS — Z9641 Presence of insulin pump (external) (internal): Secondary | ICD-10-CM | POA: Diagnosis not present

## 2017-10-03 DIAGNOSIS — R300 Dysuria: Secondary | ICD-10-CM | POA: Diagnosis not present

## 2017-10-25 DIAGNOSIS — R300 Dysuria: Secondary | ICD-10-CM | POA: Diagnosis not present

## 2017-10-28 DIAGNOSIS — F411 Generalized anxiety disorder: Secondary | ICD-10-CM | POA: Diagnosis not present

## 2017-11-07 ENCOUNTER — Encounter (INDEPENDENT_AMBULATORY_CARE_PROVIDER_SITE_OTHER): Payer: Medicare Other | Admitting: Ophthalmology

## 2017-11-07 DIAGNOSIS — H35033 Hypertensive retinopathy, bilateral: Secondary | ICD-10-CM | POA: Diagnosis not present

## 2017-11-07 DIAGNOSIS — E103512 Type 1 diabetes mellitus with proliferative diabetic retinopathy with macular edema, left eye: Secondary | ICD-10-CM

## 2017-11-07 DIAGNOSIS — E103591 Type 1 diabetes mellitus with proliferative diabetic retinopathy without macular edema, right eye: Secondary | ICD-10-CM

## 2017-11-07 DIAGNOSIS — H338 Other retinal detachments: Secondary | ICD-10-CM | POA: Diagnosis not present

## 2017-11-07 DIAGNOSIS — H43813 Vitreous degeneration, bilateral: Secondary | ICD-10-CM | POA: Diagnosis not present

## 2017-11-07 DIAGNOSIS — E10311 Type 1 diabetes mellitus with unspecified diabetic retinopathy with macular edema: Secondary | ICD-10-CM | POA: Diagnosis not present

## 2017-11-07 DIAGNOSIS — H35371 Puckering of macula, right eye: Secondary | ICD-10-CM

## 2017-11-07 DIAGNOSIS — I10 Essential (primary) hypertension: Secondary | ICD-10-CM | POA: Diagnosis not present

## 2017-11-10 DIAGNOSIS — S90111A Contusion of right great toe without damage to nail, initial encounter: Secondary | ICD-10-CM | POA: Diagnosis not present

## 2017-11-13 DIAGNOSIS — S92414A Nondisplaced fracture of proximal phalanx of right great toe, initial encounter for closed fracture: Secondary | ICD-10-CM | POA: Diagnosis not present

## 2017-11-13 DIAGNOSIS — M79674 Pain in right toe(s): Secondary | ICD-10-CM | POA: Diagnosis not present

## 2017-11-25 DIAGNOSIS — F411 Generalized anxiety disorder: Secondary | ICD-10-CM | POA: Diagnosis not present

## 2017-11-26 DIAGNOSIS — M79671 Pain in right foot: Secondary | ICD-10-CM

## 2017-11-26 DIAGNOSIS — S92414D Nondisplaced fracture of proximal phalanx of right great toe, subsequent encounter for fracture with routine healing: Secondary | ICD-10-CM | POA: Diagnosis not present

## 2017-11-26 HISTORY — DX: Pain in right foot: M79.671

## 2017-11-27 DIAGNOSIS — I739 Peripheral vascular disease, unspecified: Secondary | ICD-10-CM

## 2017-11-27 HISTORY — DX: Peripheral vascular disease, unspecified: I73.9

## 2017-12-09 DIAGNOSIS — I70213 Atherosclerosis of native arteries of extremities with intermittent claudication, bilateral legs: Secondary | ICD-10-CM | POA: Diagnosis not present

## 2017-12-12 DIAGNOSIS — E039 Hypothyroidism, unspecified: Secondary | ICD-10-CM | POA: Diagnosis not present

## 2017-12-12 DIAGNOSIS — E109 Type 1 diabetes mellitus without complications: Secondary | ICD-10-CM | POA: Diagnosis not present

## 2017-12-12 DIAGNOSIS — E7800 Pure hypercholesterolemia, unspecified: Secondary | ICD-10-CM | POA: Diagnosis not present

## 2017-12-17 DIAGNOSIS — M79671 Pain in right foot: Secondary | ICD-10-CM | POA: Diagnosis not present

## 2017-12-19 DIAGNOSIS — E039 Hypothyroidism, unspecified: Secondary | ICD-10-CM | POA: Diagnosis not present

## 2017-12-19 DIAGNOSIS — M8589 Other specified disorders of bone density and structure, multiple sites: Secondary | ICD-10-CM | POA: Diagnosis not present

## 2017-12-19 DIAGNOSIS — Z9641 Presence of insulin pump (external) (internal): Secondary | ICD-10-CM | POA: Diagnosis not present

## 2017-12-19 DIAGNOSIS — E109 Type 1 diabetes mellitus without complications: Secondary | ICD-10-CM | POA: Diagnosis not present

## 2017-12-19 DIAGNOSIS — E7800 Pure hypercholesterolemia, unspecified: Secondary | ICD-10-CM | POA: Diagnosis not present

## 2017-12-23 DIAGNOSIS — F411 Generalized anxiety disorder: Secondary | ICD-10-CM | POA: Diagnosis not present

## 2018-01-06 DIAGNOSIS — I872 Venous insufficiency (chronic) (peripheral): Secondary | ICD-10-CM | POA: Diagnosis not present

## 2018-01-06 DIAGNOSIS — I87323 Chronic venous hypertension (idiopathic) with inflammation of bilateral lower extremity: Secondary | ICD-10-CM | POA: Diagnosis not present

## 2018-01-06 DIAGNOSIS — I739 Peripheral vascular disease, unspecified: Secondary | ICD-10-CM | POA: Diagnosis not present

## 2018-01-20 DIAGNOSIS — H47212 Primary optic atrophy, left eye: Secondary | ICD-10-CM | POA: Diagnosis not present

## 2018-01-20 DIAGNOSIS — E103412 Type 1 diabetes mellitus with severe nonproliferative diabetic retinopathy with macular edema, left eye: Secondary | ICD-10-CM | POA: Diagnosis not present

## 2018-02-05 DIAGNOSIS — I872 Venous insufficiency (chronic) (peripheral): Secondary | ICD-10-CM

## 2018-02-05 DIAGNOSIS — I739 Peripheral vascular disease, unspecified: Secondary | ICD-10-CM | POA: Diagnosis not present

## 2018-02-05 HISTORY — DX: Venous insufficiency (chronic) (peripheral): I87.2

## 2018-02-27 ENCOUNTER — Encounter (INDEPENDENT_AMBULATORY_CARE_PROVIDER_SITE_OTHER): Payer: Medicare Other | Admitting: Ophthalmology

## 2018-02-27 DIAGNOSIS — H43813 Vitreous degeneration, bilateral: Secondary | ICD-10-CM

## 2018-02-27 DIAGNOSIS — E10311 Type 1 diabetes mellitus with unspecified diabetic retinopathy with macular edema: Secondary | ICD-10-CM

## 2018-02-27 DIAGNOSIS — E103591 Type 1 diabetes mellitus with proliferative diabetic retinopathy without macular edema, right eye: Secondary | ICD-10-CM | POA: Diagnosis not present

## 2018-02-27 DIAGNOSIS — H338 Other retinal detachments: Secondary | ICD-10-CM

## 2018-02-27 DIAGNOSIS — I10 Essential (primary) hypertension: Secondary | ICD-10-CM

## 2018-02-27 DIAGNOSIS — H2702 Aphakia, left eye: Secondary | ICD-10-CM

## 2018-02-27 DIAGNOSIS — H35033 Hypertensive retinopathy, bilateral: Secondary | ICD-10-CM

## 2018-02-27 DIAGNOSIS — E103512 Type 1 diabetes mellitus with proliferative diabetic retinopathy with macular edema, left eye: Secondary | ICD-10-CM | POA: Diagnosis not present

## 2018-03-12 ENCOUNTER — Encounter: Payer: Self-pay | Admitting: Adult Health

## 2018-03-12 ENCOUNTER — Inpatient Hospital Stay: Payer: Medicare Other | Attending: Adult Health | Admitting: Adult Health

## 2018-03-12 VITALS — BP 147/48 | HR 68 | Temp 97.8°F | Resp 18 | Ht 67.0 in | Wt 186.8 lb

## 2018-03-12 DIAGNOSIS — Z86000 Personal history of in-situ neoplasm of breast: Secondary | ICD-10-CM | POA: Insufficient documentation

## 2018-03-12 DIAGNOSIS — D0502 Lobular carcinoma in situ of left breast: Secondary | ICD-10-CM

## 2018-03-12 DIAGNOSIS — G4733 Obstructive sleep apnea (adult) (pediatric): Secondary | ICD-10-CM | POA: Diagnosis not present

## 2018-03-12 DIAGNOSIS — Z1239 Encounter for other screening for malignant neoplasm of breast: Secondary | ICD-10-CM

## 2018-03-12 DIAGNOSIS — J301 Allergic rhinitis due to pollen: Secondary | ICD-10-CM | POA: Diagnosis not present

## 2018-03-12 NOTE — Progress Notes (Signed)
CLINIC:  Survivorship   REASON FOR VISIT:  Routine follow-up for history of LCIS.   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  She underwent lumpectomy and Tamoxifen x 5 years.   INTERVAL HISTORY:  April Long presents to the [facility] today for routine follow-up for her history of LCIS.  Overall, she reports feeling quite well. She is doing well today other than burning her right forearm in the toaster oven yesterday.  Her breasts are without any questions or concerns today.     REVIEW OF SYSTEMS:  Review of Systems  Constitutional: Negative for appetite change, chills, fatigue, fever and unexpected weight change.  HENT:   Negative for hearing loss, lump/mass and trouble swallowing.   Eyes: Negative for eye problems and icterus.  Respiratory: Negative for chest tightness, cough and shortness of breath.   Cardiovascular: Negative for chest pain, leg swelling and palpitations.  Gastrointestinal: Negative for abdominal distention, abdominal pain, constipation, diarrhea, nausea and vomiting.  Endocrine: Negative for hot flashes.  Skin: Negative for itching and rash.  Neurological: Negative for dizziness, extremity weakness and headaches.  Hematological: Negative for adenopathy. Does not bruise/bleed easily.  Psychiatric/Behavioral: Negative for depression. The patient is not nervous/anxious.   Breast: Denies any new nodularity, masses, tenderness, nipple changes, or nipple discharge.       PAST MEDICAL/SURGICAL HISTORY:  Past Medical History:  Diagnosis Date  . 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 Cancer ([place])   . Diabetes mellitus    type 1, for almost 48 years  . 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 Osteoporosis    early stage  . Slurring of speech 08/03/2013  . Syncope 08/03/2013  . Thyroid disease    Past Surgical History:  Procedure Laterality Date  . BREAST  LUMPECTOMY Left   . BREAST SURGERY  2012   lumpectomy  . EYE SURGERY  1983   retinal reattachment  . TUBAL LIGATION  1974     ALLERGIES:  Allergies  Allergen Reactions  . Sulfa Drugs Cross Reactors Rash    On chest only     CURRENT MEDICATIONS:  Outpatient Encounter Medications as of 03/12/2018  Medication Sig Note  . atorvastatin (LIPITOR) 10 MG tablet Take 10 mg by mouth daily.   . busPIRone (BUSPAR) 15 MG tablet take 1 tablet by mouth every morning and 1 tablet IN THE AFTERNOON   . co-enzyme Q-10 30 MG capsule Take 100 mg by mouth daily.  02/18/2013: Taking once daily  . escitalopram (LEXAPRO) 20 MG tablet  02/17/2015: Received from: External Pharmacy  . Insulin Aspart (NOVOLOG ) Inject into the skin. Insulin pump    . levothyroxine (SYNTHROID) 50 MCG tablet Take 1 tablet by mouth as directed.   Marland Kitchen lisinopril (PRINIVIL,ZESTRIL) 10 MG tablet Take 10 mg by mouth daily.   . Multiple Vitamin (MULTIVITAMIN) tablet Take 2 tablets by mouth daily.    Marland Kitchen VITAMIN D, CHOLECALCIFEROL, PO Take 1,000 Units by mouth.     No facility-administered encounter medications on file as of 03/12/2018.      ONCOLOGIC FAMILY HISTORY:  Family History  Problem Relation Age of Onset  . Alzheimer's disease Mother   . Heart disease Father   . Heart failure Father   . Breast cancer Neg Hx       SOCIAL HISTORY:  Social History   Socioeconomic History  . Marital status: Married  Spouse name: Not on file  . Number of children: Not on file  . Years of education: Not on file  . Highest education level: Not on file  Occupational History  . Not on file  Social Needs  . Financial resource strain: Not on file  . Food insecurity:    Worry: Not on file    Inability: Not on file  . Transportation needs:    Medical: Not on file    Non-medical: Not on file  Tobacco Use  . Smoking status: Former Smoker    Last attempt to quit: 12/19/2000    Years since quitting: 17.2  . Smokeless tobacco: Never  Used  Substance and Sexual Activity  . Alcohol use: Yes    Alcohol/week: 4.2 oz    Types: 7 Glasses of wine per week    Comment: 1 -2 glasses of wine daily or weekly  . Drug use: No  . Sexual activity: Yes  Lifestyle  . Physical activity:    Days per week: Not on file    Minutes per session: Not on file  . Stress: Not on file  Relationships  . Social connections:    Talks on phone: Not on file    Gets together: Not on file    Attends religious service: Not on file    Active member of club or organization: Not on file    Attends meetings of clubs or organizations: Not on file    Relationship status: Not on file  . Intimate partner violence:    Fear of current or ex partner: Not on file    Emotionally abused: Not on file    Physically abused: Not on file    Forced sexual activity: Not on file  Other Topics Concern  . Not on file  Social History Narrative  . Not on file      PHYSICAL EXAMINATION:  Vital Signs: Vitals:   03/12/18 1128  BP: (!) 147/48  Pulse: 68  Resp: 18  Temp: 97.8 F (36.6 C)  SpO2: 95%   Filed Weights   03/12/18 1128  Weight: 186 lb 12.8 oz (84.7 kg)   General: Well-nourished, well-appearing female in no acute distress.  Accompanied by her husband today.   HEENT: Head is normocephalic.  Pupils equal and reactive to light. Conjunctivae clear without exudate.  Sclerae anicteric. Oral mucosa is pink, moist.  Oropharynx is pink without lesions or erythema.  Lymph: No cervical, supraclavicular, or infraclavicular lymphadenopathy noted on palpation.  Cardiovascular: Regular rate and rhythm.Marland Kitchen Respiratory: Clear to auscultation bilaterally. Chest expansion symmetric; breathing non-labored.  Breast Exam:  -Left breast: No appreciable masses on palpation. No skin redness, thickening, or peau d'orange appearance; no nipple retraction or nipple discharge; mild distortion in symmetry at previous lumpectomy site well healed scar without erythema or nodularity.   -Right breast: No appreciable masses on palpation. No skin redness, thickening, or peau d'orange appearance; no nipple retraction or nipple discharge; -Axilla: No axillary adenopathy bilaterally.  GI: Abdomen soft and round; non-tender, non-distended. Bowel sounds normoactive. No hepatosplenomegaly.   GU: Deferred.  Neuro: No focal deficits. Steady gait.  Psych: Mood and affect normal and appropriate for situation.  MSK: No focal spinal tenderness to palpation, full range of motion in bilateral upper extremities Extremities: No edema. Skin: Warm and dry.  LABORATORY DATA:  None for this visit   DIAGNOSTIC IMAGING:  Most recent mammogram:      ASSESSMENT AND PLAN:  Ms.. Long is a pleasant 71 y.o.  female with history of LCIS in 2012, treated with lumpectomy and 5 years of Tamoxifen.  She presents to the Survivorship Clinic for surveillance and routine follow-up.   1. History of breast cancer:  April Long is currently clinically and radiographically without evidence of disease or recurrence. She will be due for mammogram in 05/2018; orders placed today.   I talked to her that it has been greater than 5 years since her LCIS diagnosis, and yes she is at high risk, but can undergo screening and f/u with gynecology or her PCP.  She has opted to do this.  She will continue with annual mammogram and annual breast exams.  She knows we are available for any questions or concerns should they arise.    2. Cancer screening:  Due to April Long's history and her age, she should receive screening for skin cancers, colon cancer, and gynecologic cancers (per GYN). She was encouraged to follow-up with her PCP for appropriate cancer screenings.   3. Health maintenance and wellness promotion: April Long was encouraged to consume 5-7 servings of fruits and vegetables per day. She was also encouraged to engage in moderate to vigorous exercise for 30 minutes per day most days of the week. She was instructed  to limit her alcohol consumption and continue to abstain from tobacco use.     Dispo:  -Return to cancer center PRN   A total of (30) minutes of face-to-face time was spent with this patient with greater than 50% of that time in counseling and care-coordination.   Gardenia Phlegm, NP Survivorship Program [facility] [phone]   Note: PRIMARY CARE PROVIDER Ernestene Kiel, [place] [phone]

## 2018-03-13 DIAGNOSIS — E7800 Pure hypercholesterolemia, unspecified: Secondary | ICD-10-CM | POA: Diagnosis not present

## 2018-03-13 DIAGNOSIS — E039 Hypothyroidism, unspecified: Secondary | ICD-10-CM | POA: Diagnosis not present

## 2018-03-13 DIAGNOSIS — E109 Type 1 diabetes mellitus without complications: Secondary | ICD-10-CM | POA: Diagnosis not present

## 2018-03-13 DIAGNOSIS — M8589 Other specified disorders of bone density and structure, multiple sites: Secondary | ICD-10-CM | POA: Diagnosis not present

## 2018-03-20 DIAGNOSIS — E039 Hypothyroidism, unspecified: Secondary | ICD-10-CM | POA: Diagnosis not present

## 2018-03-20 DIAGNOSIS — E109 Type 1 diabetes mellitus without complications: Secondary | ICD-10-CM | POA: Diagnosis not present

## 2018-03-20 DIAGNOSIS — E7800 Pure hypercholesterolemia, unspecified: Secondary | ICD-10-CM | POA: Diagnosis not present

## 2018-03-20 DIAGNOSIS — Z9641 Presence of insulin pump (external) (internal): Secondary | ICD-10-CM | POA: Diagnosis not present

## 2018-03-20 DIAGNOSIS — M8589 Other specified disorders of bone density and structure, multiple sites: Secondary | ICD-10-CM | POA: Diagnosis not present

## 2018-04-16 DIAGNOSIS — E039 Hypothyroidism, unspecified: Secondary | ICD-10-CM | POA: Diagnosis not present

## 2018-04-16 DIAGNOSIS — E7800 Pure hypercholesterolemia, unspecified: Secondary | ICD-10-CM | POA: Diagnosis not present

## 2018-04-16 DIAGNOSIS — E109 Type 1 diabetes mellitus without complications: Secondary | ICD-10-CM | POA: Diagnosis not present

## 2018-05-01 DIAGNOSIS — F331 Major depressive disorder, recurrent, moderate: Secondary | ICD-10-CM | POA: Diagnosis not present

## 2018-05-01 DIAGNOSIS — F411 Generalized anxiety disorder: Secondary | ICD-10-CM | POA: Diagnosis not present

## 2018-05-07 DIAGNOSIS — F331 Major depressive disorder, recurrent, moderate: Secondary | ICD-10-CM | POA: Diagnosis not present

## 2018-05-07 DIAGNOSIS — F411 Generalized anxiety disorder: Secondary | ICD-10-CM | POA: Diagnosis not present

## 2018-05-09 DIAGNOSIS — E7800 Pure hypercholesterolemia, unspecified: Secondary | ICD-10-CM | POA: Diagnosis not present

## 2018-05-09 DIAGNOSIS — E109 Type 1 diabetes mellitus without complications: Secondary | ICD-10-CM | POA: Diagnosis not present

## 2018-05-20 DIAGNOSIS — F331 Major depressive disorder, recurrent, moderate: Secondary | ICD-10-CM | POA: Diagnosis not present

## 2018-06-02 DIAGNOSIS — F331 Major depressive disorder, recurrent, moderate: Secondary | ICD-10-CM | POA: Diagnosis not present

## 2018-06-02 DIAGNOSIS — F411 Generalized anxiety disorder: Secondary | ICD-10-CM | POA: Diagnosis not present

## 2018-06-10 DIAGNOSIS — R269 Unspecified abnormalities of gait and mobility: Secondary | ICD-10-CM

## 2018-06-10 HISTORY — DX: Unspecified abnormalities of gait and mobility: R26.9

## 2018-06-11 DIAGNOSIS — Z6828 Body mass index (BMI) 28.0-28.9, adult: Secondary | ICD-10-CM | POA: Diagnosis not present

## 2018-06-11 DIAGNOSIS — K219 Gastro-esophageal reflux disease without esophagitis: Secondary | ICD-10-CM | POA: Diagnosis not present

## 2018-06-11 DIAGNOSIS — R0602 Shortness of breath: Secondary | ICD-10-CM | POA: Diagnosis not present

## 2018-06-11 DIAGNOSIS — E663 Overweight: Secondary | ICD-10-CM | POA: Diagnosis not present

## 2018-06-12 DIAGNOSIS — R2681 Unsteadiness on feet: Secondary | ICD-10-CM | POA: Diagnosis not present

## 2018-06-12 DIAGNOSIS — R2689 Other abnormalities of gait and mobility: Secondary | ICD-10-CM | POA: Diagnosis not present

## 2018-06-16 DIAGNOSIS — E039 Hypothyroidism, unspecified: Secondary | ICD-10-CM | POA: Diagnosis not present

## 2018-06-16 DIAGNOSIS — E7800 Pure hypercholesterolemia, unspecified: Secondary | ICD-10-CM | POA: Diagnosis not present

## 2018-06-16 DIAGNOSIS — E108 Type 1 diabetes mellitus with unspecified complications: Secondary | ICD-10-CM | POA: Diagnosis not present

## 2018-06-17 DIAGNOSIS — F411 Generalized anxiety disorder: Secondary | ICD-10-CM | POA: Diagnosis not present

## 2018-06-17 DIAGNOSIS — R2681 Unsteadiness on feet: Secondary | ICD-10-CM | POA: Diagnosis not present

## 2018-06-17 DIAGNOSIS — R2689 Other abnormalities of gait and mobility: Secondary | ICD-10-CM | POA: Diagnosis not present

## 2018-06-17 DIAGNOSIS — F331 Major depressive disorder, recurrent, moderate: Secondary | ICD-10-CM | POA: Diagnosis not present

## 2018-06-18 ENCOUNTER — Ambulatory Visit
Admission: RE | Admit: 2018-06-18 | Discharge: 2018-06-18 | Disposition: A | Discharge status: Home / Self Care | Payer: Medicare Other | Source: Ambulatory Visit | Attending: Adult Health | Admitting: Adult Health

## 2018-06-18 DIAGNOSIS — Z1231 Encounter for screening mammogram for malignant neoplasm of breast: Secondary | ICD-10-CM | POA: Diagnosis not present

## 2018-06-18 DIAGNOSIS — Z1239 Encounter for other screening for malignant neoplasm of breast: Secondary | ICD-10-CM

## 2018-06-19 DIAGNOSIS — Z9641 Presence of insulin pump (external) (internal): Secondary | ICD-10-CM | POA: Diagnosis not present

## 2018-06-19 DIAGNOSIS — F419 Anxiety disorder, unspecified: Secondary | ICD-10-CM | POA: Diagnosis not present

## 2018-06-19 DIAGNOSIS — G4733 Obstructive sleep apnea (adult) (pediatric): Secondary | ICD-10-CM | POA: Diagnosis not present

## 2018-06-19 DIAGNOSIS — E109 Type 1 diabetes mellitus without complications: Secondary | ICD-10-CM | POA: Diagnosis not present

## 2018-06-19 DIAGNOSIS — E7800 Pure hypercholesterolemia, unspecified: Secondary | ICD-10-CM | POA: Diagnosis not present

## 2018-06-19 DIAGNOSIS — E039 Hypothyroidism, unspecified: Secondary | ICD-10-CM | POA: Diagnosis not present

## 2018-06-19 DIAGNOSIS — M8589 Other specified disorders of bone density and structure, multiple sites: Secondary | ICD-10-CM | POA: Diagnosis not present

## 2018-06-20 DIAGNOSIS — F331 Major depressive disorder, recurrent, moderate: Secondary | ICD-10-CM | POA: Diagnosis not present

## 2018-06-20 DIAGNOSIS — F411 Generalized anxiety disorder: Secondary | ICD-10-CM | POA: Diagnosis not present

## 2018-06-25 DIAGNOSIS — R2689 Other abnormalities of gait and mobility: Secondary | ICD-10-CM | POA: Diagnosis not present

## 2018-06-25 DIAGNOSIS — R2681 Unsteadiness on feet: Secondary | ICD-10-CM | POA: Diagnosis not present

## 2018-07-07 DIAGNOSIS — R2689 Other abnormalities of gait and mobility: Secondary | ICD-10-CM | POA: Diagnosis not present

## 2018-07-07 DIAGNOSIS — R2681 Unsteadiness on feet: Secondary | ICD-10-CM | POA: Diagnosis not present

## 2018-07-09 DIAGNOSIS — R2689 Other abnormalities of gait and mobility: Secondary | ICD-10-CM | POA: Diagnosis not present

## 2018-07-09 DIAGNOSIS — R2681 Unsteadiness on feet: Secondary | ICD-10-CM | POA: Diagnosis not present

## 2018-07-14 DIAGNOSIS — R2689 Other abnormalities of gait and mobility: Secondary | ICD-10-CM | POA: Diagnosis not present

## 2018-07-14 DIAGNOSIS — R2681 Unsteadiness on feet: Secondary | ICD-10-CM | POA: Diagnosis not present

## 2018-07-17 ENCOUNTER — Encounter (INDEPENDENT_AMBULATORY_CARE_PROVIDER_SITE_OTHER): Payer: Medicare Other | Admitting: Ophthalmology

## 2018-07-17 DIAGNOSIS — I10 Essential (primary) hypertension: Secondary | ICD-10-CM | POA: Diagnosis not present

## 2018-07-17 DIAGNOSIS — E103593 Type 1 diabetes mellitus with proliferative diabetic retinopathy without macular edema, bilateral: Secondary | ICD-10-CM

## 2018-07-17 DIAGNOSIS — E103533 Type 1 diabetes mellitus with proliferative diabetic retinopathy with traction retinal detachment not involving the macula, bilateral: Secondary | ICD-10-CM | POA: Diagnosis not present

## 2018-07-17 DIAGNOSIS — H338 Other retinal detachments: Secondary | ICD-10-CM

## 2018-07-17 DIAGNOSIS — H43813 Vitreous degeneration, bilateral: Secondary | ICD-10-CM | POA: Diagnosis not present

## 2018-07-17 DIAGNOSIS — E10319 Type 1 diabetes mellitus with unspecified diabetic retinopathy without macular edema: Secondary | ICD-10-CM

## 2018-07-17 DIAGNOSIS — H2702 Aphakia, left eye: Secondary | ICD-10-CM | POA: Diagnosis not present

## 2018-07-17 DIAGNOSIS — H35033 Hypertensive retinopathy, bilateral: Secondary | ICD-10-CM | POA: Diagnosis not present

## 2018-07-28 DIAGNOSIS — R269 Unspecified abnormalities of gait and mobility: Secondary | ICD-10-CM | POA: Diagnosis not present

## 2018-07-29 DIAGNOSIS — R2681 Unsteadiness on feet: Secondary | ICD-10-CM | POA: Diagnosis not present

## 2018-07-29 DIAGNOSIS — R2689 Other abnormalities of gait and mobility: Secondary | ICD-10-CM | POA: Diagnosis not present

## 2018-08-18 DIAGNOSIS — Z23 Encounter for immunization: Secondary | ICD-10-CM | POA: Diagnosis not present

## 2018-08-21 DIAGNOSIS — Z124 Encounter for screening for malignant neoplasm of cervix: Secondary | ICD-10-CM | POA: Diagnosis not present

## 2018-08-21 DIAGNOSIS — M8588 Other specified disorders of bone density and structure, other site: Secondary | ICD-10-CM | POA: Diagnosis not present

## 2018-08-21 DIAGNOSIS — Z6827 Body mass index (BMI) 27.0-27.9, adult: Secondary | ICD-10-CM | POA: Diagnosis not present

## 2018-08-21 DIAGNOSIS — N958 Other specified menopausal and perimenopausal disorders: Secondary | ICD-10-CM | POA: Diagnosis not present

## 2018-09-02 DIAGNOSIS — R12 Heartburn: Secondary | ICD-10-CM | POA: Diagnosis not present

## 2018-09-02 DIAGNOSIS — G4733 Obstructive sleep apnea (adult) (pediatric): Secondary | ICD-10-CM | POA: Diagnosis not present

## 2018-09-02 DIAGNOSIS — F331 Major depressive disorder, recurrent, moderate: Secondary | ICD-10-CM | POA: Diagnosis not present

## 2018-09-02 DIAGNOSIS — J301 Allergic rhinitis due to pollen: Secondary | ICD-10-CM | POA: Diagnosis not present

## 2018-09-02 DIAGNOSIS — F411 Generalized anxiety disorder: Secondary | ICD-10-CM | POA: Diagnosis not present

## 2018-09-14 DIAGNOSIS — J029 Acute pharyngitis, unspecified: Secondary | ICD-10-CM | POA: Diagnosis not present

## 2018-09-16 DIAGNOSIS — E109 Type 1 diabetes mellitus without complications: Secondary | ICD-10-CM | POA: Diagnosis not present

## 2018-09-16 DIAGNOSIS — E039 Hypothyroidism, unspecified: Secondary | ICD-10-CM | POA: Diagnosis not present

## 2018-09-16 DIAGNOSIS — E7800 Pure hypercholesterolemia, unspecified: Secondary | ICD-10-CM | POA: Diagnosis not present

## 2018-09-29 DIAGNOSIS — E039 Hypothyroidism, unspecified: Secondary | ICD-10-CM | POA: Diagnosis not present

## 2018-09-29 DIAGNOSIS — E7800 Pure hypercholesterolemia, unspecified: Secondary | ICD-10-CM | POA: Diagnosis not present

## 2018-09-29 DIAGNOSIS — I739 Peripheral vascular disease, unspecified: Secondary | ICD-10-CM | POA: Diagnosis not present

## 2018-09-29 DIAGNOSIS — I872 Venous insufficiency (chronic) (peripheral): Secondary | ICD-10-CM | POA: Diagnosis not present

## 2018-09-29 DIAGNOSIS — H3521 Other non-diabetic proliferative retinopathy, right eye: Secondary | ICD-10-CM | POA: Diagnosis not present

## 2018-09-29 DIAGNOSIS — Z9641 Presence of insulin pump (external) (internal): Secondary | ICD-10-CM | POA: Diagnosis not present

## 2018-09-29 DIAGNOSIS — E109 Type 1 diabetes mellitus without complications: Secondary | ICD-10-CM | POA: Diagnosis not present

## 2018-09-29 DIAGNOSIS — M8589 Other specified disorders of bone density and structure, multiple sites: Secondary | ICD-10-CM | POA: Diagnosis not present

## 2018-10-02 DIAGNOSIS — N3942 Incontinence without sensory awareness: Secondary | ICD-10-CM | POA: Diagnosis not present

## 2018-10-02 DIAGNOSIS — R350 Frequency of micturition: Secondary | ICD-10-CM | POA: Diagnosis not present

## 2018-10-02 DIAGNOSIS — R351 Nocturia: Secondary | ICD-10-CM | POA: Diagnosis not present

## 2018-10-02 DIAGNOSIS — N3944 Nocturnal enuresis: Secondary | ICD-10-CM | POA: Diagnosis not present

## 2018-10-02 DIAGNOSIS — N3946 Mixed incontinence: Secondary | ICD-10-CM | POA: Diagnosis not present

## 2018-10-21 DIAGNOSIS — F411 Generalized anxiety disorder: Secondary | ICD-10-CM | POA: Diagnosis not present

## 2018-10-21 DIAGNOSIS — F330 Major depressive disorder, recurrent, mild: Secondary | ICD-10-CM | POA: Diagnosis not present

## 2018-11-04 DIAGNOSIS — F330 Major depressive disorder, recurrent, mild: Secondary | ICD-10-CM | POA: Diagnosis not present

## 2018-11-06 ENCOUNTER — Encounter (INDEPENDENT_AMBULATORY_CARE_PROVIDER_SITE_OTHER): Payer: Medicare Other | Admitting: Ophthalmology

## 2018-11-06 DIAGNOSIS — E103593 Type 1 diabetes mellitus with proliferative diabetic retinopathy without macular edema, bilateral: Secondary | ICD-10-CM

## 2018-11-06 DIAGNOSIS — I10 Essential (primary) hypertension: Secondary | ICD-10-CM | POA: Diagnosis not present

## 2018-11-06 DIAGNOSIS — H338 Other retinal detachments: Secondary | ICD-10-CM

## 2018-11-06 DIAGNOSIS — H35033 Hypertensive retinopathy, bilateral: Secondary | ICD-10-CM

## 2018-11-06 DIAGNOSIS — E10319 Type 1 diabetes mellitus with unspecified diabetic retinopathy without macular edema: Secondary | ICD-10-CM | POA: Diagnosis not present

## 2018-11-06 DIAGNOSIS — E103533 Type 1 diabetes mellitus with proliferative diabetic retinopathy with traction retinal detachment not involving the macula, bilateral: Secondary | ICD-10-CM | POA: Diagnosis not present

## 2018-11-06 DIAGNOSIS — H43813 Vitreous degeneration, bilateral: Secondary | ICD-10-CM

## 2018-12-09 DIAGNOSIS — N3942 Incontinence without sensory awareness: Secondary | ICD-10-CM | POA: Diagnosis not present

## 2018-12-09 DIAGNOSIS — N3946 Mixed incontinence: Secondary | ICD-10-CM | POA: Diagnosis not present

## 2018-12-24 DIAGNOSIS — E7800 Pure hypercholesterolemia, unspecified: Secondary | ICD-10-CM | POA: Diagnosis not present

## 2018-12-24 DIAGNOSIS — E109 Type 1 diabetes mellitus without complications: Secondary | ICD-10-CM | POA: Diagnosis not present

## 2018-12-24 DIAGNOSIS — E039 Hypothyroidism, unspecified: Secondary | ICD-10-CM | POA: Diagnosis not present

## 2018-12-29 DIAGNOSIS — E1022 Type 1 diabetes mellitus with diabetic chronic kidney disease: Secondary | ICD-10-CM | POA: Diagnosis not present

## 2018-12-29 DIAGNOSIS — N183 Chronic kidney disease, stage 3 (moderate): Secondary | ICD-10-CM | POA: Diagnosis not present

## 2018-12-29 DIAGNOSIS — E109 Type 1 diabetes mellitus without complications: Secondary | ICD-10-CM | POA: Diagnosis not present

## 2018-12-29 DIAGNOSIS — I739 Peripheral vascular disease, unspecified: Secondary | ICD-10-CM | POA: Diagnosis not present

## 2018-12-29 DIAGNOSIS — Z87891 Personal history of nicotine dependence: Secondary | ICD-10-CM | POA: Diagnosis not present

## 2018-12-29 DIAGNOSIS — E039 Hypothyroidism, unspecified: Secondary | ICD-10-CM | POA: Diagnosis not present

## 2018-12-29 DIAGNOSIS — M8580 Other specified disorders of bone density and structure, unspecified site: Secondary | ICD-10-CM | POA: Diagnosis not present

## 2018-12-29 DIAGNOSIS — I872 Venous insufficiency (chronic) (peripheral): Secondary | ICD-10-CM | POA: Diagnosis not present

## 2018-12-29 DIAGNOSIS — H3521 Other non-diabetic proliferative retinopathy, right eye: Secondary | ICD-10-CM | POA: Diagnosis not present

## 2018-12-29 DIAGNOSIS — E7800 Pure hypercholesterolemia, unspecified: Secondary | ICD-10-CM | POA: Diagnosis not present

## 2018-12-29 DIAGNOSIS — Z9641 Presence of insulin pump (external) (internal): Secondary | ICD-10-CM | POA: Diagnosis not present

## 2018-12-31 DIAGNOSIS — N1831 Chronic kidney disease, stage 3a: Secondary | ICD-10-CM

## 2018-12-31 HISTORY — DX: Chronic kidney disease, stage 3a: N18.31

## 2019-01-13 DIAGNOSIS — S42201A Unspecified fracture of upper end of right humerus, initial encounter for closed fracture: Secondary | ICD-10-CM | POA: Diagnosis not present

## 2019-01-13 DIAGNOSIS — S42211A Unspecified displaced fracture of surgical neck of right humerus, initial encounter for closed fracture: Secondary | ICD-10-CM | POA: Diagnosis not present

## 2019-01-23 DIAGNOSIS — S42211A Unspecified displaced fracture of surgical neck of right humerus, initial encounter for closed fracture: Secondary | ICD-10-CM | POA: Diagnosis not present

## 2019-02-02 DIAGNOSIS — S42211D Unspecified displaced fracture of surgical neck of right humerus, subsequent encounter for fracture with routine healing: Secondary | ICD-10-CM | POA: Diagnosis not present

## 2019-02-23 DIAGNOSIS — S42211D Unspecified displaced fracture of surgical neck of right humerus, subsequent encounter for fracture with routine healing: Secondary | ICD-10-CM | POA: Diagnosis not present

## 2019-03-03 DIAGNOSIS — E103412 Type 1 diabetes mellitus with severe nonproliferative diabetic retinopathy with macular edema, left eye: Secondary | ICD-10-CM | POA: Diagnosis not present

## 2019-03-03 DIAGNOSIS — H47212 Primary optic atrophy, left eye: Secondary | ICD-10-CM | POA: Diagnosis not present

## 2019-03-03 DIAGNOSIS — H5201 Hypermetropia, right eye: Secondary | ICD-10-CM | POA: Diagnosis not present

## 2019-03-05 ENCOUNTER — Encounter (INDEPENDENT_AMBULATORY_CARE_PROVIDER_SITE_OTHER): Payer: Medicare Other | Admitting: Ophthalmology

## 2019-03-05 ENCOUNTER — Other Ambulatory Visit: Payer: Self-pay

## 2019-03-05 DIAGNOSIS — E103512 Type 1 diabetes mellitus with proliferative diabetic retinopathy with macular edema, left eye: Secondary | ICD-10-CM | POA: Diagnosis not present

## 2019-03-05 DIAGNOSIS — H338 Other retinal detachments: Secondary | ICD-10-CM

## 2019-03-05 DIAGNOSIS — E103591 Type 1 diabetes mellitus with proliferative diabetic retinopathy without macular edema, right eye: Secondary | ICD-10-CM | POA: Diagnosis not present

## 2019-03-05 DIAGNOSIS — E10311 Type 1 diabetes mellitus with unspecified diabetic retinopathy with macular edema: Secondary | ICD-10-CM

## 2019-03-05 DIAGNOSIS — H35033 Hypertensive retinopathy, bilateral: Secondary | ICD-10-CM | POA: Diagnosis not present

## 2019-03-05 DIAGNOSIS — H43813 Vitreous degeneration, bilateral: Secondary | ICD-10-CM

## 2019-03-05 DIAGNOSIS — I10 Essential (primary) hypertension: Secondary | ICD-10-CM | POA: Diagnosis not present

## 2019-03-18 DIAGNOSIS — S42211D Unspecified displaced fracture of surgical neck of right humerus, subsequent encounter for fracture with routine healing: Secondary | ICD-10-CM | POA: Diagnosis not present

## 2019-03-18 DIAGNOSIS — M7501 Adhesive capsulitis of right shoulder: Secondary | ICD-10-CM | POA: Diagnosis not present

## 2019-03-20 DIAGNOSIS — N3090 Cystitis, unspecified without hematuria: Secondary | ICD-10-CM | POA: Diagnosis not present

## 2019-03-20 DIAGNOSIS — N3000 Acute cystitis without hematuria: Secondary | ICD-10-CM | POA: Diagnosis not present

## 2019-03-23 DIAGNOSIS — E109 Type 1 diabetes mellitus without complications: Secondary | ICD-10-CM | POA: Diagnosis not present

## 2019-03-23 DIAGNOSIS — E039 Hypothyroidism, unspecified: Secondary | ICD-10-CM | POA: Diagnosis not present

## 2019-03-23 DIAGNOSIS — M25611 Stiffness of right shoulder, not elsewhere classified: Secondary | ICD-10-CM | POA: Diagnosis not present

## 2019-03-23 DIAGNOSIS — E7800 Pure hypercholesterolemia, unspecified: Secondary | ICD-10-CM | POA: Diagnosis not present

## 2019-03-31 DIAGNOSIS — J301 Allergic rhinitis due to pollen: Secondary | ICD-10-CM | POA: Diagnosis not present

## 2019-03-31 DIAGNOSIS — I739 Peripheral vascular disease, unspecified: Secondary | ICD-10-CM | POA: Diagnosis not present

## 2019-03-31 DIAGNOSIS — R12 Heartburn: Secondary | ICD-10-CM | POA: Diagnosis not present

## 2019-03-31 DIAGNOSIS — Z9641 Presence of insulin pump (external) (internal): Secondary | ICD-10-CM | POA: Diagnosis not present

## 2019-03-31 DIAGNOSIS — E1022 Type 1 diabetes mellitus with diabetic chronic kidney disease: Secondary | ICD-10-CM | POA: Diagnosis not present

## 2019-03-31 DIAGNOSIS — H3521 Other non-diabetic proliferative retinopathy, right eye: Secondary | ICD-10-CM | POA: Diagnosis not present

## 2019-03-31 DIAGNOSIS — N183 Chronic kidney disease, stage 3 (moderate): Secondary | ICD-10-CM | POA: Diagnosis not present

## 2019-03-31 DIAGNOSIS — M8589 Other specified disorders of bone density and structure, multiple sites: Secondary | ICD-10-CM | POA: Diagnosis not present

## 2019-03-31 DIAGNOSIS — R634 Abnormal weight loss: Secondary | ICD-10-CM | POA: Diagnosis not present

## 2019-03-31 DIAGNOSIS — E039 Hypothyroidism, unspecified: Secondary | ICD-10-CM | POA: Diagnosis not present

## 2019-03-31 DIAGNOSIS — G4733 Obstructive sleep apnea (adult) (pediatric): Secondary | ICD-10-CM | POA: Diagnosis not present

## 2019-03-31 DIAGNOSIS — E7800 Pure hypercholesterolemia, unspecified: Secondary | ICD-10-CM | POA: Diagnosis not present

## 2019-03-31 DIAGNOSIS — I872 Venous insufficiency (chronic) (peripheral): Secondary | ICD-10-CM | POA: Diagnosis not present

## 2019-04-02 DIAGNOSIS — Z9641 Presence of insulin pump (external) (internal): Secondary | ICD-10-CM | POA: Diagnosis not present

## 2019-04-02 DIAGNOSIS — Z87440 Personal history of urinary (tract) infections: Secondary | ICD-10-CM | POA: Diagnosis not present

## 2019-04-02 DIAGNOSIS — R5381 Other malaise: Secondary | ICD-10-CM | POA: Diagnosis not present

## 2019-04-02 DIAGNOSIS — E663 Overweight: Secondary | ICD-10-CM | POA: Diagnosis not present

## 2019-04-02 DIAGNOSIS — Z6825 Body mass index (BMI) 25.0-25.9, adult: Secondary | ICD-10-CM | POA: Diagnosis not present

## 2019-04-02 DIAGNOSIS — E108 Type 1 diabetes mellitus with unspecified complications: Secondary | ICD-10-CM | POA: Diagnosis not present

## 2019-04-21 DIAGNOSIS — F411 Generalized anxiety disorder: Secondary | ICD-10-CM | POA: Diagnosis not present

## 2019-04-21 DIAGNOSIS — F330 Major depressive disorder, recurrent, mild: Secondary | ICD-10-CM | POA: Diagnosis not present

## 2019-04-22 DIAGNOSIS — N3946 Mixed incontinence: Secondary | ICD-10-CM | POA: Diagnosis not present

## 2019-04-29 DIAGNOSIS — I872 Venous insufficiency (chronic) (peripheral): Secondary | ICD-10-CM | POA: Diagnosis not present

## 2019-04-29 DIAGNOSIS — E109 Type 1 diabetes mellitus without complications: Secondary | ICD-10-CM | POA: Diagnosis not present

## 2019-04-29 DIAGNOSIS — I739 Peripheral vascular disease, unspecified: Secondary | ICD-10-CM | POA: Diagnosis not present

## 2019-05-12 DIAGNOSIS — M25511 Pain in right shoulder: Secondary | ICD-10-CM | POA: Diagnosis not present

## 2019-05-12 DIAGNOSIS — I10 Essential (primary) hypertension: Secondary | ICD-10-CM | POA: Diagnosis not present

## 2019-05-12 DIAGNOSIS — M25611 Stiffness of right shoulder, not elsewhere classified: Secondary | ICD-10-CM | POA: Diagnosis not present

## 2019-05-18 DIAGNOSIS — E109 Type 1 diabetes mellitus without complications: Secondary | ICD-10-CM | POA: Diagnosis not present

## 2019-05-26 DIAGNOSIS — E1065 Type 1 diabetes mellitus with hyperglycemia: Secondary | ICD-10-CM | POA: Diagnosis not present

## 2019-06-08 ENCOUNTER — Other Ambulatory Visit: Payer: Self-pay | Admitting: Adult Health

## 2019-06-08 DIAGNOSIS — Z1231 Encounter for screening mammogram for malignant neoplasm of breast: Secondary | ICD-10-CM

## 2019-06-10 ENCOUNTER — Other Ambulatory Visit: Payer: Self-pay

## 2019-06-10 ENCOUNTER — Encounter: Payer: Self-pay | Admitting: Allergy and Immunology

## 2019-06-10 ENCOUNTER — Ambulatory Visit (INDEPENDENT_AMBULATORY_CARE_PROVIDER_SITE_OTHER): Payer: Medicare Other | Admitting: Allergy and Immunology

## 2019-06-10 VITALS — BP 116/42 | HR 68 | Temp 97.6°F | Resp 16 | Ht 66.5 in | Wt 163.8 lb

## 2019-06-10 DIAGNOSIS — L299 Pruritus, unspecified: Secondary | ICD-10-CM

## 2019-06-10 NOTE — Progress Notes (Signed)
[place] [facility] [place] - [place] - [place]   Dear Dr. Rayne Du ref. provider found,  Thank you for referring Tawanna Sat to the [facility] on 06/10/2019.   Below is a summation of this patient's evaluation and recommendations.  Thank you for your referral. I will keep you informed about this patient's response to treatment.   If you have any questions please do not hesitate to contact me.   Sincerely,  Jiles Prows, MD Allergy / Immunology [place]   ______________________________________________________________________    NEW PATIENT NOTE  Referring Provider: No ref. provider found Primary Provider: Ernestene Kiel, MD Date of office visit: 06/10/2019    Subjective:   Chief Complaint:  April Long (DOB: 02/02/1946) is a 73 y.o. female who presents to the clinic on 06/10/2019 with a chief complaint of Urticaria, Rash, and Pruritus .     HPI: Matasha presents to this clinic in evaluation of a urticarial reaction.  Apparently 3 days ago, during late morning, she developed intense right arm itching and scratched her arm.  This lasted approximately 30 minutes and during that timeframe she ended up producing a bleed under her skin.  After 30 minutes her pruritus resolved and she has not had any skin problems since that point in time.  There was no associated systemic or constitutional symptoms and there was no obvious provoking factor giving rise to this issue.  She did not use any medications to address this issue.  She does not really have a history of atopic disease other than very distant history of allergic rhinitis that would show up in the spring.  Past Medical History:  Diagnosis Date  . 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 Cancer ([place])   . Diabetes mellitus    type 1, for almost 48 years  . Lobular  carcinoma in situ 11/29/2011  . Osteoporosis    early stage  . Slurring of speech 08/03/2013  . Syncope 08/03/2013  . Thyroid disease     Past Surgical History:  Procedure Laterality Date  . BREAST LUMPECTOMY Left   . BREAST SURGERY  2012   lumpectomy  . EYE SURGERY  1983   retinal reattachment  . TUBAL LIGATION  1974    Allergies as of 06/10/2019      Reactions   Sulfa Drugs Cross Reactors Rash   On chest only      Medication List      atorvastatin 10 MG tablet Commonly known as: LIPITOR Take 10 mg by mouth daily.   buPROPion 300 MG 24 hr tablet Commonly known as: WELLBUTRIN XL Take 1 tablet once a day for anxiety and mood   busPIRone 15 MG tablet Commonly known as: BUSPAR take 1 tablet by mouth every morning and 1 tablet IN THE AFTERNOON   co-enzyme Q-10 30 MG capsule Take 100 mg by mouth daily.   escitalopram 20 MG tablet Commonly known as: LEXAPRO   lisinopril 10 MG tablet Commonly known as: ZESTRIL Take 10 mg by mouth daily.   multivitamin tablet Take 2 tablets by mouth daily.   NOVOLOG [place] Inject into the skin. Insulin pump   Synthroid 50 MCG tablet Generic drug: levothyroxine Take 1 tablet by mouth as directed.   VITAMIN D (CHOLECALCIFEROL) PO Take 1,000 Units by mouth.   ZOLPIDEM TARTRATE PO Take by mouth.  Review of systems negative except as noted in HPI / PMHx or noted below:  Review of Systems  Constitutional: Negative.   HENT: Negative.   Eyes: Negative.   Respiratory: Negative.   Cardiovascular: Negative.   Gastrointestinal: Negative.   Genitourinary: Negative.   Musculoskeletal: Negative.   Skin: Negative.   Neurological: Negative.   Endo/Heme/Allergies: Negative.   Psychiatric/Behavioral: Negative.     Family History  Problem Relation Age of Onset  . Alzheimer's disease Mother   . Heart disease Father   . Heart failure Father   . Breast cancer Neg Hx     Social History   Socioeconomic History  . Marital  status: Married    Spouse name: Not on file  . Number of children: Not on file  . Years of education: Not on file  . Highest education level: Not on file  Occupational History  . Not on file  Social Needs  . Financial resource strain: Not on file  . Food insecurity    Worry: Not on file    Inability: Not on file  . Transportation needs    Medical: Not on file    Non-medical: Not on file  Tobacco Use  . Smoking status: Former Smoker    Quit date: 12/19/2000    Years since quitting: 18.4  . Smokeless tobacco: Never Used  Substance and Sexual Activity  . Alcohol use: Yes    Alcohol/week: 7.0 standard drinks    Types: 7 Glasses of wine per week    Comment: 1 -2 glasses of wine daily or weekly  . Drug use: No  . Sexual activity: Yes  Lifestyle  . Physical activity    Days per week: Not on file    Minutes per session: Not on file  . Stress: Not on file  Relationships  . Social Herbalist on phone: Not on file    Gets together: Not on file    Attends religious service: Not on file    Active member of club or organization: Not on file    Attends meetings of clubs or organizations: Not on file    Relationship status: Not on file  . Intimate partner violence    Fear of current or ex partner: Not on file    Emotionally abused: Not on file    Physically abused: Not on file    Forced sexual activity: Not on file  Other Topics Concern  . Not on file  Social History Narrative  . Not on file    Environmental and Social history  Lives in a house with a dry environment, a dog located inside the household, hardwood in the bedroom, plastic on the bed, no plastic on the pillow, and no smoking ongoing with inside the household.  Objective:   Vitals:   06/10/19 0935  BP: (!) 116/42  Pulse: 68  Resp: 16  Temp: 97.6 F (36.4 C)  SpO2: 95%   Height: 5' 6.5" (168.9 cm) Weight: 163 lb 12.8 oz (74.3 kg)  Physical Exam Constitutional:      Appearance: She is not  diaphoretic.  HENT:     Head: Normocephalic. No right periorbital erythema or left periorbital erythema.     Right Ear: Tympanic membrane, ear canal and external ear normal.     Left Ear: Tympanic membrane, ear canal and external ear normal.     Nose: Nose normal. No mucosal edema or rhinorrhea.     Mouth/Throat:  Pharynx: No oropharyngeal exudate.  Eyes:     General: Lids are normal.     Conjunctiva/sclera: Conjunctivae normal.     Pupils: Pupils are equal, round, and reactive to light.  Neck:     Thyroid: No thyromegaly.     Trachea: Trachea normal. No tracheal deviation.  Cardiovascular:     Rate and Rhythm: Normal rate and regular rhythm.     Heart sounds: Normal heart sounds, S1 normal and S2 normal. No murmur.  Pulmonary:     Effort: Pulmonary effort is normal. No respiratory distress.     Breath sounds: No stridor. No wheezing or rales.  Chest:     Chest wall: No tenderness.  Abdominal:     General: There is no distension.     Palpations: Abdomen is soft. There is no mass.     Tenderness: There is no abdominal tenderness. There is no guarding or rebound.  Musculoskeletal:        General: No tenderness.  Lymphadenopathy:     Head:     Right side of head: No tonsillar adenopathy.     Left side of head: No tonsillar adenopathy.     Cervical: No cervical adenopathy.  Skin:    Coloration: Skin is not pale.     Findings: Rash (Linear ecchymotic area right forearm) present. No erythema.     Nails: There is no clubbing.   Neurological:     Mental Status: She is alert.     Diagnostics: Allergy skin tests were not performed.   Assessment and Plan:    1. Pruritic disorder     Teela appears to have had some form of immunological hyperreactivity that was relatively transient giving rise to her pruritus involving her right arm.  This process appears to be inactive at this point in time and I will not have her undergo any further evaluation for the etiologic factor  responsible for this issue unless of course this issue becomes recurrent.  I have asked her to contact me if she does develop recurrent problems in the future.  Jiles Prows, MD Allergy / Immunology [facility]

## 2019-06-11 ENCOUNTER — Ambulatory Visit: Payer: Medicare Other | Admitting: Allergy and Immunology

## 2019-06-11 ENCOUNTER — Encounter: Payer: Self-pay | Admitting: Allergy and Immunology

## 2019-06-15 ENCOUNTER — Ambulatory Visit: Admitting: Allergy and Immunology

## 2019-06-18 ENCOUNTER — Encounter (INDEPENDENT_AMBULATORY_CARE_PROVIDER_SITE_OTHER): Payer: Medicare Other | Admitting: Ophthalmology

## 2019-06-18 ENCOUNTER — Other Ambulatory Visit: Payer: Self-pay

## 2019-06-18 DIAGNOSIS — I10 Essential (primary) hypertension: Secondary | ICD-10-CM

## 2019-06-18 DIAGNOSIS — E113593 Type 2 diabetes mellitus with proliferative diabetic retinopathy without macular edema, bilateral: Secondary | ICD-10-CM

## 2019-06-18 DIAGNOSIS — E11319 Type 2 diabetes mellitus with unspecified diabetic retinopathy without macular edema: Secondary | ICD-10-CM

## 2019-06-18 DIAGNOSIS — H43813 Vitreous degeneration, bilateral: Secondary | ICD-10-CM

## 2019-06-18 DIAGNOSIS — H35033 Hypertensive retinopathy, bilateral: Secondary | ICD-10-CM | POA: Diagnosis not present

## 2019-06-18 DIAGNOSIS — H338 Other retinal detachments: Secondary | ICD-10-CM | POA: Diagnosis not present

## 2019-06-24 DIAGNOSIS — R202 Paresthesia of skin: Secondary | ICD-10-CM

## 2019-06-24 HISTORY — DX: Paresthesia of skin: R20.2

## 2019-07-01 DIAGNOSIS — E7800 Pure hypercholesterolemia, unspecified: Secondary | ICD-10-CM | POA: Diagnosis not present

## 2019-07-01 DIAGNOSIS — E109 Type 1 diabetes mellitus without complications: Secondary | ICD-10-CM | POA: Diagnosis not present

## 2019-07-01 DIAGNOSIS — E039 Hypothyroidism, unspecified: Secondary | ICD-10-CM | POA: Diagnosis not present

## 2019-07-06 DIAGNOSIS — Z23 Encounter for immunization: Secondary | ICD-10-CM | POA: Diagnosis not present

## 2019-07-07 DIAGNOSIS — M8589 Other specified disorders of bone density and structure, multiple sites: Secondary | ICD-10-CM | POA: Diagnosis not present

## 2019-07-07 DIAGNOSIS — I739 Peripheral vascular disease, unspecified: Secondary | ICD-10-CM | POA: Diagnosis not present

## 2019-07-07 DIAGNOSIS — I872 Venous insufficiency (chronic) (peripheral): Secondary | ICD-10-CM | POA: Diagnosis not present

## 2019-07-07 DIAGNOSIS — E1022 Type 1 diabetes mellitus with diabetic chronic kidney disease: Secondary | ICD-10-CM | POA: Diagnosis not present

## 2019-07-07 DIAGNOSIS — H3521 Other non-diabetic proliferative retinopathy, right eye: Secondary | ICD-10-CM | POA: Diagnosis not present

## 2019-07-07 DIAGNOSIS — N183 Chronic kidney disease, stage 3 (moderate): Secondary | ICD-10-CM | POA: Diagnosis not present

## 2019-07-07 DIAGNOSIS — E890 Postprocedural hypothyroidism: Secondary | ICD-10-CM | POA: Diagnosis not present

## 2019-07-07 DIAGNOSIS — Z9641 Presence of insulin pump (external) (internal): Secondary | ICD-10-CM | POA: Diagnosis not present

## 2019-07-07 DIAGNOSIS — E7800 Pure hypercholesterolemia, unspecified: Secondary | ICD-10-CM | POA: Diagnosis not present

## 2019-07-24 ENCOUNTER — Other Ambulatory Visit: Payer: Self-pay

## 2019-07-24 ENCOUNTER — Ambulatory Visit
Admission: RE | Admit: 2019-07-24 | Discharge: 2019-07-24 | Disposition: A | Discharge status: Home / Self Care | Payer: Medicare Other | Source: Ambulatory Visit | Attending: Adult Health | Admitting: Adult Health

## 2019-07-24 DIAGNOSIS — Z1231 Encounter for screening mammogram for malignant neoplasm of breast: Secondary | ICD-10-CM | POA: Diagnosis not present

## 2019-08-10 DIAGNOSIS — E1065 Type 1 diabetes mellitus with hyperglycemia: Secondary | ICD-10-CM | POA: Diagnosis not present

## 2019-08-15 DIAGNOSIS — G5601 Carpal tunnel syndrome, right upper limb: Secondary | ICD-10-CM | POA: Diagnosis not present

## 2019-08-20 DIAGNOSIS — R202 Paresthesia of skin: Secondary | ICD-10-CM | POA: Diagnosis not present

## 2019-08-20 DIAGNOSIS — M79601 Pain in right arm: Secondary | ICD-10-CM | POA: Diagnosis not present

## 2019-08-20 DIAGNOSIS — M25531 Pain in right wrist: Secondary | ICD-10-CM

## 2019-08-20 HISTORY — DX: Pain in right wrist: M25.531

## 2019-09-15 DIAGNOSIS — M25531 Pain in right wrist: Secondary | ICD-10-CM | POA: Diagnosis not present

## 2019-09-15 DIAGNOSIS — R202 Paresthesia of skin: Secondary | ICD-10-CM | POA: Diagnosis not present

## 2019-09-16 ENCOUNTER — Other Ambulatory Visit: Payer: Self-pay

## 2019-10-01 ENCOUNTER — Other Ambulatory Visit: Payer: Self-pay

## 2019-10-01 ENCOUNTER — Encounter (INDEPENDENT_AMBULATORY_CARE_PROVIDER_SITE_OTHER): Payer: Medicare Other | Admitting: Ophthalmology

## 2019-10-01 DIAGNOSIS — E103591 Type 1 diabetes mellitus with proliferative diabetic retinopathy without macular edema, right eye: Secondary | ICD-10-CM

## 2019-10-01 DIAGNOSIS — E103512 Type 1 diabetes mellitus with proliferative diabetic retinopathy with macular edema, left eye: Secondary | ICD-10-CM

## 2019-10-01 DIAGNOSIS — I10 Essential (primary) hypertension: Secondary | ICD-10-CM

## 2019-10-01 DIAGNOSIS — H2702 Aphakia, left eye: Secondary | ICD-10-CM

## 2019-10-01 DIAGNOSIS — H43813 Vitreous degeneration, bilateral: Secondary | ICD-10-CM | POA: Diagnosis not present

## 2019-10-01 DIAGNOSIS — H35033 Hypertensive retinopathy, bilateral: Secondary | ICD-10-CM | POA: Diagnosis not present

## 2019-10-01 DIAGNOSIS — E10311 Type 1 diabetes mellitus with unspecified diabetic retinopathy with macular edema: Secondary | ICD-10-CM

## 2019-10-05 DIAGNOSIS — E7800 Pure hypercholesterolemia, unspecified: Secondary | ICD-10-CM | POA: Diagnosis not present

## 2019-10-05 DIAGNOSIS — E039 Hypothyroidism, unspecified: Secondary | ICD-10-CM | POA: Diagnosis not present

## 2019-10-05 DIAGNOSIS — E109 Type 1 diabetes mellitus without complications: Secondary | ICD-10-CM | POA: Diagnosis not present

## 2019-10-05 DIAGNOSIS — Z9641 Presence of insulin pump (external) (internal): Secondary | ICD-10-CM | POA: Diagnosis not present

## 2019-10-06 DIAGNOSIS — F411 Generalized anxiety disorder: Secondary | ICD-10-CM | POA: Diagnosis not present

## 2019-10-06 DIAGNOSIS — F3341 Major depressive disorder, recurrent, in partial remission: Secondary | ICD-10-CM | POA: Diagnosis not present

## 2019-10-08 DIAGNOSIS — E039 Hypothyroidism, unspecified: Secondary | ICD-10-CM | POA: Diagnosis not present

## 2019-10-08 DIAGNOSIS — H3521 Other non-diabetic proliferative retinopathy, right eye: Secondary | ICD-10-CM | POA: Diagnosis not present

## 2019-10-08 DIAGNOSIS — E7800 Pure hypercholesterolemia, unspecified: Secondary | ICD-10-CM | POA: Diagnosis not present

## 2019-10-08 DIAGNOSIS — E1022 Type 1 diabetes mellitus with diabetic chronic kidney disease: Secondary | ICD-10-CM | POA: Diagnosis not present

## 2019-10-08 DIAGNOSIS — Z9641 Presence of insulin pump (external) (internal): Secondary | ICD-10-CM | POA: Diagnosis not present

## 2019-10-08 DIAGNOSIS — M8589 Other specified disorders of bone density and structure, multiple sites: Secondary | ICD-10-CM | POA: Diagnosis not present

## 2019-10-08 DIAGNOSIS — N1831 Chronic kidney disease, stage 3a: Secondary | ICD-10-CM | POA: Diagnosis not present

## 2019-10-08 DIAGNOSIS — I872 Venous insufficiency (chronic) (peripheral): Secondary | ICD-10-CM | POA: Diagnosis not present

## 2019-10-08 DIAGNOSIS — I739 Peripheral vascular disease, unspecified: Secondary | ICD-10-CM | POA: Diagnosis not present

## 2019-11-04 DIAGNOSIS — R202 Paresthesia of skin: Secondary | ICD-10-CM | POA: Diagnosis not present

## 2019-11-04 DIAGNOSIS — M79644 Pain in right finger(s): Secondary | ICD-10-CM | POA: Diagnosis not present

## 2019-11-04 DIAGNOSIS — M25531 Pain in right wrist: Secondary | ICD-10-CM | POA: Diagnosis not present

## 2019-11-04 HISTORY — DX: Pain in right finger(s): M79.644

## 2019-11-25 DIAGNOSIS — R202 Paresthesia of skin: Secondary | ICD-10-CM | POA: Diagnosis not present

## 2019-11-25 DIAGNOSIS — M79644 Pain in right finger(s): Secondary | ICD-10-CM | POA: Diagnosis not present

## 2019-12-17 DIAGNOSIS — M25531 Pain in right wrist: Secondary | ICD-10-CM | POA: Diagnosis not present

## 2019-12-17 DIAGNOSIS — M13841 Other specified arthritis, right hand: Secondary | ICD-10-CM | POA: Diagnosis not present

## 2019-12-17 DIAGNOSIS — G5601 Carpal tunnel syndrome, right upper limb: Secondary | ICD-10-CM | POA: Diagnosis not present

## 2019-12-20 DIAGNOSIS — G5601 Carpal tunnel syndrome, right upper limb: Secondary | ICD-10-CM

## 2019-12-20 HISTORY — DX: Carpal tunnel syndrome, right upper limb: G56.01

## 2019-12-29 DIAGNOSIS — G5601 Carpal tunnel syndrome, right upper limb: Secondary | ICD-10-CM | POA: Diagnosis not present

## 2020-01-04 DIAGNOSIS — E109 Type 1 diabetes mellitus without complications: Secondary | ICD-10-CM | POA: Diagnosis not present

## 2020-01-04 DIAGNOSIS — E039 Hypothyroidism, unspecified: Secondary | ICD-10-CM | POA: Diagnosis not present

## 2020-01-04 DIAGNOSIS — E7800 Pure hypercholesterolemia, unspecified: Secondary | ICD-10-CM | POA: Diagnosis not present

## 2020-01-12 DIAGNOSIS — M25641 Stiffness of right hand, not elsewhere classified: Secondary | ICD-10-CM | POA: Diagnosis not present

## 2020-01-15 DIAGNOSIS — I872 Venous insufficiency (chronic) (peripheral): Secondary | ICD-10-CM | POA: Diagnosis not present

## 2020-01-15 DIAGNOSIS — E109 Type 1 diabetes mellitus without complications: Secondary | ICD-10-CM | POA: Diagnosis not present

## 2020-01-15 DIAGNOSIS — E1022 Type 1 diabetes mellitus with diabetic chronic kidney disease: Secondary | ICD-10-CM | POA: Diagnosis not present

## 2020-01-15 DIAGNOSIS — E7800 Pure hypercholesterolemia, unspecified: Secondary | ICD-10-CM | POA: Diagnosis not present

## 2020-01-15 DIAGNOSIS — N1831 Chronic kidney disease, stage 3a: Secondary | ICD-10-CM | POA: Diagnosis not present

## 2020-01-15 DIAGNOSIS — Z794 Long term (current) use of insulin: Secondary | ICD-10-CM | POA: Diagnosis not present

## 2020-01-15 DIAGNOSIS — M8589 Other specified disorders of bone density and structure, multiple sites: Secondary | ICD-10-CM | POA: Diagnosis not present

## 2020-01-15 DIAGNOSIS — I739 Peripheral vascular disease, unspecified: Secondary | ICD-10-CM | POA: Diagnosis not present

## 2020-01-15 DIAGNOSIS — E039 Hypothyroidism, unspecified: Secondary | ICD-10-CM | POA: Diagnosis not present

## 2020-01-15 DIAGNOSIS — H3521 Other non-diabetic proliferative retinopathy, right eye: Secondary | ICD-10-CM | POA: Diagnosis not present

## 2020-01-15 DIAGNOSIS — Z9641 Presence of insulin pump (external) (internal): Secondary | ICD-10-CM | POA: Diagnosis not present

## 2020-01-21 DIAGNOSIS — M25641 Stiffness of right hand, not elsewhere classified: Secondary | ICD-10-CM | POA: Diagnosis not present

## 2020-02-27 DIAGNOSIS — N3090 Cystitis, unspecified without hematuria: Secondary | ICD-10-CM | POA: Diagnosis not present

## 2020-02-27 DIAGNOSIS — R300 Dysuria: Secondary | ICD-10-CM | POA: Diagnosis not present

## 2020-02-29 ENCOUNTER — Encounter (INDEPENDENT_AMBULATORY_CARE_PROVIDER_SITE_OTHER): Payer: Medicare Other | Admitting: Ophthalmology

## 2020-02-29 ENCOUNTER — Other Ambulatory Visit: Payer: Self-pay

## 2020-02-29 DIAGNOSIS — H338 Other retinal detachments: Secondary | ICD-10-CM

## 2020-02-29 DIAGNOSIS — E11319 Type 2 diabetes mellitus with unspecified diabetic retinopathy without macular edema: Secondary | ICD-10-CM | POA: Diagnosis not present

## 2020-02-29 DIAGNOSIS — H43813 Vitreous degeneration, bilateral: Secondary | ICD-10-CM

## 2020-02-29 DIAGNOSIS — I10 Essential (primary) hypertension: Secondary | ICD-10-CM | POA: Diagnosis not present

## 2020-02-29 DIAGNOSIS — H35033 Hypertensive retinopathy, bilateral: Secondary | ICD-10-CM

## 2020-02-29 DIAGNOSIS — E113533 Type 2 diabetes mellitus with proliferative diabetic retinopathy with traction retinal detachment not involving the macula, bilateral: Secondary | ICD-10-CM | POA: Diagnosis not present

## 2020-04-12 DIAGNOSIS — Z794 Long term (current) use of insulin: Secondary | ICD-10-CM | POA: Diagnosis not present

## 2020-04-12 DIAGNOSIS — E1065 Type 1 diabetes mellitus with hyperglycemia: Secondary | ICD-10-CM | POA: Diagnosis not present

## 2020-04-22 DIAGNOSIS — N1831 Chronic kidney disease, stage 3a: Secondary | ICD-10-CM | POA: Diagnosis not present

## 2020-04-22 DIAGNOSIS — E109 Type 1 diabetes mellitus without complications: Secondary | ICD-10-CM | POA: Diagnosis not present

## 2020-04-22 DIAGNOSIS — E039 Hypothyroidism, unspecified: Secondary | ICD-10-CM | POA: Diagnosis not present

## 2020-04-22 DIAGNOSIS — E7800 Pure hypercholesterolemia, unspecified: Secondary | ICD-10-CM | POA: Diagnosis not present

## 2020-04-27 DIAGNOSIS — L602 Onychogryphosis: Secondary | ICD-10-CM | POA: Diagnosis not present

## 2020-04-27 DIAGNOSIS — I739 Peripheral vascular disease, unspecified: Secondary | ICD-10-CM | POA: Diagnosis not present

## 2020-04-27 DIAGNOSIS — E109 Type 1 diabetes mellitus without complications: Secondary | ICD-10-CM | POA: Diagnosis not present

## 2020-04-27 DIAGNOSIS — Z794 Long term (current) use of insulin: Secondary | ICD-10-CM | POA: Diagnosis not present

## 2020-04-28 DIAGNOSIS — G4730 Sleep apnea, unspecified: Secondary | ICD-10-CM | POA: Diagnosis not present

## 2020-04-28 DIAGNOSIS — E7800 Pure hypercholesterolemia, unspecified: Secondary | ICD-10-CM | POA: Diagnosis not present

## 2020-04-28 DIAGNOSIS — N1831 Chronic kidney disease, stage 3a: Secondary | ICD-10-CM | POA: Diagnosis not present

## 2020-04-28 DIAGNOSIS — M8589 Other specified disorders of bone density and structure, multiple sites: Secondary | ICD-10-CM | POA: Diagnosis not present

## 2020-04-28 DIAGNOSIS — E10319 Type 1 diabetes mellitus with unspecified diabetic retinopathy without macular edema: Secondary | ICD-10-CM | POA: Diagnosis not present

## 2020-04-28 DIAGNOSIS — Z9641 Presence of insulin pump (external) (internal): Secondary | ICD-10-CM | POA: Diagnosis not present

## 2020-04-28 DIAGNOSIS — E1022 Type 1 diabetes mellitus with diabetic chronic kidney disease: Secondary | ICD-10-CM | POA: Diagnosis not present

## 2020-04-28 DIAGNOSIS — E890 Postprocedural hypothyroidism: Secondary | ICD-10-CM | POA: Diagnosis not present

## 2020-04-28 DIAGNOSIS — I8311 Varicose veins of right lower extremity with inflammation: Secondary | ICD-10-CM | POA: Diagnosis not present

## 2020-04-28 DIAGNOSIS — I8312 Varicose veins of left lower extremity with inflammation: Secondary | ICD-10-CM | POA: Diagnosis not present

## 2020-04-28 DIAGNOSIS — F419 Anxiety disorder, unspecified: Secondary | ICD-10-CM | POA: Diagnosis not present

## 2020-05-04 DIAGNOSIS — H6123 Impacted cerumen, bilateral: Secondary | ICD-10-CM | POA: Diagnosis not present

## 2020-05-06 DIAGNOSIS — I739 Peripheral vascular disease, unspecified: Secondary | ICD-10-CM | POA: Diagnosis not present

## 2020-05-06 DIAGNOSIS — I872 Venous insufficiency (chronic) (peripheral): Secondary | ICD-10-CM | POA: Diagnosis not present

## 2020-06-15 ENCOUNTER — Other Ambulatory Visit: Payer: Self-pay | Admitting: Adult Health

## 2020-06-15 DIAGNOSIS — Z1231 Encounter for screening mammogram for malignant neoplasm of breast: Secondary | ICD-10-CM

## 2020-07-09 DIAGNOSIS — Z23 Encounter for immunization: Secondary | ICD-10-CM | POA: Diagnosis not present

## 2020-07-13 DIAGNOSIS — R350 Frequency of micturition: Secondary | ICD-10-CM | POA: Diagnosis not present

## 2020-07-13 DIAGNOSIS — N3946 Mixed incontinence: Secondary | ICD-10-CM | POA: Diagnosis not present

## 2020-07-13 DIAGNOSIS — N3942 Incontinence without sensory awareness: Secondary | ICD-10-CM | POA: Diagnosis not present

## 2020-07-25 ENCOUNTER — Ambulatory Visit
Admission: RE | Admit: 2020-07-25 | Discharge: 2020-07-25 | Disposition: A | Discharge status: Home / Self Care | Payer: Medicare Other | Source: Ambulatory Visit | Attending: Adult Health | Admitting: Adult Health

## 2020-07-25 ENCOUNTER — Other Ambulatory Visit: Payer: Self-pay

## 2020-07-25 DIAGNOSIS — Z1231 Encounter for screening mammogram for malignant neoplasm of breast: Secondary | ICD-10-CM | POA: Diagnosis not present

## 2020-08-01 DIAGNOSIS — E7800 Pure hypercholesterolemia, unspecified: Secondary | ICD-10-CM | POA: Diagnosis not present

## 2020-08-01 DIAGNOSIS — E109 Type 1 diabetes mellitus without complications: Secondary | ICD-10-CM | POA: Diagnosis not present

## 2020-08-01 DIAGNOSIS — N1831 Chronic kidney disease, stage 3a: Secondary | ICD-10-CM | POA: Diagnosis not present

## 2020-08-03 DIAGNOSIS — Z794 Long term (current) use of insulin: Secondary | ICD-10-CM | POA: Diagnosis not present

## 2020-08-03 DIAGNOSIS — Z9641 Presence of insulin pump (external) (internal): Secondary | ICD-10-CM | POA: Diagnosis not present

## 2020-08-03 DIAGNOSIS — I872 Venous insufficiency (chronic) (peripheral): Secondary | ICD-10-CM | POA: Diagnosis not present

## 2020-08-03 DIAGNOSIS — E1022 Type 1 diabetes mellitus with diabetic chronic kidney disease: Secondary | ICD-10-CM | POA: Diagnosis not present

## 2020-08-03 DIAGNOSIS — N1831 Chronic kidney disease, stage 3a: Secondary | ICD-10-CM | POA: Diagnosis not present

## 2020-08-03 DIAGNOSIS — M8589 Other specified disorders of bone density and structure, multiple sites: Secondary | ICD-10-CM | POA: Diagnosis not present

## 2020-08-03 DIAGNOSIS — H3521 Other non-diabetic proliferative retinopathy, right eye: Secondary | ICD-10-CM | POA: Diagnosis not present

## 2020-08-03 DIAGNOSIS — E039 Hypothyroidism, unspecified: Secondary | ICD-10-CM | POA: Diagnosis not present

## 2020-08-03 DIAGNOSIS — E7800 Pure hypercholesterolemia, unspecified: Secondary | ICD-10-CM | POA: Diagnosis not present

## 2020-08-03 DIAGNOSIS — I739 Peripheral vascular disease, unspecified: Secondary | ICD-10-CM | POA: Diagnosis not present

## 2020-08-17 DIAGNOSIS — F411 Generalized anxiety disorder: Secondary | ICD-10-CM | POA: Diagnosis not present

## 2020-08-17 DIAGNOSIS — F3342 Major depressive disorder, recurrent, in full remission: Secondary | ICD-10-CM | POA: Diagnosis not present

## 2020-08-29 DIAGNOSIS — Z853 Personal history of malignant neoplasm of breast: Secondary | ICD-10-CM | POA: Diagnosis not present

## 2020-08-29 DIAGNOSIS — F32A Depression, unspecified: Secondary | ICD-10-CM | POA: Diagnosis not present

## 2020-08-29 DIAGNOSIS — Z87891 Personal history of nicotine dependence: Secondary | ICD-10-CM | POA: Diagnosis not present

## 2020-08-29 DIAGNOSIS — G4733 Obstructive sleep apnea (adult) (pediatric): Secondary | ICD-10-CM | POA: Diagnosis not present

## 2020-08-29 DIAGNOSIS — R29898 Other symptoms and signs involving the musculoskeletal system: Secondary | ICD-10-CM | POA: Diagnosis not present

## 2020-08-29 DIAGNOSIS — E7800 Pure hypercholesterolemia, unspecified: Secondary | ICD-10-CM | POA: Diagnosis not present

## 2020-08-29 DIAGNOSIS — I129 Hypertensive chronic kidney disease with stage 1 through stage 4 chronic kidney disease, or unspecified chronic kidney disease: Secondary | ICD-10-CM | POA: Diagnosis not present

## 2020-08-29 DIAGNOSIS — Z9641 Presence of insulin pump (external) (internal): Secondary | ICD-10-CM | POA: Diagnosis not present

## 2020-08-29 DIAGNOSIS — R0609 Other forms of dyspnea: Secondary | ICD-10-CM | POA: Diagnosis not present

## 2020-08-29 DIAGNOSIS — I340 Nonrheumatic mitral (valve) insufficiency: Secondary | ICD-10-CM | POA: Diagnosis not present

## 2020-08-29 DIAGNOSIS — R531 Weakness: Secondary | ICD-10-CM | POA: Diagnosis not present

## 2020-08-29 DIAGNOSIS — E1022 Type 1 diabetes mellitus with diabetic chronic kidney disease: Secondary | ICD-10-CM | POA: Diagnosis not present

## 2020-08-29 DIAGNOSIS — E785 Hyperlipidemia, unspecified: Secondary | ICD-10-CM | POA: Diagnosis not present

## 2020-08-29 DIAGNOSIS — I361 Nonrheumatic tricuspid (valve) insufficiency: Secondary | ICD-10-CM | POA: Diagnosis not present

## 2020-08-29 DIAGNOSIS — D72829 Elevated white blood cell count, unspecified: Secondary | ICD-10-CM | POA: Diagnosis not present

## 2020-08-29 DIAGNOSIS — N170 Acute kidney failure with tubular necrosis: Secondary | ICD-10-CM | POA: Diagnosis not present

## 2020-08-29 DIAGNOSIS — I959 Hypotension, unspecified: Secondary | ICD-10-CM | POA: Diagnosis not present

## 2020-08-29 DIAGNOSIS — G4700 Insomnia, unspecified: Secondary | ICD-10-CM | POA: Diagnosis not present

## 2020-08-29 DIAGNOSIS — J9811 Atelectasis: Secondary | ICD-10-CM | POA: Diagnosis not present

## 2020-08-29 DIAGNOSIS — R008 Other abnormalities of heart beat: Secondary | ICD-10-CM | POA: Diagnosis not present

## 2020-08-29 DIAGNOSIS — R0602 Shortness of breath: Secondary | ICD-10-CM | POA: Diagnosis not present

## 2020-08-29 DIAGNOSIS — F419 Anxiety disorder, unspecified: Secondary | ICD-10-CM | POA: Diagnosis not present

## 2020-08-29 DIAGNOSIS — Z79899 Other long term (current) drug therapy: Secondary | ICD-10-CM | POA: Diagnosis not present

## 2020-08-29 DIAGNOSIS — N3281 Overactive bladder: Secondary | ICD-10-CM | POA: Diagnosis not present

## 2020-08-29 DIAGNOSIS — R0600 Dyspnea, unspecified: Secondary | ICD-10-CM | POA: Diagnosis not present

## 2020-08-29 DIAGNOSIS — I493 Ventricular premature depolarization: Secondary | ICD-10-CM | POA: Diagnosis not present

## 2020-08-29 DIAGNOSIS — M1990 Unspecified osteoarthritis, unspecified site: Secondary | ICD-10-CM | POA: Diagnosis not present

## 2020-08-29 DIAGNOSIS — E119 Type 2 diabetes mellitus without complications: Secondary | ICD-10-CM | POA: Diagnosis not present

## 2020-08-29 DIAGNOSIS — N183 Chronic kidney disease, stage 3 unspecified: Secondary | ICD-10-CM | POA: Diagnosis not present

## 2020-08-29 DIAGNOSIS — R748 Abnormal levels of other serum enzymes: Secondary | ICD-10-CM | POA: Diagnosis not present

## 2020-08-29 DIAGNOSIS — Z882 Allergy status to sulfonamides status: Secondary | ICD-10-CM | POA: Diagnosis not present

## 2020-08-30 DIAGNOSIS — E119 Type 2 diabetes mellitus without complications: Secondary | ICD-10-CM | POA: Diagnosis not present

## 2020-08-30 DIAGNOSIS — R0602 Shortness of breath: Secondary | ICD-10-CM | POA: Diagnosis not present

## 2020-08-30 DIAGNOSIS — D72829 Elevated white blood cell count, unspecified: Secondary | ICD-10-CM | POA: Diagnosis not present

## 2020-08-30 DIAGNOSIS — R748 Abnormal levels of other serum enzymes: Secondary | ICD-10-CM | POA: Diagnosis not present

## 2020-08-30 DIAGNOSIS — I959 Hypotension, unspecified: Secondary | ICD-10-CM | POA: Diagnosis not present

## 2020-08-30 DIAGNOSIS — R0600 Dyspnea, unspecified: Secondary | ICD-10-CM | POA: Diagnosis not present

## 2020-09-09 ENCOUNTER — Telehealth: Payer: Self-pay | Admitting: Cardiology

## 2020-09-09 NOTE — Telephone Encounter (Signed)
Patient mentioned that she saw Dr. Harriet Masson at [facility] and was told to schedule appt. Patient needs a referral to see doctor. Asked if she could bring her medical records up to [facility] office. Please call at [phone]

## 2020-09-22 DIAGNOSIS — I700 Atherosclerosis of aorta: Secondary | ICD-10-CM | POA: Diagnosis not present

## 2020-09-22 DIAGNOSIS — Z6826 Body mass index (BMI) 26.0-26.9, adult: Secondary | ICD-10-CM | POA: Diagnosis not present

## 2020-09-22 DIAGNOSIS — R0600 Dyspnea, unspecified: Secondary | ICD-10-CM | POA: Diagnosis not present

## 2020-09-22 DIAGNOSIS — N390 Urinary tract infection, site not specified: Secondary | ICD-10-CM | POA: Diagnosis not present

## 2020-09-26 ENCOUNTER — Other Ambulatory Visit: Payer: Self-pay

## 2020-09-26 DIAGNOSIS — C801 Malignant (primary) neoplasm, unspecified: Secondary | ICD-10-CM | POA: Insufficient documentation

## 2020-09-26 DIAGNOSIS — E079 Disorder of thyroid, unspecified: Secondary | ICD-10-CM | POA: Insufficient documentation

## 2020-09-26 DIAGNOSIS — H269 Unspecified cataract: Secondary | ICD-10-CM | POA: Insufficient documentation

## 2020-09-26 DIAGNOSIS — M1990 Unspecified osteoarthritis, unspecified site: Secondary | ICD-10-CM | POA: Insufficient documentation

## 2020-09-26 HISTORY — DX: Unspecified cataract: H26.9

## 2020-09-27 ENCOUNTER — Encounter: Payer: Self-pay | Admitting: *Deleted

## 2020-09-28 ENCOUNTER — Encounter: Payer: Self-pay | Admitting: Cardiology

## 2020-09-28 ENCOUNTER — Ambulatory Visit (INDEPENDENT_AMBULATORY_CARE_PROVIDER_SITE_OTHER): Payer: Medicare Other

## 2020-09-28 ENCOUNTER — Other Ambulatory Visit: Payer: Self-pay

## 2020-09-28 ENCOUNTER — Ambulatory Visit (INDEPENDENT_AMBULATORY_CARE_PROVIDER_SITE_OTHER): Payer: Medicare Other | Admitting: Cardiology

## 2020-09-28 VITALS — BP 112/60 | HR 72 | Ht 67.0 in | Wt 166.8 lb

## 2020-09-28 DIAGNOSIS — I493 Ventricular premature depolarization: Secondary | ICD-10-CM

## 2020-09-28 DIAGNOSIS — E782 Mixed hyperlipidemia: Secondary | ICD-10-CM

## 2020-09-28 DIAGNOSIS — Z794 Long term (current) use of insulin: Secondary | ICD-10-CM | POA: Diagnosis not present

## 2020-09-28 DIAGNOSIS — R0600 Dyspnea, unspecified: Secondary | ICD-10-CM | POA: Diagnosis not present

## 2020-09-28 DIAGNOSIS — I10 Essential (primary) hypertension: Secondary | ICD-10-CM

## 2020-09-28 DIAGNOSIS — E119 Type 2 diabetes mellitus without complications: Secondary | ICD-10-CM

## 2020-09-28 DIAGNOSIS — R0609 Other forms of dyspnea: Secondary | ICD-10-CM

## 2020-09-28 MED ORDER — POTASSIUM CHLORIDE CRYS ER 20 MEQ PO TBCR: 20.0000 meq | EXTENDED_RELEASE_TABLET | Freq: Every day | ORAL | 3 refills | Status: DC

## 2020-09-28 MED ORDER — FUROSEMIDE 20 MG PO TABS: 20.0000 mg | ORAL_TABLET | Freq: Every day | ORAL | 3 refills | Status: DC

## 2020-09-28 NOTE — Progress Notes (Signed)
Cardiology Office Note:    Date:  09/28/2020   ID:  April Long, DOB 1946-03-06, MRN [id number]  PCP:  Ernestene Kiel, MD  Cardiologist:  Berniece Salines, DO  Electrophysiologist:  None   Referring MD: Ernestene Kiel, MD   " I am still short of breath"  History of Present Illness:    April Long is a 74 y.o. female with a hx of diabetes mellitus on insulin pump, hyperlipidemia, overactive bladder, osteoporosis, depression, insomnia and OSA on CPAP is here today for a follow-up visit.  Patient was first seen in the hospital recently where she presented for shortness of breath and exertion.  During hospital stay she had a echocardiogram which was essentially normal she also had an stress test we did not show any ischemia or infarction.  However during her stress test the patient was reported to have significant PVC burden.   The patient tells me that the shortness of breath on exertion is getting worse and she is concerned about this.  She can barely, the bathroom without being short of breath. She has not had any chest pain lightheadedness or dizziness.  She has had a few times where she feels her heart skipping.  Her biggest issue that she really wants addressed today is the shortness of breath on exertion.  Past Medical History:  Diagnosis Date  . Abnormal gait 06/10/2018  . Acute pain of right wrist 08/20/2019  . Aphakia of left eye 07/19/2015  . 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 Cancer ([place])   . Carpal tunnel syndrome of right wrist 12/20/2019  . Cataract 09/26/2020  . Chronic venous insufficiency 02/05/2018  . Diabetes mellitus type 1 ([place]) 11/20/2016  . Hypercholesterolemia 11/20/2016  . Hypertension   . Hypothyroidism   . Insulin pump status 11/20/2016  . 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 Long toenail 01/02/2016  . Nondiabetic proliferative retinopathy  09/18/2011  . Osteopenia of multiple sites 11/20/2016  . Osteoporosis    early stage  . PAD (peripheral artery disease) ([place]) 11/27/2017  . Pain in right foot 11/26/2017  . Pain of right thumb 11/04/2019  . Paresthesia of upper limb 06/24/2019  . Primary hypothyroidism 11/20/2016  . Pseudophakia of right eye 07/19/2015  . Right posterior capsular opacification 07/19/2015  . Slurring of speech 08/03/2013  . Stage 3a chronic kidney disease ([place]) 12/31/2018  . Syncope 08/03/2013  . Thyroid disease   . 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 BREAST LUMPECTOMY Left   . BREAST SURGERY  2012   lumpectomy  . EYE SURGERY  1983   retinal reattachment  . TUBAL LIGATION  1974    Current Medications: Current Meds  Medication Sig  . atorvastatin (LIPITOR) 10 MG tablet Take 10 mg by mouth daily.  Marland Kitchen buPROPion (WELLBUTRIN XL) 300 MG 24 hr tablet Take 1 tablet once a day for anxiety and mood  . Calcium Carbonate-Vitamin D 600-400 MG-UNIT tablet Take 1 tablet by mouth daily. With food  . co-enzyme Q-10 30 MG capsule Take 100 mg by mouth daily.   . CYANOCOBALAMIN-CAFFEINE PO [Gummy] Take as directed  . escitalopram (LEXAPRO) 20 MG tablet   . Insulin Aspart (NOVOLOG [place]) Inject into the skin. Insulin pump   . levothyroxine (SYNTHROID) 50 MCG tablet Take 1 tablet by mouth as directed.  Marland Kitchen  lisinopril (PRINIVIL,ZESTRIL) 10 MG tablet Take 10 mg by mouth daily.  . Multiple Vitamin (MULTIVITAMIN) tablet Take 2 tablets by mouth daily.   . nitrofurantoin, macrocrystal-monohydrate, (MACROBID) 100 MG capsule Take 100 mg by mouth 2 (two) times daily.  Marland Kitchen oxybutynin (DITROPAN-XL) 10 MG 24 hr tablet oxybutynin chloride ER 10 mg tablet,extended release 24 hr  TK 1 T PO QD  . VITAMIN D, CHOLECALCIFEROL, PO Take 1,000 Units by mouth.   . ZOLPIDEM TARTRATE PO Take by mouth.     Allergies:   Sulfa drugs cross reactors   Social History   Socioeconomic History  .  Marital status: Married    Spouse name: Not on file  . Number of children: Not on file  . Years of education: Not on file  . Highest education level: Not on file  Occupational History  . Not on file  Tobacco Use  . Smoking status: Former Smoker    Quit date: 12/19/2000    Years since quitting: 19.7  . Smokeless tobacco: Never Used  Substance and Sexual Activity  . Alcohol use: Yes    Alcohol/week: 7.0 standard drinks    Types: 7 Glasses of wine per week    Comment: 1 -2 glasses of wine daily or weekly  . Drug use: No  . Sexual activity: Yes  Other Topics Concern  . Not on file  Social History Narrative  . Not on file   Social Determinants of Health   Financial Resource Strain:   . Difficulty of Paying Living Expenses: Not on file  Food Insecurity:   . Worried About Charity fundraiser in the Last Year: Not on file  . Ran Out of Food in the Last Year: Not on file  Transportation Needs:   . Lack of Transportation (Medical): Not on file  . Lack of Transportation (Non-Medical): Not on file  Physical Activity:   . Days of Exercise per Week: Not on file  . Minutes of Exercise per Session: Not on file  Stress:   . Feeling of Stress : Not on file  Social Connections:   . Frequency of Communication with Friends and Family: Not on file  . Frequency of Social Gatherings with Friends and Family: Not on file  . Attends Religious Services: Not on file  . Active Member of Clubs or Organizations: Not on file  . Attends Archivist Meetings: Not on file  . Marital Status: Not on file     Family History: The patient's family history includes Alzheimer's disease in her mother; Heart disease in her father; Heart failure in her father. There is no history of Breast cancer.  ROS:   Review of Systems  Constitution: Negative for decreased appetite, fever and weight gain.  HENT: Negative for congestion, ear discharge, hoarse voice and sore throat.   Eyes: Negative for discharge,  redness, vision loss in right eye and visual halos.  Cardiovascular: Reports dyspnea on exertion.  Negative for leg swelling, orthopnea and palpitations.  Respiratory: Negative for cough, hemoptysis, shortness of breath and snoring.   Endocrine: Negative for heat intolerance and polyphagia.  Hematologic/Lymphatic: Negative for bleeding problem. Does not bruise/bleed easily.  Skin: Negative for flushing, nail changes, rash and suspicious lesions.  Musculoskeletal: Negative for arthritis, joint pain, muscle cramps, myalgias, neck pain and stiffness.  Gastrointestinal: Negative for abdominal pain, bowel incontinence, diarrhea and excessive appetite.  Genitourinary: Negative for decreased libido, genital sores and incomplete emptying.  Neurological: Negative for brief paralysis, focal weakness,  headaches and loss of balance.  Psychiatric/Behavioral: Negative for altered mental status, depression and suicidal ideas.  Allergic/Immunologic: Negative for HIV exposure and persistent infections.    EKGs/Labs/Other Studies Reviewed:    The following studies were reviewed today:   EKG:  The ekg ordered today demonstrates sinus rhythm, heart rate 82 bpm Echocardiogram done at [facility] shows overall left systolic function 60 to 68%.  Right ventricle is normal size.  Left atrium is normal size.  Right atrium is normal in size and function.  There is mild aortic valve sclerosis.  Trace mitral regurgitation is present.  Mild tricuspid regurgitation is present.  Pulmonary artery systolic pressure 22 mmHg.  Aortic arch, ascending aorta and aortic root are appears to be within normals.  Pharmacologic nuclear stress test no evidence of infarction or ischemia.  Left ventricle ejection fraction 69.  With normal wall motion.  CT of the chest no pulmonary embolism.  Mild pulmonary atelectasis or scarring.  Calcified atherosclerosis and injection of abdomen  Recent Labs: No results found for requested labs  within last 8760 hours.  Recent Lipid Panel No results found for: CHOL, TRIG, HDL, CHOLHDL, VLDL, LDLCALC, LDLDIRECT  Physical Exam:    VS:  BP 112/60   Pulse 72   Ht _0  (1.702 m)   Wt 166 lb 12.8 oz (75.7 kg)   SpO2 98%   BMI 26.12 kg/m     Wt Readings from Last 3 Encounters:  09/28/20 166 lb 12.8 oz (75.7 kg)  06/10/19 163 lb 12.8 oz (74.3 kg)  03/12/18 186 lb 12.8 oz (84.7 kg)     GEN: Well nourished, well developed in no acute distress HEENT: Normal NECK: No JVD; No carotid bruits LYMPHATICS: No lymphadenopathy CARDIAC: S1S2 noted,RRR, no murmurs, rubs, gallops RESPIRATORY:  Bibasilar crackles without rales, wheezing or rhonchi  ABDOMEN: Soft, non-tender, non-distended, +bowel sounds, no guarding. EXTREMITIES: No edema, No cyanosis, no clubbing MUSCULOSKELETAL:  No deformity  SKIN: Warm and dry NEUROLOGIC:  Alert and oriented x 3, non-focal PSYCHIATRIC:  Normal affect, good insight  ASSESSMENT:    1. PVC (premature ventricular contraction)   2. DOE (dyspnea on exertion)   3. Primary hypertension   4. Insulin-requiring or dependent type II diabetes mellitus ([place])   5. Mixed hyperlipidemia    PLAN:     Clinical exam is suggesting bibasilar crackles.  Elected to start the patient on low-dose diuretics with potassium supplement and monitor.  In addition she has a history of smoking so we will get PFTs to make sure that she is not experiencing any obstructive lung process.  She has had a recent stress test which was normal however there is report of frequent PVC with this electively to monitor the patient to reassess her PVC burden.  Hypertension her blood pressure deceptively in the office. Hyperlipidemia continue her current medication regimen. Diabetes mellitus being managed by her PCP.  The patient is in agreement with the above plan. The patient left the office in stable condition.  The patient will follow up in 3 to 4 weeks.   Medication Adjustments/Labs  and Tests Ordered: Current medicines are reviewed at length with the patient today.  Concerns regarding medicines are outlined above.  Orders Placed This Encounter  Procedures  . LONG TERM MONITOR (3-14 DAYS)  . EKG 12-Lead  . Pulmonary function test   Meds ordered this encounter  Medications  . potassium chloride SA (KLOR-CON) 20 MEQ tablet    Sig: Take 1 tablet (20 mEq  total) by mouth daily.    Dispense:  90 tablet    Refill:  3  . furosemide (LASIX) 20 MG tablet    Sig: Take 1 tablet (20 mg total) by mouth daily.    Dispense:  90 tablet    Refill:  3    Patient Instructions  Medication Instructions:  Your physician has recommended you make the following change in your medication:   Start lasix 20 mg daily. Take KCL 20 mEq daily.  *If you need a refill on your cardiac medications before your next appointment, please call your pharmacy*   Lab Work: None ordered If you have labs (blood work) drawn today and your tests are completely normal, you will receive your results only by: Marland Kitchen MyChart Message (if you have MyChart) OR . A paper copy in the mail If you have any lab test that is abnormal or we need to change your treatment, we will call you to review the results.   Testing/Procedures: Your physician has recommended that you have a pulmonary function test. Pulmonary Function Tests are a group of tests that measure how well air moves in and out of your lungs.   WHY IS MY DOCTOR PRESCRIBING ZIO? The Zio system is proven and trusted by physicians to detect and diagnose irregular heart rhythms -- and has been prescribed to hundreds of thousands of patients.  The FDA has cleared the Zio system to monitor for many different kinds of irregular heart rhythms. In a study, physicians were able to reach a diagnosis 90% of the time with the Zio system1.  You can wear the Zio monitor -- a small, discreet, comfortable patch -- during your normal day-to-day activity, including while  you sleep, shower, and exercise, while it records every single heartbeat for analysis.  1Barrett, P., et al. Comparison of 24 Hour Holter Monitoring Versus 14 Day Novel Adhesive Patch Electrocardiographic Monitoring. [place], 2014.  ZIO VS. HOLTER MONITORING The Zio monitor can be comfortably worn for up to 14 days. Holter monitors can be worn for 24 to 48 hours, limiting the time to record any irregular heart rhythms you may have. Zio is able to capture data for the 51% of patients who have their first symptom-triggered arrhythmia after 48 hours.1  LIVE WITHOUT RESTRICTIONS The Zio ambulatory cardiac monitor is a small, unobtrusive, and water-resistant patch--you might even forget you're wearing it. The Zio monitor records and stores every beat of your heart, whether you're sleeping, working out, or showering. Wear the monitor for 3 days, remove 10/01/20 after 3:00 pm.    Follow-Up: At [facility], you and your health needs are our priority.  As part of our continuing mission to provide you with exceptional heart care, we have created designated Provider Care Teams.  These Care Teams include your primary Cardiologist (physician) and Advanced Practice Providers (APPs -  Physician Assistants and Nurse Practitioners) who all work together to provide you with the care you need, when you need it.  We recommend signing up for the patient portal called "MyChart".  Sign up information is provided on this After Visit Summary.  MyChart is used to connect with patients for Virtual Visits (Telemedicine).  Patients are able to view lab/test results, encounter notes, upcoming appointments, etc.  Non-urgent messages can be sent to your provider as well.   To learn more about what you can do with MyChart, go to [email or web address].    Your next appointment:   3 week(s)  The format  for your next appointment:   In Person  Provider:   Berniece Salines, DO   Other  Instructions Potassium Chloride Extended-Release Capsules What is this medicine? POTASSIUM CHLORIDE (poe TASS i um KLOOR ide) is a potassium supplement used to prevent and to treat low potassium. Potassium is important for the heart, muscles, and nerves. Too much or too little potassium in the body can cause serious problems. This medicine may be used for other purposes; ask your health care provider or pharmacist if you have questions. COMMON BRAND NAME(S): Klor-Con, Micro-K, Micro-K Extencaps What should I tell my health care provider before I take this medicine? They need to know if you have any of these conditions:  Addison disease  dehydration  diabetes, high blood sugar  difficulty swallowing  heart disease  high levels of potassium in the blood  irregular heartbeat or rhythm  kidney disease  large areas of burned skin  stomach ulcers, other stomach or intestine problems  an unusual or allergic reaction to potassium, other medicines, foods, dyes, or preservatives  pregnant or trying to get pregnant  breast-feeding How should I use this medicine? Take this drug by mouth with a glass of water. Take it as directed on the prescription label at the same time every day. Take it with food. Do not cut, crush, chew, or suck this drug. Swallow the capsules whole. You may open the capsule and put the contents in a teaspoon of soft food, such as applesauce or pudding. Do not add to hot foods. Swallow the mixture right away. Do not chew the mixture. Drink a glass of water or juice after taking the mixture. Keep taking this medicine unless your health care provider tells you to stop. Talk to your health care provider about the use of this drug in children. Special care may be needed. Overdosage: If you think you have taken too much of this medicine contact a poison control center or emergency room at once. NOTE: This medicine is only for you. Do not share this medicine with  others. What if I miss a dose? If you miss a dose, take it as soon as you can. If it is almost time for your next dose, take only that dose. Do not take double or extra doses. What may interact with this medicine? Do not take this medicine with any of the following medications:  certain diuretics such as spironolactone, triamterene  certain medicines for stomach problems like atropine; difenoxin and glycopyrrolate  eplerenone  sodium polystyrene sulfonate This medicine may also interact with the following medications:  certain medicines for blood pressure or heart disease like lisinopril, losartan, quinapril, valsartan  medicines that lower your chance of fighting infection such as cyclosporine, tacrolimus  NSAIDs, medicines for pain and inflammation, like ibuprofen or naproxen  other potassium supplements  salt substitutes This list may not describe all possible interactions. Give your health care provider a list of all the medicines, herbs, non-prescription drugs, or dietary supplements you use. Also tell them if you smoke, drink alcohol, or use illegal drugs. Some items may interact with your medicine. What should I watch for while using this medicine? Visit your doctor or health care professional for regular check ups. You will need lab work done regularly. You may need to be on a special diet while taking this medicine. Ask your doctor. What side effects may I notice from receiving this medicine? Side effects that you should report to your doctor or health care professional as soon as  possible:  allergic reactions like skin rash, itching or hives, swelling of the face, lips, or tongue  black, tarry stools  breathing problems  confusion  heartburn  fast, irregular heartbeat  feeling faint or lightheaded, falls  low blood pressure  numbness or tingling in hands or feet  pain when swallowing  unusually weak or tired  weakness, heaviness of legs Side effects  that usually do not require medical attention (report to your doctor or health care professional if they continue or are bothersome):  diarrhea  nausea, vomiting  stomach pain This list may not describe all possible side effects. Call your doctor for medical advice about side effects. You may report side effects to FDA at 1-800-FDA-1088. Where should I keep my medicine? Keep out of the reach of children. Store at room temperature between 15 and 30 degrees C (59 and 86 degrees F ). Keep bottle closed tightly to protect this medicine from light and moisture. Throw away any unused medicine after the expiration date. NOTE: This sheet is a summary. It may not cover all possible information. If you have questions about this medicine, talk to your doctor, pharmacist, or health care provider.  2020 Elsevier/Gold Standard (2019-08-04 16:43:28) Furosemide Oral Tablets What is this medicine? FUROSEMIDE (fyoor [place] se mide) is a diuretic. It helps you make more urine and to lose salt and excess water from your body. It treats swelling from heart, kidney, or liver disease. It also treats high blood pressure. This medicine may be used for other purposes; ask your health care provider or pharmacist if you have questions. COMMON BRAND NAME(S): Active-Medicated Specimen Kit, Delone, Diuscreen, Lasix, RX Specimen Collection Kit, Specimen Collection Kit, URINX Medicated Specimen Collection What should I tell my health care provider before I take this medicine? They need to know if you have any of these conditions:  abnormal blood electrolytes  diarrhea or vomiting  gout  heart disease  kidney disease, small amounts of urine, or difficulty passing urine  liver disease  thyroid disease  an unusual or allergic reaction to furosemide, sulfa drugs, other medicines, foods, dyes, or preservatives  pregnant or trying to get pregnant  breast-feeding How should I use this medicine? Take this drug by  mouth. Take it as directed on the prescription label at the same time every day. You can take it with or without food. If it upsets your stomach, take it with food. Keep taking it unless your health care provider tells you to stop. Talk to your health care provider about the use of this drug in children. Special care may be needed. Overdosage: If you think you have taken too much of this medicine contact a poison control center or emergency room at once. NOTE: This medicine is only for you. Do not share this medicine with others. What if I miss a dose? If you miss a dose, take it as soon as you can. If it is almost time for your next dose, take only that dose. Do not take double or extra doses. What may interact with this medicine?  aspirin and aspirin-like medicines  certain antibiotics  chloral hydrate  cisplatin  cyclosporine  digoxin  diuretics  laxatives  lithium  medicines for blood pressure  medicines that relax muscles for surgery  methotrexate  NSAIDs, medicines for pain and inflammation like ibuprofen, naproxen, or indomethacin  phenytoin  steroid medicines like prednisone or cortisone  sucralfate  thyroid hormones This list may not describe all possible interactions. Give your health  care provider a list of all the medicines, herbs, non-prescription drugs, or dietary supplements you use. Also tell them if you smoke, drink alcohol, or use illegal drugs. Some items may interact with your medicine. What should I watch for while using this medicine? Visit your doctor or health care provider for regular checks on your progress. Check your blood pressure regularly. Ask your doctor or health care provider what your blood pressure should be, and when you should contact him or her. If you are a diabetic, check your blood sugar as directed. This medicine may cause serious skin reactions. They can happen weeks to months after starting the medicine. Contact your health care  provider right away if you notice fevers or flu-like symptoms with a rash. The rash may be red or purple and then turn into blisters or peeling of the skin. Or, you might notice a red rash with swelling of the face, lips or lymph nodes in your neck or under your arms. You may need to be on a special diet while taking this medicine. Check with your doctor. Also, ask how many glasses of fluid you need to drink a day. You must not get dehydrated. You may get drowsy or dizzy. Do not drive, use machinery, or do anything that needs mental alertness until you know how this drug affects you. Do not stand or sit up quickly, especially if you are an older patient. This reduces the risk of dizzy or fainting spells. Alcohol can make you more drowsy and dizzy. Avoid alcoholic drinks. This medicine can make you more sensitive to the sun. Keep out of the sun. If you cannot avoid being in the sun, wear protective clothing and use sunscreen. Do not use sun lamps or tanning beds/booths. What side effects may I notice from receiving this medicine? Side effects that you should report to your doctor or health care professional as soon as possible:  blood in urine or stools  dry mouth  fever or chills  hearing loss or ringing in the ears  irregular heartbeat  muscle pain or weakness, cramps  rash, fever, and swollen lymph nodes  redness, blistering, peeling or loosening of the skin, including inside the mouth  skin rash  stomach upset, pain, or nausea  tingling or numbness in the hands or feet  unusually weak or tired  vomiting or diarrhea  yellowing of the eyes or skin Side effects that usually do not require medical attention (report to your doctor or health care professional if they continue or are bothersome):  headache  loss of appetite  unusual bleeding or bruising This list may not describe all possible side effects. Call your doctor for medical advice about side effects. You may report  side effects to FDA at 1-800-FDA-1088. Where should I keep my medicine? Keep out of the reach of children and pets. Store at room temperature between 20 and 25 degrees C (68 and 77 degrees F). Protect from light and moisture. Keep the container tightly closed. Throw away any unused drug after the expiration date. NOTE: This sheet is a summary. It may not cover all possible information. If you have questions about this medicine, talk to your doctor, pharmacist, or health care provider.  2020 Elsevier/Gold Standard (2019-05-26 18:01:32)  Pulmonary Function Tests Pulmonary function tests (PFTs) are used to measure how well your lungs work, find out what is causing your lung problems, and figure out the best treatment for you. You may have PFTs:  When you have an  illness involving the lungs.  To follow changes in your lung function over time if you have a chronic lung disease.  If you are an Nature conservation officer. This checks the effects of being exposed to chemicals over a long period of time.  To check lung function before having surgery or other procedures.  To check your lungs if you smoke.  To check if prescribed medicines or treatments are helping your lungs. Your results will be compared to the expected lung function of someone with healthy lungs who is similar to you in:  Age.  Gender.  Height.  Weight.  Race or ethnicity. This is done to show how your lungs compare to normal lung function (percent predicted). This is how your health care provider knows if your lung function is normal or not. If you have had PFTs done before, your health care provider will compare your current results with past results. This shows if your lung function is better, worse, or the same as before. Tell a health care provider about:  Any allergies you have.  All medicines you are taking, including inhaler or nebulizer medicines, vitamins, herbs, eye drops, creams, and over-the-counter  medicines.  Any blood disorders you have.  Any surgeries you have had, especially recent eye surgery, abdominal surgery, or chest surgery. These can make PFTs difficult or unsafe.  Any medical conditions you have, including chest pain or heart problems, tuberculosis, or respiratory infections such as pneumonia, a cold, or the flu.  Any fear of being in closed spaces (claustrophobia). Some of your tests may be in a closed space. What are the risks? Generally, this is a safe procedure. However, problems may occur, including:  Light-headedness due to over-breathing (hyperventilation).  An asthma attack from deep breathing.  A collapsed lung. What happens before the procedure?  Take over-the-counter and prescription medicines only as told by your health care provider. If you take inhaler or nebulizer medicines, ask your health care provider which medicines you should take on the day of your testing. Some inhaler medicines may interfere with PFTs if they are taken shortly before the tests.  Follow your health care provider's instructions on eating and drinking restrictions. This may include avoiding eating large meals and drinking alcohol before the testing.  Do not use any products that contain nicotine or tobacco, such as cigarettes and e-cigarettes. If you need help quitting, ask your health care provider.  Wear comfortable clothing that will not interfere with breathing. What happens during the procedure?   You will be given a soft nose clip to wear. This is done so all of your breaths will go through your mouth instead of your nose.  You will be given a germ-free (sterile) mouthpiece. It will be attached to a machine that measures your breathing (spirometer).  You will be asked to do various breathing maneuvers. The maneuvers will be done by breathing in (inhaling) and breathing out (exhaling). You may be asked to repeat the maneuvers several times before the testing is done.  It  is important to follow the instructions exactly to get accurate results. Make sure to blow as hard and as fast as you can when you are told to do so.  You may be given a medicine that makes the small air passages in your lungs larger (bronchodilator) after testing has been done. This medicine will make it easier for you to breathe.  The tests will be repeated after the bronchodilator has taken effect.  You will be monitored  carefully during the procedure for faintness, dizziness, trouble breathing, or any other problems. The procedure may vary among health care providers and hospitals. What happens after the procedure?  It is up to you to get your test results. Ask your health care provider, or the department that is doing the tests, when your results will be ready. After you have received your test results, talk with your health care provider about treatment options, if necessary. Summary  Pulmonary function tests (PFTs) are used to measure how well your lungs work, find out what is causing your lung problems, and figure out the best treatment for you.  Wear comfortable clothing that will not interfere with breathing.  It is up to you to get your test results. After you have received them, talk with your health care provider about treatment options, if necessary. This information is not intended to replace advice given to you by your health care provider. Make sure you discuss any questions you have with your health care provider. Document Revised: 10/05/2016 Document Reviewed: 08/30/2016 Elsevier Patient Education  [employer].      Adopting a Healthy Lifestyle.  Know what a healthy weight is for you (roughly BMI <25) and aim to maintain this   Aim for 7+ servings of fruits and vegetables daily   65-80+ fluid ounces of water or unsweet tea for healthy kidneys   Limit to max 1 drink of alcohol per day; avoid smoking/tobacco   Limit animal fats in diet for cholesterol and  heart health - choose grass fed whenever available   Avoid highly processed foods, and foods high in saturated/trans fats   Aim for low stress - take time to unwind and care for your mental health   Aim for 150 min of moderate intensity exercise weekly for heart health, and weights twice weekly for bone health   Aim for 7-9 hours of sleep daily   When it comes to diets, agreement about the perfect plan isnt easy to find, even among the experts. Experts at the [employer] developed an idea known as the Healthy Eating Plate. Just imagine a plate divided into logical, healthy portions.   The emphasis is on diet quality:   Load up on vegetables and fruits - one-half of your plate: Aim for color and variety, and remember that potatoes dont count.   Go for whole grains - one-quarter of your plate: Whole wheat, barley, wheat berries, quinoa, oats, brown rice, and foods made with them. If you want pasta, go with whole wheat pasta.   Protein power - one-quarter of your plate: Fish, chicken, beans, and nuts are all healthy, versatile protein sources. Limit red meat.   The diet, however, does go beyond the plate, offering a few other suggestions.   Use healthy plant oils, such as olive, canola, soy, corn, sunflower and peanut. Check the labels, and avoid partially hydrogenated oil, which have unhealthy trans fats.   If youre thirsty, drink water. Coffee and tea are good in moderation, but skip sugary drinks and limit milk and dairy products to one or two daily servings.   The type of carbohydrate in the diet is more important than the amount. Some sources of carbohydrates, such as vegetables, fruits, whole grains, and beans-are healthier than others.   Finally, stay active  Signed, Berniece Salines, DO  09/28/2020 2:52 PM    [place]

## 2020-09-28 NOTE — Patient Instructions (Signed)
Medication Instructions:  Your physician has recommended you make the following change in your medication:   Start lasix 20 mg daily. Take KCL 20 mEq daily.  *If you need a refill on your cardiac medications before your next appointment, please call your pharmacy*   Lab Work: None ordered If you have labs (blood work) drawn today and your tests are completely normal, you will receive your results only by: Marland Kitchen MyChart Message (if you have MyChart) OR . A paper copy in the mail If you have any lab test that is abnormal or we need to change your treatment, we will call you to review the results.   Testing/Procedures: Your physician has recommended that you have a pulmonary function test. Pulmonary Function Tests are a group of tests that measure how well air moves in and out of your lungs.   WHY IS MY DOCTOR PRESCRIBING ZIO? The Zio system is proven and trusted by physicians to detect and diagnose irregular heart rhythms -- and has been prescribed to hundreds of thousands of patients.  The FDA has cleared the Zio system to monitor for many different kinds of irregular heart rhythms. In a study, physicians were able to reach a diagnosis 90% of the time with the Zio system1.  You can wear the Zio monitor -- a small, discreet, comfortable patch -- during your normal day-to-day activity, including while you sleep, shower, and exercise, while it records every single heartbeat for analysis.  1Barrett, P., et al. Comparison of 24 Hour Holter Monitoring Versus 14 Day Novel Adhesive Patch Electrocardiographic Monitoring. [place], 2014.  ZIO VS. HOLTER MONITORING The Zio monitor can be comfortably worn for up to 14 days. Holter monitors can be worn for 24 to 48 hours, limiting the time to record any irregular heart rhythms you may have. Zio is able to capture data for the 51% of patients who have their first symptom-triggered arrhythmia after 48 hours.1  LIVE WITHOUT  RESTRICTIONS The Zio ambulatory cardiac monitor is a small, unobtrusive, and water-resistant patch--you might even forget you're wearing it. The Zio monitor records and stores every beat of your heart, whether you're sleeping, working out, or showering. Wear the monitor for 3 days, remove 10/01/20 after 3:00 pm.    Follow-Up: At [facility], you and your health needs are our priority.  As part of our continuing mission to provide you with exceptional heart care, we have created designated Provider Care Teams.  These Care Teams include your primary Cardiologist (physician) and Advanced Practice Providers (APPs -  Physician Assistants and Nurse Practitioners) who all work together to provide you with the care you need, when you need it.  We recommend signing up for the patient portal called "MyChart".  Sign up information is provided on this After Visit Summary.  MyChart is used to connect with patients for Virtual Visits (Telemedicine).  Patients are able to view lab/test results, encounter notes, upcoming appointments, etc.  Non-urgent messages can be sent to your provider as well.   To learn more about what you can do with MyChart, go to [email or web address].    Your next appointment:   3 week(s)  The format for your next appointment:   In Person  Provider:   Berniece Salines, DO   Other Instructions Potassium Chloride Extended-Release Capsules What is this medicine? POTASSIUM CHLORIDE (poe TASS i um KLOOR ide) is a potassium supplement used to prevent and to treat low potassium. Potassium is important for the heart, muscles, and nerves.  Too much or too little potassium in the body can cause serious problems. This medicine may be used for other purposes; ask your health care provider or pharmacist if you have questions. COMMON BRAND NAME(S): Klor-Con, Micro-K, Micro-K Extencaps What should I tell my health care provider before I take this medicine? They need to know if you have any of  these conditions:  Addison disease  dehydration  diabetes, high blood sugar  difficulty swallowing  heart disease  high levels of potassium in the blood  irregular heartbeat or rhythm  kidney disease  large areas of burned skin  stomach ulcers, other stomach or intestine problems  an unusual or allergic reaction to potassium, other medicines, foods, dyes, or preservatives  pregnant or trying to get pregnant  breast-feeding How should I use this medicine? Take this drug by mouth with a glass of water. Take it as directed on the prescription label at the same time every day. Take it with food. Do not cut, crush, chew, or suck this drug. Swallow the capsules whole. You may open the capsule and put the contents in a teaspoon of soft food, such as applesauce or pudding. Do not add to hot foods. Swallow the mixture right away. Do not chew the mixture. Drink a glass of water or juice after taking the mixture. Keep taking this medicine unless your health care provider tells you to stop. Talk to your health care provider about the use of this drug in children. Special care may be needed. Overdosage: If you think you have taken too much of this medicine contact a poison control center or emergency room at once. NOTE: This medicine is only for you. Do not share this medicine with others. What if I miss a dose? If you miss a dose, take it as soon as you can. If it is almost time for your next dose, take only that dose. Do not take double or extra doses. What may interact with this medicine? Do not take this medicine with any of the following medications:  certain diuretics such as spironolactone, triamterene  certain medicines for stomach problems like atropine; difenoxin and glycopyrrolate  eplerenone  sodium polystyrene sulfonate This medicine may also interact with the following medications:  certain medicines for blood pressure or heart disease like lisinopril, losartan,  quinapril, valsartan  medicines that lower your chance of fighting infection such as cyclosporine, tacrolimus  NSAIDs, medicines for pain and inflammation, like ibuprofen or naproxen  other potassium supplements  salt substitutes This list may not describe all possible interactions. Give your health care provider a list of all the medicines, herbs, non-prescription drugs, or dietary supplements you use. Also tell them if you smoke, drink alcohol, or use illegal drugs. Some items may interact with your medicine. What should I watch for while using this medicine? Visit your doctor or health care professional for regular check ups. You will need lab work done regularly. You may need to be on a special diet while taking this medicine. Ask your doctor. What side effects may I notice from receiving this medicine? Side effects that you should report to your doctor or health care professional as soon as possible:  allergic reactions like skin rash, itching or hives, swelling of the face, lips, or tongue  black, tarry stools  breathing problems  confusion  heartburn  fast, irregular heartbeat  feeling faint or lightheaded, falls  low blood pressure  numbness or tingling in hands or feet  pain when swallowing  unusually  weak or tired  weakness, heaviness of legs Side effects that usually do not require medical attention (report to your doctor or health care professional if they continue or are bothersome):  diarrhea  nausea, vomiting  stomach pain This list may not describe all possible side effects. Call your doctor for medical advice about side effects. You may report side effects to FDA at 1-800-FDA-1088. Where should I keep my medicine? Keep out of the reach of children. Store at room temperature between 15 and 30 degrees C (59 and 86 degrees F ). Keep bottle closed tightly to protect this medicine from light and moisture. Throw away any unused medicine after the expiration  date. NOTE: This sheet is a summary. It may not cover all possible information. If you have questions about this medicine, talk to your doctor, pharmacist, or health care provider.  2020 Elsevier/Gold Standard (2019-08-04 16:43:28) Furosemide Oral Tablets What is this medicine? FUROSEMIDE (fyoor [place] se mide) is a diuretic. It helps you make more urine and to lose salt and excess water from your body. It treats swelling from heart, kidney, or liver disease. It also treats high blood pressure. This medicine may be used for other purposes; ask your health care provider or pharmacist if you have questions. COMMON BRAND NAME(S): Active-Medicated Specimen Kit, Delone, Diuscreen, Lasix, RX Specimen Collection Kit, Specimen Collection Kit, URINX Medicated Specimen Collection What should I tell my health care provider before I take this medicine? They need to know if you have any of these conditions:  abnormal blood electrolytes  diarrhea or vomiting  gout  heart disease  kidney disease, small amounts of urine, or difficulty passing urine  liver disease  thyroid disease  an unusual or allergic reaction to furosemide, sulfa drugs, other medicines, foods, dyes, or preservatives  pregnant or trying to get pregnant  breast-feeding How should I use this medicine? Take this drug by mouth. Take it as directed on the prescription label at the same time every day. You can take it with or without food. If it upsets your stomach, take it with food. Keep taking it unless your health care provider tells you to stop. Talk to your health care provider about the use of this drug in children. Special care may be needed. Overdosage: If you think you have taken too much of this medicine contact a poison control center or emergency room at once. NOTE: This medicine is only for you. Do not share this medicine with others. What if I miss a dose? If you miss a dose, take it as soon as you can. If it is almost  time for your next dose, take only that dose. Do not take double or extra doses. What may interact with this medicine?  aspirin and aspirin-like medicines  certain antibiotics  chloral hydrate  cisplatin  cyclosporine  digoxin  diuretics  laxatives  lithium  medicines for blood pressure  medicines that relax muscles for surgery  methotrexate  NSAIDs, medicines for pain and inflammation like ibuprofen, naproxen, or indomethacin  phenytoin  steroid medicines like prednisone or cortisone  sucralfate  thyroid hormones This list may not describe all possible interactions. Give your health care provider a list of all the medicines, herbs, non-prescription drugs, or dietary supplements you use. Also tell them if you smoke, drink alcohol, or use illegal drugs. Some items may interact with your medicine. What should I watch for while using this medicine? Visit your doctor or health care provider for regular checks on your  progress. Check your blood pressure regularly. Ask your doctor or health care provider what your blood pressure should be, and when you should contact him or her. If you are a diabetic, check your blood sugar as directed. This medicine may cause serious skin reactions. They can happen weeks to months after starting the medicine. Contact your health care provider right away if you notice fevers or flu-like symptoms with a rash. The rash may be red or purple and then turn into blisters or peeling of the skin. Or, you might notice a red rash with swelling of the face, lips or lymph nodes in your neck or under your arms. You may need to be on a special diet while taking this medicine. Check with your doctor. Also, ask how many glasses of fluid you need to drink a day. You must not get dehydrated. You may get drowsy or dizzy. Do not drive, use machinery, or do anything that needs mental alertness until you know how this drug affects you. Do not stand or sit up quickly,  especially if you are an older patient. This reduces the risk of dizzy or fainting spells. Alcohol can make you more drowsy and dizzy. Avoid alcoholic drinks. This medicine can make you more sensitive to the sun. Keep out of the sun. If you cannot avoid being in the sun, wear protective clothing and use sunscreen. Do not use sun lamps or tanning beds/booths. What side effects may I notice from receiving this medicine? Side effects that you should report to your doctor or health care professional as soon as possible:  blood in urine or stools  dry mouth  fever or chills  hearing loss or ringing in the ears  irregular heartbeat  muscle pain or weakness, cramps  rash, fever, and swollen lymph nodes  redness, blistering, peeling or loosening of the skin, including inside the mouth  skin rash  stomach upset, pain, or nausea  tingling or numbness in the hands or feet  unusually weak or tired  vomiting or diarrhea  yellowing of the eyes or skin Side effects that usually do not require medical attention (report to your doctor or health care professional if they continue or are bothersome):  headache  loss of appetite  unusual bleeding or bruising This list may not describe all possible side effects. Call your doctor for medical advice about side effects. You may report side effects to FDA at 1-800-FDA-1088. Where should I keep my medicine? Keep out of the reach of children and pets. Store at room temperature between 20 and 25 degrees C (68 and 77 degrees F). Protect from light and moisture. Keep the container tightly closed. Throw away any unused drug after the expiration date. NOTE: This sheet is a summary. It may not cover all possible information. If you have questions about this medicine, talk to your doctor, pharmacist, or health care provider.  2020 Elsevier/Gold Standard (2019-05-26 18:01:32)  Pulmonary Function Tests Pulmonary function tests (PFTs) are used to measure  how well your lungs work, find out what is causing your lung problems, and figure out the best treatment for you. You may have PFTs:  When you have an illness involving the lungs.  To follow changes in your lung function over time if you have a chronic lung disease.  If you are an Nature conservation officer. This checks the effects of being exposed to chemicals over a long period of time.  To check lung function before having surgery or other procedures.  To check your lungs if you smoke.  To check if prescribed medicines or treatments are helping your lungs. Your results will be compared to the expected lung function of someone with healthy lungs who is similar to you in:  Age.  Gender.  Height.  Weight.  Race or ethnicity. This is done to show how your lungs compare to normal lung function (percent predicted). This is how your health care provider knows if your lung function is normal or not. If you have had PFTs done before, your health care provider will compare your current results with past results. This shows if your lung function is better, worse, or the same as before. Tell a health care provider about:  Any allergies you have.  All medicines you are taking, including inhaler or nebulizer medicines, vitamins, herbs, eye drops, creams, and over-the-counter medicines.  Any blood disorders you have.  Any surgeries you have had, especially recent eye surgery, abdominal surgery, or chest surgery. These can make PFTs difficult or unsafe.  Any medical conditions you have, including chest pain or heart problems, tuberculosis, or respiratory infections such as pneumonia, a cold, or the flu.  Any fear of being in closed spaces (claustrophobia). Some of your tests may be in a closed space. What are the risks? Generally, this is a safe procedure. However, problems may occur, including:  Light-headedness due to over-breathing (hyperventilation).  An asthma attack from deep  breathing.  A collapsed lung. What happens before the procedure?  Take over-the-counter and prescription medicines only as told by your health care provider. If you take inhaler or nebulizer medicines, ask your health care provider which medicines you should take on the day of your testing. Some inhaler medicines may interfere with PFTs if they are taken shortly before the tests.  Follow your health care provider's instructions on eating and drinking restrictions. This may include avoiding eating large meals and drinking alcohol before the testing.  Do not use any products that contain nicotine or tobacco, such as cigarettes and e-cigarettes. If you need help quitting, ask your health care provider.  Wear comfortable clothing that will not interfere with breathing. What happens during the procedure?   You will be given a soft nose clip to wear. This is done so all of your breaths will go through your mouth instead of your nose.  You will be given a germ-free (sterile) mouthpiece. It will be attached to a machine that measures your breathing (spirometer).  You will be asked to do various breathing maneuvers. The maneuvers will be done by breathing in (inhaling) and breathing out (exhaling). You may be asked to repeat the maneuvers several times before the testing is done.  It is important to follow the instructions exactly to get accurate results. Make sure to blow as hard and as fast as you can when you are told to do so.  You may be given a medicine that makes the small air passages in your lungs larger (bronchodilator) after testing has been done. This medicine will make it easier for you to breathe.  The tests will be repeated after the bronchodilator has taken effect.  You will be monitored carefully during the procedure for faintness, dizziness, trouble breathing, or any other problems. The procedure may vary among health care providers and hospitals. What happens after the  procedure?  It is up to you to get your test results. Ask your health care provider, or the department that is doing the tests, when your results will  be ready. After you have received your test results, talk with your health care provider about treatment options, if necessary. Summary  Pulmonary function tests (PFTs) are used to measure how well your lungs work, find out what is causing your lung problems, and figure out the best treatment for you.  Wear comfortable clothing that will not interfere with breathing.  It is up to you to get your test results. After you have received them, talk with your health care provider about treatment options, if necessary. This information is not intended to replace advice given to you by your health care provider. Make sure you discuss any questions you have with your health care provider. Document Revised: 10/05/2016 Document Reviewed: 08/30/2016 Elsevier Patient Education  2020 [employer].

## 2020-09-29 ENCOUNTER — Telehealth: Payer: Self-pay | Admitting: Cardiology

## 2020-09-29 MED ORDER — POTASSIUM CHLORIDE 20 MEQ PO PACK: 20.0000 meq | PACK | Freq: Every day | ORAL | 3 refills | Status: DC

## 2020-09-29 NOTE — Telephone Encounter (Signed)
Spoke to the patient just now and let her know Dr. Tobb's recommendations. She verbalizes understanding and thanks me for the call back.  

## 2020-09-29 NOTE — Addendum Note (Signed)
Addended by: Resa Miner I on: 09/29/2020 03:45 PM   Modules accepted: Orders

## 2020-09-29 NOTE — Telephone Encounter (Signed)
Based on your clinical exam I do think that you had some crackles which could be the reason why you are short of breath that is why we started the Lasix.  I would recommend you take it for at least 2 weeks to make sure that we get you feeling better.  Yes getting rid of the fluid means that you are going to urinate a lot but it is worth it to make you feel better with the breathing. I can send you some liquid potassium or powder potassium.

## 2020-09-29 NOTE — Telephone Encounter (Signed)
New Message:    Pt c/o medication issue:  1. Name of Medication:  Furosemide and Potassium  2. How are you currently taking this medication (dosage and times per day)? 1 time a day  3. Are you having a reaction (difficulty breathing--STAT)? No  4. What is your medication issue? Says she can not take these medicine- Potassium pill unable to swallow and Furosemide- make her urinate too much , she already have a bladder problem*

## 2020-10-04 ENCOUNTER — Telehealth: Payer: Self-pay | Admitting: Cardiology

## 2020-10-04 DIAGNOSIS — R0609 Other forms of dyspnea: Secondary | ICD-10-CM | POA: Diagnosis not present

## 2020-10-04 LAB — PULMONARY FUNCTION TEST

## 2020-10-04 NOTE — Telephone Encounter (Signed)
Pt c/o Shortness Of Breath: STAT if SOB developed within the last 24 hours or pt is noticeably SOB on the phone  1. Are you currently SOB (can you hear that pt is SOB on the phone)? Daughter lives in [place] and is calling for her mom.  2. How long have you been experiencing SOB? Has been ongoing  3. Are you SOB when sitting or when up moving around? Both  4. Are you currently experiencing any other symptoms? Yes, daughter states she is having bad shortness of breath this morning.   Patient is scheduled for a test at [facility] at [place] today. Has been on lasix since she left last appointment and is not using the bathroom as much as normal. Daughter wants to be called with her pulmonary test results.

## 2020-10-04 NOTE — Telephone Encounter (Signed)
Report is not available at this time.

## 2020-10-04 NOTE — Telephone Encounter (Signed)
Could you please get the information from [facility] I will be happy to review it.

## 2020-10-06 ENCOUNTER — Encounter (HOSPITAL_COMMUNITY): Payer: Self-pay | Admitting: Emergency Medicine

## 2020-10-06 ENCOUNTER — Emergency Department (HOSPITAL_COMMUNITY): Payer: Medicare Other

## 2020-10-06 ENCOUNTER — Emergency Department (HOSPITAL_COMMUNITY)
Admission: EM | Admit: 2020-10-06 | Discharge: 2020-10-06 | Disposition: A | Discharge status: Home / Self Care | Payer: Medicare Other | Attending: Emergency Medicine | Admitting: Emergency Medicine

## 2020-10-06 ENCOUNTER — Other Ambulatory Visit: Payer: Self-pay

## 2020-10-06 DIAGNOSIS — R0602 Shortness of breath: Secondary | ICD-10-CM | POA: Insufficient documentation

## 2020-10-06 DIAGNOSIS — J449 Chronic obstructive pulmonary disease, unspecified: Secondary | ICD-10-CM | POA: Diagnosis not present

## 2020-10-06 DIAGNOSIS — J90 Pleural effusion, not elsewhere classified: Secondary | ICD-10-CM | POA: Diagnosis not present

## 2020-10-06 DIAGNOSIS — Z5321 Procedure and treatment not carried out due to patient leaving prior to being seen by health care provider: Secondary | ICD-10-CM | POA: Insufficient documentation

## 2020-10-06 LAB — BASIC METABOLIC PANEL
Anion gap: 16 — ABNORMAL HIGH (ref 5–15)
BUN: 22 mg/dL (ref 8–23)
CO2: 22 mmol/L (ref 22–32)
Calcium: 9.5 mg/dL (ref 8.9–10.3)
Chloride: 101 mmol/L (ref 98–111)
Creatinine, Ser: 1.33 mg/dL — ABNORMAL HIGH (ref 0.44–1.00)
GFR, Estimated: 43 mL/min — ABNORMAL LOW (ref >60)
Glucose, Bld: 190 mg/dL — ABNORMAL HIGH (ref 70–99)
Potassium: 3.7 mmol/L (ref 3.5–5.1)
Sodium: 139 mmol/L (ref 135–145)

## 2020-10-06 LAB — CBC
HCT: 40.2 % (ref 36.0–46.0)
Hemoglobin: 13.7 g/dL (ref 12.0–15.0)
MCH: 31.2 pg (ref 26.0–34.0)
MCHC: 34.1 g/dL (ref 30.0–36.0)
MCV: 91.6 fL (ref 80.0–100.0)
Platelets: 328 10*3/uL (ref 150–400)
RBC: 4.39 MIL/uL (ref 3.87–5.11)
RDW: 13.3 % (ref 11.5–15.5)
WBC: 6.3 10*3/uL (ref 4.0–10.5)
nRBC: 0 % (ref 0.0–0.2)

## 2020-10-06 MED ORDER — ALBUTEROL SULFATE HFA 108 (90 BASE) MCG/ACT IN AERS: 2.0000 | INHALATION_SPRAY | RESPIRATORY_TRACT | Status: DC | PRN

## 2020-10-06 NOTE — ED Notes (Signed)
Husband [phone] call when patient gets in a room

## 2020-10-06 NOTE — ED Triage Notes (Signed)
Pt arrives with c/o worsening sob for the past day, states she has had sob x3 weeks, saw pcp and was sent for LFTs a few days ago but has not heard results of tests. Pt denies cp, edema or recent sick contacts. A/ox4, unable to speak in full sentences due to sob.

## 2020-10-06 NOTE — Telephone Encounter (Signed)
Pt c/o Shortness Of Breath: STAT if SOB developed within the last 24 hours or pt is noticeably SOB on the phone  1. Are you currently SOB (can you hear that pt is SOB on the phone)? Yes   2. How long have you been experiencing SOB? Has been ongoing   3. Are you SOB when sitting or when up moving around?  Both   4. Are you currently experiencing any other symptoms?  Just really short of breath

## 2020-10-06 NOTE — Telephone Encounter (Signed)
Reports worsening SOB. Denies chest pain or dizziness. Sounds SOB on the phone. Advised to go the ED now for an evaluation. Verbalized understanding

## 2020-10-06 NOTE — ED Notes (Signed)
Patient informed ED staff she was leaving. Informed pt that we wanted her to stay and get seen by EDP. Pt stated she was leaving

## 2020-10-10 ENCOUNTER — Telehealth: Payer: Self-pay

## 2020-10-10 DIAGNOSIS — I10 Essential (primary) hypertension: Secondary | ICD-10-CM | POA: Insufficient documentation

## 2020-10-10 DIAGNOSIS — E039 Hypothyroidism, unspecified: Secondary | ICD-10-CM | POA: Insufficient documentation

## 2020-10-10 NOTE — Telephone Encounter (Signed)
-----   Message from Berniece Salines, DO sent at 10/10/2020  4:50 PM EST ----- Your monitor was essentially normal, no evidence of any arrhythmia.

## 2020-10-10 NOTE — Telephone Encounter (Signed)
Patient notified of results and verbalized understanding. Patient mention she is having difficulty breathing  But her daughter has spoken with Dr. Harriet Masson and has recommend she see a pulmonologist in [facility] which patient will arranging this soon.

## 2020-10-12 ENCOUNTER — Encounter (HOSPITAL_BASED_OUTPATIENT_CLINIC_OR_DEPARTMENT_OTHER): Payer: Self-pay | Admitting: *Deleted

## 2020-10-12 ENCOUNTER — Ambulatory Visit (INDEPENDENT_AMBULATORY_CARE_PROVIDER_SITE_OTHER): Payer: Medicare Other | Admitting: Cardiology

## 2020-10-12 ENCOUNTER — Emergency Department (HOSPITAL_BASED_OUTPATIENT_CLINIC_OR_DEPARTMENT_OTHER)
Admission: EM | Admit: 2020-10-12 | Discharge: 2020-10-12 | Disposition: A | Discharge status: Home / Self Care | Payer: Medicare Other | Attending: Emergency Medicine | Admitting: Emergency Medicine

## 2020-10-12 ENCOUNTER — Emergency Department (HOSPITAL_BASED_OUTPATIENT_CLINIC_OR_DEPARTMENT_OTHER): Payer: Medicare Other

## 2020-10-12 ENCOUNTER — Other Ambulatory Visit: Payer: Self-pay

## 2020-10-12 ENCOUNTER — Encounter: Payer: Self-pay | Admitting: Cardiology

## 2020-10-12 VITALS — BP 110/48 | HR 85 | Ht 67.0 in | Wt 159.0 lb

## 2020-10-12 DIAGNOSIS — Z87891 Personal history of nicotine dependence: Secondary | ICD-10-CM | POA: Insufficient documentation

## 2020-10-12 DIAGNOSIS — R0602 Shortness of breath: Secondary | ICD-10-CM | POA: Diagnosis not present

## 2020-10-12 DIAGNOSIS — E1022 Type 1 diabetes mellitus with diabetic chronic kidney disease: Secondary | ICD-10-CM | POA: Insufficient documentation

## 2020-10-12 DIAGNOSIS — Z794 Long term (current) use of insulin: Secondary | ICD-10-CM | POA: Insufficient documentation

## 2020-10-12 DIAGNOSIS — J449 Chronic obstructive pulmonary disease, unspecified: Secondary | ICD-10-CM | POA: Diagnosis not present

## 2020-10-12 DIAGNOSIS — E039 Hypothyroidism, unspecified: Secondary | ICD-10-CM | POA: Insufficient documentation

## 2020-10-12 DIAGNOSIS — N183 Chronic kidney disease, stage 3 unspecified: Secondary | ICD-10-CM | POA: Insufficient documentation

## 2020-10-12 DIAGNOSIS — R942 Abnormal results of pulmonary function studies: Secondary | ICD-10-CM | POA: Diagnosis not present

## 2020-10-12 DIAGNOSIS — I10 Essential (primary) hypertension: Secondary | ICD-10-CM | POA: Diagnosis not present

## 2020-10-12 DIAGNOSIS — I5189 Other ill-defined heart diseases: Secondary | ICD-10-CM

## 2020-10-12 DIAGNOSIS — I129 Hypertensive chronic kidney disease with stage 1 through stage 4 chronic kidney disease, or unspecified chronic kidney disease: Secondary | ICD-10-CM | POA: Insufficient documentation

## 2020-10-12 DIAGNOSIS — Z79899 Other long term (current) drug therapy: Secondary | ICD-10-CM | POA: Insufficient documentation

## 2020-10-12 DIAGNOSIS — E860 Dehydration: Secondary | ICD-10-CM | POA: Diagnosis not present

## 2020-10-12 LAB — CBC WITH DIFFERENTIAL/PLATELET
Abs Immature Granulocytes: 0.01 10*3/uL (ref 0.00–0.07)
Basophils Absolute: 0.1 10*3/uL (ref 0.0–0.1)
Basophils Relative: 1 %
Eosinophils Absolute: 0.4 10*3/uL (ref 0.0–0.5)
Eosinophils Relative: 6 %
HCT: 39.1 % (ref 36.0–46.0)
Hemoglobin: 13.4 g/dL (ref 12.0–15.0)
Immature Granulocytes: 0 %
Lymphocytes Relative: 24 %
Lymphs Abs: 1.9 10*3/uL (ref 0.7–4.0)
MCH: 31.2 pg (ref 26.0–34.0)
MCHC: 34.3 g/dL (ref 30.0–36.0)
MCV: 91.1 fL (ref 80.0–100.0)
Monocytes Absolute: 1 10*3/uL (ref 0.1–1.0)
Monocytes Relative: 13 %
Neutro Abs: 4.3 10*3/uL (ref 1.7–7.7)
Neutrophils Relative %: 56 %
Platelets: 304 10*3/uL (ref 150–400)
RBC: 4.29 MIL/uL (ref 3.87–5.11)
RDW: 13.1 % (ref 11.5–15.5)
WBC: 7.7 10*3/uL (ref 4.0–10.5)
nRBC: 0 % (ref 0.0–0.2)

## 2020-10-12 LAB — COMPREHENSIVE METABOLIC PANEL
ALT: 13 U/L (ref 0–44)
AST: 19 U/L (ref 15–41)
Albumin: 3.8 g/dL (ref 3.5–5.0)
Alkaline Phosphatase: 68 U/L (ref 38–126)
Anion gap: 12 (ref 5–15)
BUN: 32 mg/dL — ABNORMAL HIGH (ref 8–23)
CO2: 25 mmol/L (ref 22–32)
Calcium: 9.5 mg/dL (ref 8.9–10.3)
Chloride: 99 mmol/L (ref 98–111)
Creatinine, Ser: 1.63 mg/dL — ABNORMAL HIGH (ref 0.44–1.00)
GFR, Estimated: 33 mL/min — ABNORMAL LOW (ref >60)
Glucose, Bld: 112 mg/dL — ABNORMAL HIGH (ref 70–99)
Potassium: 3.7 mmol/L (ref 3.5–5.1)
Sodium: 136 mmol/L (ref 135–145)
Total Bilirubin: 0.4 mg/dL (ref 0.3–1.2)
Total Protein: 7.3 g/dL (ref 6.5–8.1)

## 2020-10-12 LAB — TROPONIN I (HIGH SENSITIVITY): Troponin I (High Sensitivity): 3 ng/L (ref <18)

## 2020-10-12 LAB — BRAIN NATRIURETIC PEPTIDE: B Natriuretic Peptide: 20.1 pg/mL (ref 0.0–100.0)

## 2020-10-12 MED ORDER — SODIUM CHLORIDE 0.9 % IV BOLUS
1000.0000 mL | Freq: Once | INTRAVENOUS | Status: AC
  Administered 2020-10-12: 16:00:00 1000 mL via INTRAVENOUS

## 2020-10-12 MED ORDER — ALBUTEROL SULFATE HFA 108 (90 BASE) MCG/ACT IN AERS
2.0000 | INHALATION_SPRAY | Freq: Once | RESPIRATORY_TRACT | Status: AC
  Administered 2020-10-12: 15:00:00 2 via RESPIRATORY_TRACT
  Filled 2020-10-12: qty 6.7

## 2020-10-12 NOTE — ED Provider Notes (Signed)
[facility] EMERGENCY DEPARTMENT Provider Note   CSN: McNeil:[id number] Arrival date & time: 10/12/20  1449     History Chief Complaint  Patient presents with  . Shortness of Breath    April Long is a 74 y.o. female.  HPI 74 year old female presents with shortness of breath.  She was sent by Dr.Tobb.  Patient was apparently sent by Dr. Harriet Masson to help rule out CHF or obvious lung pathology.  The patient has been short of breath for a couple months.  Last month she was admitted to [facility] overnight with no clear cause found.  The cardiologist was hoping she would get a pulmonology referral as long as CHF was ruled out.  Patient has had dyspnea on exertion during this whole time but no significant dyspnea at rest.  No fever, cough, chest pain.  She does not feel like she has leg swelling but she was recently put on Lasix a couple weeks ago.  She is urinating more but does not feel like she is less short of breath.  Patient has a remote smoking history, no known history of COPD/emphysema but she was told on recent pulmonary function testing that it did look like she had some structural lung disease.  Past Medical History:  Diagnosis Date  . Abnormal gait 06/10/2018  . Acute pain of right wrist 08/20/2019  . Aphakia of left eye 07/19/2015  . 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 Cancer ([place])   . Carpal tunnel syndrome of right wrist 12/20/2019  . Cataract 09/26/2020  . Chronic venous insufficiency 02/05/2018  . Diabetes mellitus type 1 ([place]) 11/20/2016  . Hypercholesterolemia 11/20/2016  . Hypertension   . Hypothyroidism   . Insulin pump status 11/20/2016  . 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 Long toenail 01/02/2016  . Nondiabetic proliferative retinopathy 09/18/2011  . Osteopenia of multiple sites 11/20/2016  . Osteoporosis    early stage  . PAD (peripheral artery  disease) ([place]) 11/27/2017  . Pain in right foot 11/26/2017  . Pain of right thumb 11/04/2019  . Paresthesia of upper limb 06/24/2019  . Primary hypothyroidism 11/20/2016  . Pseudophakia of right eye 07/19/2015  . Right posterior capsular opacification 07/19/2015  . Slurring of speech 08/03/2013  . Stage 3a chronic kidney disease ([place]) 12/31/2018  . Syncope 08/03/2013  . Thyroid disease   . Traction retinal detachment 05/20/2013    Patient Active Problem List   Diagnosis Date Noted  . Hypothyroidism   . Hypertension   . Cataract 09/26/2020  . Arthritis   . Cancer ([place])   . Thyroid disease   . Carpal tunnel syndrome of right wrist 12/20/2019  . Pain of right thumb 11/04/2019  . Acute pain of right wrist 08/20/2019  . Paresthesia of upper limb 06/24/2019  . Stage 3a chronic kidney disease ([place]) 12/31/2018  . Abnormal gait 06/10/2018  . Chronic venous insufficiency 02/05/2018  . PAD (peripheral artery disease) ([place]) 11/27/2017  . Pain in right foot 11/26/2017  . Diabetes mellitus type 1 ([place]) 11/20/2016  . Hypercholesterolemia 11/20/2016  . Insulin pump status 11/20/2016  . Osteopenia of multiple sites 11/20/2016  . Primary hypothyroidism 11/20/2016  . Long toenail 01/02/2016  . Aphakia of left eye 07/19/2015  . Pseudophakia of right eye 07/19/2015  . Right posterior capsular opacification 07/19/2015  . Syncope 08/03/2013  . Slurring of speech 08/03/2013  .  Traction retinal detachment 05/20/2013  . 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 Nondiabetic proliferative retinopathy 09/18/2011    Past Surgical History:  Procedure Laterality Date  . BREAST LUMPECTOMY Left   . BREAST SURGERY  2012   lumpectomy  . EYE SURGERY  1983   retinal reattachment  . TUBAL LIGATION  1974     OB History   No obstetric history on file.     Family History  Problem Relation Age of Onset  . Alzheimer's disease Mother   .  Heart disease Father   . Heart failure Father   . Breast cancer Neg Hx     Social History   Tobacco Use  . Smoking status: Former Smoker    Quit date: 12/19/2000    Years since quitting: 19.8  . Smokeless tobacco: Never Used  Substance Use Topics  . Alcohol use: Yes    Alcohol/week: 7.0 standard drinks    Types: 7 Glasses of wine per week    Comment: 1 -2 glasses of wine daily or weekly  . Drug use: No    Home Medications Prior to Admission medications   Medication Sig Start Date End Date Taking? Authorizing Provider  atorvastatin (LIPITOR) 10 MG tablet Take 10 mg by mouth daily.    [provider]  buPROPion (WELLBUTRIN XL) 300 MG 24 hr tablet Take 1 tablet once a day for anxiety and mood 05/28/18   [provider]  busPIRone (BUSPAR) 10 MG tablet Take 10 mg by mouth daily. For anxiety    [provider]  Calcium Carbonate-Vitamin D 600-400 MG-UNIT tablet Take 1 tablet by mouth daily. With food    [provider]  co-enzyme Q-10 30 MG capsule Take 100 mg by mouth daily.     [provider]  CYANOCOBALAMIN-CAFFEINE PO [Gummy] Take as directed    [provider]  escitalopram (LEXAPRO) 20 MG tablet  02/04/15   [provider]  furosemide (LASIX) 20 MG tablet Take 1 tablet (20 mg total) by mouth daily. 09/28/20   Tobb, Kardie, DO  Insulin Aspart (NOVOLOG [place]) Inject into the skin. Insulin pump    [provider]  levothyroxine (SYNTHROID) 50 MCG tablet Take 1 tablet by mouth as directed.    [provider]  lisinopril (PRINIVIL,ZESTRIL) 10 MG tablet Take 10 mg by mouth daily.    [provider]  Multiple Vitamin (MULTIVITAMIN) tablet Take 2 tablets by mouth daily.     [provider]  nitrofurantoin, macrocrystal-monohydrate, (MACROBID) 100 MG capsule Take 100 mg by mouth 2 (two) times daily. 09/22/20   [provider]  omeprazole (PRILOSEC) 20 MG capsule omeprazole 20 mg  capsule,delayed release  TK 1 C PO 1 TIME A DAY IN THE MORNING    [provider]  oxybutynin (DITROPAN-XL) 10 MG 24 hr tablet oxybutynin chloride ER 10 mg tablet,extended release 24 hr  TK 1 T PO QD 03/21/19   [provider]  potassium chloride (KLOR-CON) 20 MEQ packet Take 20 mEq by mouth daily. 09/29/20   Tobb, Kardie, DO  VITAMIN D, CHOLECALCIFEROL, PO Take 1,000 Units by mouth.    [provider]  ZOLPIDEM TARTRATE PO Take by mouth.    [provider]    Allergies    Sulfa drugs cross reactors  Review of Systems   Review of Systems  Constitutional: Negative for fever.  Respiratory: Positive for shortness of breath.  Negative for cough.   Cardiovascular: Negative for chest pain and leg swelling.  All other systems reviewed and are negative.   Physical Exam Updated Vital Signs BP (!) 135/46   Pulse 80   Temp 97.9 F (36.6 C) (Oral)   Resp 20   Ht 5\' 7"  (1.702 m)   Wt 68 kg   SpO2 100%   BMI 23.49 kg/m   Physical Exam Vitals and nursing note reviewed.  Constitutional:      General: She is not in acute distress.    Appearance: She is well-developed and well-nourished. She is not ill-appearing or diaphoretic.  HENT:     Head: Normocephalic and atraumatic.     Right Ear: External ear normal.     Left Ear: External ear normal.     Nose: Nose normal.  Eyes:     General:        Right eye: No discharge.        Left eye: No discharge.  Cardiovascular:     Rate and Rhythm: Normal rate and regular rhythm.     Heart sounds: Normal heart sounds.  Pulmonary:     Effort: Pulmonary effort is normal. Tachypnea present. No accessory muscle usage.     Breath sounds: Normal breath sounds. No wheezing.  Abdominal:     Palpations: Abdomen is soft.     Tenderness: There is no abdominal tenderness.  Musculoskeletal:     Right lower leg: No edema.     Left lower leg: No edema.  Skin:    General: Skin is warm and dry.  Neurological:     Mental  Status: She is alert.  Psychiatric:        Mood and Affect: Mood is not anxious.     ED Results / Procedures / Treatments   Labs (all labs ordered are listed, but only abnormal results are displayed) Labs Reviewed  COMPREHENSIVE METABOLIC PANEL - Abnormal; Notable for the following components:      Result Value   Glucose, Bld 112 (*)    BUN 32 (*)    Creatinine, Ser 1.63 (*)    GFR, Estimated 33 (*)    All other components within normal limits  BRAIN NATRIURETIC PEPTIDE  CBC WITH DIFFERENTIAL/PLATELET  TROPONIN I (HIGH SENSITIVITY)    EKG EKG Interpretation  Date/Time:  Wednesday October 12 2020 15:56:40 EST Ventricular Rate:  77 PR Interval:    QRS Duration: 94 QT Interval:  412 QTC Calculation: 467 R Axis:   60 Text Interpretation: Sinus rhythm nonspecific T wave flattening is similar to Oct 06 2020 Confirmed by Sherwood Gambler [phone]) on 10/12/2020 4:06:06 PM   Radiology DG Chest Port 1 View  Result Date: 10/12/2020 CLINICAL DATA:  Shortness of breath for 1 month EXAM: PORTABLE CHEST 1 VIEW COMPARISON:  10/06/2020 FINDINGS: Lungs are hyperinflated as can be seen with COPD. No focal consolidation. No pleural effusion or pneumothorax. Heart and mediastinal contours are unremarkable. No acute osseous abnormality. IMPRESSION: No active disease. Electronically Signed   By: Kathreen Devoid   On: 10/12/2020 15:34    Procedures Procedures (including critical care time)  Medications Ordered in ED Medications  albuterol (VENTOLIN HFA) 108 (90 Base) MCG/ACT inhaler 2 puff (2 puffs Inhalation Given 10/12/20 1524)  sodium chloride 0.9 % bolus 1,000 mL (1,000 mLs Intravenous New Bag/Given 10/12/20 1600)    ED Course  I have reviewed the triage vital signs and the nursing notes.  Pertinent labs & imaging results that were  available during my care of the patient were reviewed by me and considered in my medical decision making (see chart for details).  Clinical Course as of  10/12/20 1700  Wed Oct 12, 2020  1451 Cardiology recommending pulm referral if patient is discharged. [JH]    Clinical Course User Index [JH] Luna Fuse, MD   MDM Rules/Calculators/A&P                          Patient's dyspnea is likely from lung disease given her smoking history and abnormal PFTs per patient and husband.  Here she has no hypoxia.  No increased work of breathing at rest.  Chest x-ray has been personally reviewed as well as labs and ECG.  Only significant finding is mild bump in creatinine that would be consistent with a mild acute kidney injury.  However given no vomiting, I think it is reasonable to give her IV fluids and discharge as I think the primary cause of this is the Lasix she is on.  She is showing no signs or symptoms of CHF such as orthopnea or leg swelling so I think the Lasix is probably causing the dehydration.  I have told her to stop this.  Will refer to pulmonology which seems to be part of the primary reason for the ED visit.  Will discharge with return precautions.  She was given an albuterol inhaler to try and use at home as well. Final Clinical Impression(s) / ED Diagnoses Final diagnoses:  Shortness of breath  Acute dehydration    Rx / DC Orders ED Discharge Orders         Ordered    Ambulatory referral to Pulmonology        10/12/20 1659           Sherwood Gambler, MD 10/12/20 1701

## 2020-10-12 NOTE — Discharge Instructions (Addendum)
STOP the Furosemide/Lasix. Drink plenty of fluids. You may use the albuterol inhaler 2 puffs every 4 hours as needed for shortness of breath

## 2020-10-12 NOTE — Progress Notes (Signed)
Cardiology Office Note:    Date:  10/12/2020   ID:  April Long, DOB 02-26-1946, MRN [id number]  PCP:  Philemon Kingdom, MD  Cardiologist:  Thomasene Ripple, DO  Electrophysiologist:  None   Referring MD: Philemon Kingdom, MD   " my shortness of breath is getting worse"  History of Present Illness:    April Long is a 74 y.o. female with a hx of diabetes mellitus on insulin pump, hyperlipidemia, overactive bladder, osteoporosis, depression, insomnia and OSA on CPAP is here today for a follow-up visit.    I saw the patient on September 28, 2020 at that time she was post hospitalization for shortness of breath.  During her last visit I started patient on Lasix as she did have some bilateral crackles.  I also recommended she get a PFT given her history of smoking and the severity of her symptoms to make sure that obstructive lung disease wasn't playing a role.  In the interim we've had multiple calls from the patient and her daughter reporting that she has had worsening shortness of breath.  She is visited to the emergency department but left before she was seen by a physician.  She is here today for follow-up visit to discuss her PFT results which we were finally able to get from [facility] yesterday.  Past Medical History:  Diagnosis Date  . Abnormal gait 06/10/2018  . Acute pain of right wrist 08/20/2019  . Aphakia of left eye 07/19/2015  . 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 Cancer ([facility])   . Carpal tunnel syndrome of right wrist 12/20/2019  . Cataract 09/26/2020  . Chronic venous insufficiency 02/05/2018  . Diabetes mellitus type 1 ([facility]) 11/20/2016  . Hypercholesterolemia 11/20/2016  . Hypertension   . Hypothyroidism   . Insulin pump status 11/20/2016  . 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 Long toenail 01/02/2016  . Nondiabetic proliferative retinopathy  09/18/2011  . Osteopenia of multiple sites 11/20/2016  . Osteoporosis    early stage  . PAD (peripheral artery disease) ([facility]) 11/27/2017  . Pain in right foot 11/26/2017  . Pain of right thumb 11/04/2019  . Paresthesia of upper limb 06/24/2019  . Primary hypothyroidism 11/20/2016  . Pseudophakia of right eye 07/19/2015  . Right posterior capsular opacification 07/19/2015  . Slurring of speech 08/03/2013  . Stage 3a chronic kidney disease ([facility]) 12/31/2018  . Syncope 08/03/2013  . Thyroid disease   . 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 BREAST LUMPECTOMY Left   . BREAST SURGERY  2012   lumpectomy  . EYE SURGERY  1983   retinal reattachment  . TUBAL LIGATION  1974    Current Medications: Current Meds  Medication Sig  . atorvastatin (LIPITOR) 10 MG tablet Take 10 mg by mouth daily.  Marland Kitchen buPROPion (WELLBUTRIN XL) 300 MG 24 hr tablet Take 1 tablet once a day for anxiety and mood  . busPIRone (BUSPAR) 10 MG tablet Take 10 mg by mouth daily. For anxiety  . Calcium Carbonate-Vitamin D 600-400 MG-UNIT tablet Take 1 tablet by mouth daily. With food  . co-enzyme Q-10 30 MG capsule Take 100 mg by mouth daily.   . CYANOCOBALAMIN-CAFFEINE PO [Gummy] Take as directed  . escitalopram (LEXAPRO) 20 MG tablet   . furosemide (LASIX) 20 MG tablet Take 1 tablet (20  mg total) by mouth daily.  . Insulin Aspart (NOVOLOG [place]) Inject into the skin. Insulin pump  . levothyroxine (SYNTHROID) 50 MCG tablet Take 1 tablet by mouth as directed.  Marland Kitchen lisinopril (PRINIVIL,ZESTRIL) 10 MG tablet Take 10 mg by mouth daily.  . Multiple Vitamin (MULTIVITAMIN) tablet Take 2 tablets by mouth daily.   . nitrofurantoin, macrocrystal-monohydrate, (MACROBID) 100 MG capsule Take 100 mg by mouth 2 (two) times daily.  Marland Kitchen omeprazole (PRILOSEC) 20 MG capsule omeprazole 20 mg capsule,delayed release  TK 1 C PO 1 TIME A DAY IN THE MORNING  . oxybutynin (DITROPAN-XL) 10 MG 24 hr tablet  oxybutynin chloride ER 10 mg tablet,extended release 24 hr  TK 1 T PO QD  . potassium chloride (KLOR-CON) 20 MEQ packet Take 20 mEq by mouth daily.  Marland Kitchen VITAMIN D, CHOLECALCIFEROL, PO Take 1,000 Units by mouth.  . ZOLPIDEM TARTRATE PO Take by mouth.     Allergies:   Sulfa drugs cross reactors   Social History   Socioeconomic History  . Marital status: Married    Spouse name: Not on file  . Number of children: Not on file  . Years of education: Not on file  . Highest education level: Not on file  Occupational History  . Not on file  Tobacco Use  . Smoking status: Former Smoker    Quit date: 12/19/2000    Years since quitting: 19.8  . Smokeless tobacco: Never Used  Substance and Sexual Activity  . Alcohol use: Yes    Alcohol/week: 7.0 standard drinks    Types: 7 Glasses of wine per week    Comment: 1 -2 glasses of wine daily or weekly  . Drug use: No  . Sexual activity: Yes  Other Topics Concern  . Not on file  Social History Narrative  . Not on file   Social Determinants of Health   Financial Resource Strain: Not on file  Food Insecurity: Not on file  Transportation Needs: Not on file  Physical Activity: Not on file  Stress: Not on file  Social Connections: Not on file     Family History: The patient's family history includes Alzheimer's disease in her mother; Heart disease in her father; Heart failure in her father. There is no history of Breast cancer.  ROS:   Review of Systems  Constitution: Negative for decreased appetite, fever and weight gain.  HENT: Negative for congestion, ear discharge, hoarse voice and sore throat.   Eyes: Negative for discharge, redness, vision loss in right eye and visual halos.  Cardiovascular: Negative for chest pain, dyspnea on exertion, leg swelling, orthopnea and palpitations.  Respiratory: Negative for cough, hemoptysis, shortness of breath and snoring.   Endocrine: Negative for heat intolerance and polyphagia.   Hematologic/Lymphatic: Negative for bleeding problem. Does not bruise/bleed easily.  Skin: Negative for flushing, nail changes, rash and suspicious lesions.  Musculoskeletal: Negative for arthritis, joint pain, muscle cramps, myalgias, neck pain and stiffness.  Gastrointestinal: Negative for abdominal pain, bowel incontinence, diarrhea and excessive appetite.  Genitourinary: Negative for decreased libido, genital sores and incomplete emptying.  Neurological: Negative for brief paralysis, focal weakness, headaches and loss of balance.  Psychiatric/Behavioral: Negative for altered mental status, depression and suicidal ideas.  Allergic/Immunologic: Negative for HIV exposure and persistent infections.    EKGs/Labs/Other Studies Reviewed:    The following studies were reviewed today:   EKG: None today  Zio monitor The patient wore the monitor for 3 days starting September 28, 2020. Indication: Paroxysmal atrial tachycardia  The minimum heart rate was 57 bpm, maximum heart rate was 111 bpm, and average heart rate was 73  bpm. Predominant underlying rhythm was Sinus Rhythm.  Premature atrial complexes were rare less than 1%. Premature Ventricular complexes were rare less than 1%.  No ventricular tachycardia, no pauses, No AV block, no supraventricular tachycardia and no atrial fibrillation present. No patient triggered events or diary events noted.   Conclusion: This was an unremarkable study with no significant arrhythmia.  Echocardiogram done at [facility] shows overall left systolic function 60 to 23%.  Right ventricle is normal size.  Left atrium is normal size.  Right atrium is normal in size and function.  There is mild aortic valve sclerosis.  Trace mitral regurgitation is present.  Mild tricuspid regurgitation is present.  Pulmonary artery systolic pressure 22 mmHg.  Aortic arch, ascending aorta and aortic root are appears to be within normals.  Pharmacologic nuclear  stress test no evidence of infarction or ischemia.  Left ventricle ejection fraction 69.  With normal wall motion.  CT of the chest no pulmonary embolism.  Mild pulmonary atelectasis or scarring.  Calcified atherosclerosis and injection of abdomen  Recent Labs: 10/06/2020: BUN 22; Creatinine, Ser 1.33; Hemoglobin 13.7; Platelets 328; Potassium 3.7; Sodium 139  Recent Lipid Panel No results found for: CHOL, TRIG, HDL, CHOLHDL, VLDL, LDLCALC, LDLDIRECT  Physical Exam:    VS:  BP (!) 110/48   Pulse 85   Ht 5\' 7"  (1.702 m)   Wt 159 lb (72.1 kg)   SpO2 99%   BMI 24.90 kg/m     Wt Readings from Last 3 Encounters:  10/12/20 150 lb (68 kg)  10/12/20 159 lb (72.1 kg)  09/28/20 166 lb 12.8 oz (75.7 kg)     GEN: Well nourished, well developed in no acute distress HEENT: Normal NECK: No JVD; No carotid bruits LYMPHATICS: No lymphadenopathy CARDIAC: S1S2 noted,RRR, no murmurs, rubs, gallops RESPIRATORY:  Clear to auscultation without rales, wheezing or rhonchi  ABDOMEN: Soft, non-tender, non-distended, +bowel sounds, no guarding. EXTREMITIES: No edema, No cyanosis, no clubbing MUSCULOSKELETAL:  No deformity  SKIN: Warm and dry NEUROLOGIC:  Alert and oriented x 3, non-focal PSYCHIATRIC:  Normal affect, good insight  ASSESSMENT:    1. Abnormal PFT   2. Primary hypertension   3. Shortness of breath   4. Diastolic dysfunction    PLAN:    She is here today for follow-up visit her shortness of breath is out of proportion.  I like to send the patient to the emergency department to be worked up.  She does appear to be euvolemic since starting the Lasix.  I discussed her PFT results with her, her husband in the room, and her daughter over the phone which showed moderate obstructive airway disease with severe diffusion defect.  She will need to follow with pulmonary and establish care.  Of her right now I think she needs acute care and taking her to the emergency department at this time  will be beneficial.    I was able to take the patient to emergency department in is because her care with the ER doctor.  If her primary condition is stable since she start bronchodilators and continued to be short of breath despite her recent negative stress test I would like to have the patient undergo a right and left heart catheterization to make sure this is not her anginal equivalent.  The patient is in agreement with the above plan. The patient left the  office in stable condition.  The patient will follow up in 2 weeks.   Medication Adjustments/Labs and Tests Ordered: Current medicines are reviewed at length with the patient today.  Concerns regarding medicines are outlined above.  No orders of the defined types were placed in this encounter.  No orders of the defined types were placed in this encounter.   There are no Patient Instructions on file for this visit.   Adopting a Healthy Lifestyle.  Know what a healthy weight is for you (roughly BMI <25) and aim to maintain this   Aim for 7+ servings of fruits and vegetables daily   65-80+ fluid ounces of water or unsweet tea for healthy kidneys   Limit to max 1 drink of alcohol per day; avoid smoking/tobacco   Limit animal fats in diet for cholesterol and heart health - choose grass fed whenever available   Avoid highly processed foods, and foods high in saturated/trans fats   Aim for low stress - take time to unwind and care for your mental health   Aim for 150 min of moderate intensity exercise weekly for heart health, and weights twice weekly for bone health   Aim for 7-9 hours of sleep daily   When it comes to diets, agreement about the perfect plan isnt easy to find, even among the experts. Experts at the [employer] developed an idea known as the Healthy Eating Plate. Just imagine a plate divided into logical, healthy portions.   The emphasis is on diet quality:   Load up on vegetables and  fruits - one-half of your plate: Aim for color and variety, and remember that potatoes dont count.   Go for whole grains - one-quarter of your plate: Whole wheat, barley, wheat berries, quinoa, oats, brown rice, and foods made with them. If you want pasta, go with whole wheat pasta.   Protein power - one-quarter of your plate: Fish, chicken, beans, and nuts are all healthy, versatile protein sources. Limit red meat.   The diet, however, does go beyond the plate, offering a few other suggestions.   Use healthy plant oils, such as olive, canola, soy, corn, sunflower and peanut. Check the labels, and avoid partially hydrogenated oil, which have unhealthy trans fats.   If youre thirsty, drink water. Coffee and tea are good in moderation, but skip sugary drinks and limit milk and dairy products to one or two daily servings.   The type of carbohydrate in the diet is more important than the amount. Some sources of carbohydrates, such as vegetables, fruits, whole grains, and beans-are healthier than others.   Finally, stay active  Signed, Berniece Salines, DO  10/12/2020 3:25 PM    [place]

## 2020-10-12 NOTE — ED Triage Notes (Signed)
C/o SOB x 1 month , sent here from PMD office for eval

## 2020-10-12 NOTE — ED Notes (Signed)
Patient denies pain and is resting comfortably.  

## 2020-10-13 ENCOUNTER — Encounter: Payer: Self-pay | Admitting: Pulmonary Disease

## 2020-10-13 ENCOUNTER — Ambulatory Visit (INDEPENDENT_AMBULATORY_CARE_PROVIDER_SITE_OTHER): Payer: Medicare Other | Admitting: Pulmonary Disease

## 2020-10-13 VITALS — BP 110/72 | HR 68 | Temp 97.4°F | Ht 67.0 in | Wt 158.0 lb

## 2020-10-13 DIAGNOSIS — J449 Chronic obstructive pulmonary disease, unspecified: Secondary | ICD-10-CM

## 2020-10-13 MED ORDER — PREDNISONE 20 MG PO TABS: 20.0000 mg | ORAL_TABLET | Freq: Every day | ORAL | 0 refills | Status: DC

## 2020-10-13 MED ORDER — TRELEGY ELLIPTA 200-62.5-25 MCG/INH IN AEPB: 2.0000 | INHALATION_SPRAY | Freq: Every day | RESPIRATORY_TRACT | 5 refills | Status: DC

## 2020-10-13 MED ORDER — TRELEGY ELLIPTA 200-62.5-25 MCG/INH IN AEPB: 1.0000 | INHALATION_SPRAY | Freq: Every day | RESPIRATORY_TRACT | 0 refills | Status: DC

## 2020-10-13 MED ORDER — BREO ELLIPTA 100-25 MCG/INH IN AEPB: 1.0000 | INHALATION_SPRAY | Freq: Every day | RESPIRATORY_TRACT | 0 refills | Status: DC

## 2020-10-13 NOTE — Progress Notes (Signed)
April Long    [id number]    16-Sep-1946  Primary Care Physician:Prochnau, Chrys Racer, MD  Referring Physician: Ernestene Kiel, MD [place]. [place],  [place] [id number]  Chief complaint: Consult for dyspnea  HPI: 74 year old ex-smoker but complains of dyspnea on exertion for the past 6 months.  Denies any cough, sputum production, fevers, chills Evaluated by cardiology Dr. Harriet Masson with a normal echocardiogram and stress test CT chest at [facility] in November 2021 with no pulmonary embolism, mild atelectasis  She had a ED visit yesterday with chest x-ray showing hyperinflation.  EKG was unremarkable.  She is given IV fluids for dehydration and Lasix stopped given slight increase in creatinine.  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  No feather pillows or comforter Smoking history: 30-pack-year smoker.  Quit in 2020 Travel history: No significant travel history Relevant family history: No significant family history of lung disease   Outpatient Encounter Medications as of 10/13/2020  Medication Sig  . atorvastatin (LIPITOR) 10 MG tablet Take 10 mg by mouth daily.  Marland Kitchen buPROPion (WELLBUTRIN XL) 300 MG 24 hr tablet Take 1 tablet once a day for anxiety and mood  . busPIRone (BUSPAR) 10 MG tablet Take 10 mg by mouth daily. For anxiety  . Calcium Carbonate-Vitamin D 600-400 MG-UNIT tablet Take 1 tablet by mouth daily. With food  . CYANOCOBALAMIN-CAFFEINE PO [Gummy] Take as directed  . Insulin Aspart (NOVOLOG Shelly) Inject into the skin. Insulin pump  . levothyroxine (SYNTHROID) 50 MCG tablet Take 1 tablet by mouth as directed.  Marland Kitchen lisinopril (PRINIVIL,ZESTRIL) 10 MG tablet Take 10 mg by mouth daily.  . Multiple Vitamin (MULTIVITAMIN) tablet Take 2 tablets by mouth daily.   Marland Kitchen VITAMIN D, CHOLECALCIFEROL, PO Take 1,000 Units by mouth.  . ZOLPIDEM TARTRATE PO Take by mouth.  . [DISCONTINUED] co-enzyme Q-10 30 MG capsule Take 100  mg by mouth daily.   . [DISCONTINUED] escitalopram (LEXAPRO) 20 MG tablet   . [DISCONTINUED] furosemide (LASIX) 20 MG tablet Take 1 tablet (20 mg total) by mouth daily.  . [DISCONTINUED] nitrofurantoin, macrocrystal-monohydrate, (MACROBID) 100 MG capsule Take 100 mg by mouth 2 (two) times daily.  . [DISCONTINUED] omeprazole (PRILOSEC) 20 MG capsule omeprazole 20 mg capsule,delayed release  TK 1 C PO 1 TIME A DAY IN THE MORNING  . [DISCONTINUED] oxybutynin (DITROPAN-XL) 10 MG 24 hr tablet oxybutynin chloride ER 10 mg tablet,extended release 24 hr  TK 1 T PO QD  . [DISCONTINUED] potassium chloride (KLOR-CON) 20 MEQ packet Take 20 mEq by mouth daily.   No facility-administered encounter medications on file as of 10/13/2020.    Allergies as of 10/13/2020 - Review Complete 10/13/2020  Allergen Reaction Noted  . Sulfa drugs cross reactors Rash 02/17/2011    Past Medical History:  Diagnosis Date  . Abnormal gait 06/10/2018  . Acute pain of right wrist 08/20/2019  . Aphakia of left eye 07/19/2015  . 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 Cancer ([place])   . Carpal tunnel syndrome of right wrist 12/20/2019  . Cataract 09/26/2020  . Chronic venous insufficiency 02/05/2018  . Diabetes mellitus type 1 ([place]) 11/20/2016  . Hypercholesterolemia 11/20/2016  . Hypertension   . Hypothyroidism   . Insulin pump status 11/20/2016  . 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 Long toenail  01/02/2016  . Nondiabetic proliferative retinopathy 09/18/2011  . Osteopenia of multiple sites 11/20/2016  . Osteoporosis    early stage  . PAD (peripheral artery disease) ([place]) 11/27/2017  . Pain in right foot 11/26/2017  . Pain of right thumb 11/04/2019  . Paresthesia of upper limb 06/24/2019  . Primary hypothyroidism 11/20/2016  . Pseudophakia of right eye 07/19/2015  . Right posterior capsular opacification 07/19/2015  .  Slurring of speech 08/03/2013  . Stage 3a chronic kidney disease ([place]) 12/31/2018  . Syncope 08/03/2013  . Thyroid disease   . 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 BREAST LUMPECTOMY Left   . BREAST SURGERY  2012   lumpectomy  . EYE SURGERY  1983   retinal reattachment  . TUBAL LIGATION  1974    Family History  Problem Relation Age of Onset  . Alzheimer's disease Mother   . Heart disease Father   . Heart failure Father   . Breast cancer Neg Hx     Social History   Socioeconomic History  . Marital status: Married    Spouse name: Not on file  . Number of children: Not on file  . Years of education: Not on file  . Highest education level: Not on file  Occupational History  . Not on file  Tobacco Use  . Smoking status: Former Smoker    Quit date: 12/19/2000    Years since quitting: 19.8  . Smokeless tobacco: Never Used  Substance and Sexual Activity  . Alcohol use: Yes    Alcohol/week: 7.0 standard drinks    Types: 7 Glasses of wine per week    Comment: 1 -2 glasses of wine daily or weekly  . Drug use: No  . Sexual activity: Yes  Other Topics Concern  . Not on file  Social History Narrative  . Not on file   Social Determinants of Health   Financial Resource Strain: Not on file  Food Insecurity: Not on file  Transportation Needs: Not on file  Physical Activity: Not on file  Stress: Not on file  Social Connections: Not on file  Intimate Partner Violence: Not on file    Review of systems: Review of Systems  Constitutional: Negative for fever and chills.  HENT: Negative.   Eyes: Negative for blurred vision.  Respiratory: as per HPI  Cardiovascular: Negative for chest pain and palpitations.  Gastrointestinal: Negative for vomiting, diarrhea, blood per rectum. Genitourinary: Negative for dysuria, urgency, frequency and hematuria.  Musculoskeletal: Negative for myalgias, back pain and joint pain.   Skin: Negative for itching and rash.  Neurological: Negative for dizziness, tremors, focal weakness, seizures and loss of consciousness.  Endo/Heme/Allergies: Negative for environmental allergies.  Psychiatric/Behavioral: Negative for depression, suicidal ideas and hallucinations.  All other systems reviewed and are negative.  Physical Exam: Blood pressure 110/72, pulse 68, temperature (!) 97.4 F (36.3 C), temperature source Oral, height 5\' 7"  (1.702 m), weight 158 lb (71.7 kg), SpO2 95 %. Gen:      No acute distress HEENT:  EOMI, sclera anicteric Neck:     No masses; no thyromegaly Lungs:    Clear to auscultation bilaterally; normal respiratory effort CV:         Regular rate and rhythm; no murmurs Abd:      + bowel sounds; soft, non-tender; no palpable masses, no distension Ext:    No edema; adequate peripheral perfusion Skin:      Warm and dry;  no rash Neuro: alert and oriented x 3 Psych: normal mood and affect  Data Reviewed: Imaging: CT 811/8/21 No PE, mild pulmonary atelectasis, atherosclerosis.  I have reviewed the images personally.  PFTs: 10/04/2020 FVC 2.67 [81%], FEV1 1.69 [69%], F/F 63, TLC 4.88 [88%], DLCO 7.19 [25%] Moderate obstruction with severe diffusion defect  Labs: CBC 10/12/2020-WBC 7.7, eos 6%, absolute eosinophil count 462  Cardiology 2021-echocardiogram done at [facility] shows overall left systolic function 60 to [id number].  Right ventricle is normal size.  Left atrium is normal size.  Right atrium is normal in size and function.  There is mild aortic valve sclerosis.  Trace mitral regurgitation is present.  Mild tricuspid regurgitation is present.  Pulmonary artery systolic pressure 22 mmHg.  Aortic arch, ascending aorta and aortic root are appears to be within normals.  08/30/2020 -pharmacologic nuclear stress test no evidence of infarction or ischemia.  Left ventricle ejection fraction 69.  With normal wall motion.  Assessment:  Evaluation for  dyspnea PFTs reviewed with moderate COPD, likely from smoking Start Trelegy inhaler.  She will benefit from inhaled corticosteroids given elevated peripheral eosinophils.  Recent cardiac work-up was okay with normal EF, no pulmonary hypertension. CT reviewed with mild basal atelectasis.  Suspicion for ILD/pulmonary fibrosis is low given normal lung volumes on PFTs.  If however the inhalers do not help then consider high-resolution CT.  Plan discussed in detail with patient and husband today.  Plan/Recommendations: Trelegy inhaler  Marshell Garfinkel MD [facility] 10/13/2020, 9:39 AM  CC: Ernestene Kiel, MD

## 2020-10-13 NOTE — Patient Instructions (Signed)
I have reviewed your PFTs which show COPD likely from prior smoking We will start you on Trelegy 200 inhaler Prednisone 40 mg a day for 5 days  Follow-up in 1 to 2 months for review

## 2020-10-18 ENCOUNTER — Telehealth: Payer: Self-pay | Admitting: Pulmonary Disease

## 2020-10-18 NOTE — Telephone Encounter (Signed)
Primary Pulmonologist: Dr. Isaiah Serge Last office visit and with whom: 10/13/20 with Dr. Isaiah Serge What do we see them for (pulmonary problems): COPD Last OV assessment/plan: Assessment:  Evaluation for dyspnea PFTs reviewed with moderate COPD, likely from smoking Start Trelegy inhaler.  She will benefit from inhaled corticosteroids given elevated peripheral eosinophils.  Recent cardiac work-up was okay with normal EF, no pulmonary hypertension. CT reviewed with mild basal atelectasis.  Suspicion for ILD/pulmonary fibrosis is low given normal lung volumes on PFTs.  If however the inhalers do not help then consider high-resolution CT.  Plan discussed in detail with patient and husband today.  Plan/Recommendations: Trelegy inhaler  Chilton Greathouse MD [facility] 10/13/2020, 9:39 AM  CC: Philemon Kingdom, MD  Was appointment offered to patient (explain)?  Patient wanted recommendations, due to the way the patient was breathing on the phone, I advised her she may needed to go to the ER.    Reason for call: Spoke with patient. She stated that her breathing has not improved since she saw Dr. Isaiah Serge on 10/13/20. It is back to same condition it was when she went to the ER a few weeks ago. Patient was noticeably SOB on the phone. She had to stop every few words to catch her breath. She does not have O2 at home. She stated that she becomes SOB with even the slightest movement. She wanted clarification on her Trelegy inhaler since it was entered into MyChart twice with 2 different instructions. I advised her that Trelegy is only one puff once a daily. She has been using it as 2 puffs once a day.   She denied any fevers, body aches or coughing. She has received 2 covid vaccines and 1 booster. Denies being around anyone sick recently.   (examples of things to ask: : When did symptoms start? Fever? Cough? Productive? Color to sputum? More sputum than usual? Wheezing? Have you  needed increased oxygen? Are you taking your respiratory medications? What over the counter measures have you tried?)  Allergies  Allergen Reactions  . Sulfa Drugs Cross Reactors Rash    On chest only    Immunization History  Administered Date(s) Administered  . Influenza Split 06/22/2018, 07/06/2019, 07/22/2020  . Influenza, High Dose Seasonal PF 09/10/2017, 08/18/2018, 07/06/2019  . Influenza,inj,quad, With Preservative 09/04/2017  . Influenza-Unspecified 09/04/2017, 06/22/2018  . PFIZER SARS-COV-2 Vaccination 11/07/2019, 11/28/2019, 07/08/2020   Arlys John, can you please advise?

## 2020-10-18 NOTE — Telephone Encounter (Signed)
10/18/2020  Agree with recommendations on Trelegy Ellipta.  If she feels that her shortness of breath has not improved then likely she would benefit from an in person evaluation either in our office or an urgent care.  If symptoms have significantly worsened or she is struggling with shortness of breath to the point where she is having chest pain her oxygen levels are below 88% and she needs to seek emergent evaluation in the emergency room.  Elisha Headland, FNP

## 2020-10-18 NOTE — Telephone Encounter (Signed)
Called and spoke with pt stating to her the info from [place]. Stated to her that when rx for trelegy was sent to pharmacy for pt, the instructions were sent in wrong. Stated to her that the Trelegy instructions are one puff once daily NOT two puffs daily. Pt verbalized understanding. Stated to her if her breathing became worse and if she was struggling to breathe that she needed to seek emergent evaluation either at urgent care or go back to ED and pt verbalized understanding. Nothing further needed.

## 2020-10-19 ENCOUNTER — Telehealth: Payer: Self-pay | Admitting: Pulmonary Disease

## 2020-10-19 DIAGNOSIS — R0602 Shortness of breath: Secondary | ICD-10-CM | POA: Diagnosis not present

## 2020-10-19 DIAGNOSIS — F419 Anxiety disorder, unspecified: Secondary | ICD-10-CM | POA: Diagnosis not present

## 2020-10-19 DIAGNOSIS — J449 Chronic obstructive pulmonary disease, unspecified: Secondary | ICD-10-CM | POA: Diagnosis not present

## 2020-10-19 NOTE — Telephone Encounter (Signed)
Patient's daughter calling wanting advise regarding the ED.  She is currently in the [facility] ED, has been triaged and sent back to the waiting room.  She asked me to call the ED to see if she can be direct admitted and check out the waiting times.  I called and obtained that information.  They are holding in both [facility] and [facility] ED and no one is able to be direct admitted.  They will take her to [facility] since her records are threr and pulmonary phyaician are there as we well.  Scheduled an appointment with an app for next week since inhaler is notr working.

## 2020-10-19 NOTE — Telephone Encounter (Signed)
10/19/2020  As stated previously earlier this week.  She needs to have emergency room eval or urgent care eval in person.  This cannot be managed telephonically.  Especially if she is already been treated with prednisone without any clinical improvement.  Elisha Headland, FNP

## 2020-10-19 NOTE — Telephone Encounter (Signed)
Please see other encounter from today 12/29.

## 2020-10-19 NOTE — Telephone Encounter (Signed)
I called and spoke with the pt  She was seen on 10/13/20 and given the following recs:   Assessment:  Evaluation for dyspnea PFTs reviewed with moderate COPD, likely from smoking Start Trelegy inhaler.  She will benefit from inhaled corticosteroids given elevated peripheral eosinophils.  Recent cardiac work-up was okay with normal EF, no pulmonary hypertension. CT reviewed with mild basal atelectasis.  Suspicion for ILD/pulmonary fibrosis is low given normal lung volumes on PFTs.  If however the inhalers do not help then consider high-resolution CT.  Plan discussed in detail with patient and husband today.  Plan/Recommendations: Trelegy inhaler  Chilton Greathouse MD [facility] 10/13/2020, 9:39 AM  CC: Philemon Kingdom, MD   She states that her breathing seems to be getting worse  She gets winded just walking across the room and sounded very SOB on the phone  She is taking her trelegy daily  She states has finished her pred as of today, and this did not help at all  She denies any f/c/s, aches

## 2020-10-19 NOTE — Telephone Encounter (Signed)
Called and spoke with patient, advised of recommendations per Arlys John.  She verbalized understanding.  Nothing further needed.

## 2020-10-19 NOTE — Telephone Encounter (Signed)
Mandee pts daughter is calling in wanting to know if April Long will call ahead to the ER for pts visit. Daughter has expressed last time they went to the ER they had to wait at least 7-8 hrs before beng seen . please advise

## 2020-10-26 ENCOUNTER — Ambulatory Visit: Payer: Medicare Other | Admitting: Cardiology

## 2020-10-28 ENCOUNTER — Encounter: Payer: Self-pay | Admitting: Primary Care

## 2020-10-28 ENCOUNTER — Ambulatory Visit (INDEPENDENT_AMBULATORY_CARE_PROVIDER_SITE_OTHER): Payer: Medicare Other | Admitting: Primary Care

## 2020-10-28 ENCOUNTER — Other Ambulatory Visit: Payer: Self-pay

## 2020-10-28 VITALS — BP 122/70 | HR 64 | Temp 98.1°F | Ht 67.0 in | Wt 162.0 lb

## 2020-10-28 DIAGNOSIS — J449 Chronic obstructive pulmonary disease, unspecified: Secondary | ICD-10-CM | POA: Diagnosis not present

## 2020-10-28 MED ORDER — BREZTRI AEROSPHERE 160-9-4.8 MCG/ACT IN AERO: 2.0000 | INHALATION_SPRAY | Freq: Two times a day (BID) | RESPIRATORY_TRACT | 0 refills | Status: DC

## 2020-10-28 MED ORDER — ALBUTEROL SULFATE HFA 108 (90 BASE) MCG/ACT IN AERS: 2.0000 | INHALATION_SPRAY | Freq: Four times a day (QID) | RESPIRATORY_TRACT | 2 refills | Status: AC | PRN

## 2020-10-28 NOTE — Assessment & Plan Note (Addendum)
Moderate-high symptom burden. Patient reports little improvement in breathing since starting Trelegy 100. Recommend changing to Breztri two puffs twice daily to use with aerochamber. If still not better would recommend HRCT to further evaluate atelectasis that was seen on CXR. Recommend patient use IS 5-10 breaths every hour while awake and refer to pulmonary rehab. FU in 2-4 weeks with Dr. Vaughan Browner or Eustaquio Maize NP

## 2020-10-28 NOTE — Progress Notes (Signed)
$'@Patient'h$  ID: April Long, female    DOB: [birth year], 75 y.o.   MRN: [id number]  Chief Complaint  Patient presents with  . Follow-up    Pt states she is still having a lot of problems with her breathing and states she is not getting any relief after being prescribed Trelegy. Pt denies any complaints of cough or chest discomfort.    Referring provider: Ernestene Kiel, MD  HPI: 75 year old female, former smoker quit in 2002.  Last medical history significant for hypertension, diabetes type 1, hypothyroidism, chronic kidney disease stage IIIa, COPD/emphysema.  Patient of Dr. Vaughan Browner, last seen in office on 10/13/2020.  PFT showed moderate COPD.  She is elevated peripheral eosinophils.  CT imaging showed mild atelectasis at bases. She was started on Trelegy 200 and given course of prednisone 40 mg x 5 days. Suspicion for ILD/pulmonary fibrosis is low however if inhalers do not help consider high-resolution CT.   10/28/2020- Interim hx  Patient presents today for 2 week follow-up. She was last seen by Dr. Vaughan Browner on 10/13/20 and given trial Trelegy. She does not feelTrelegy has not helped her breathing much at all but her husband and daughter has noticed some minor improvement. Patient reports that she gets winded doing simple tasks such as going to kitchen or bathroom. She has been trying to walk at the mall but gets easily out of breath. Denies acute symptoms such as cough, chest tightness or wheezing.     Allergies  Allergen Reactions  . Sulfa Drugs Cross Reactors Rash    On chest only    Immunization History  Administered Date(s) Administered  . Influenza Split 06/22/2018, 07/06/2019, 07/22/2020  . Influenza, High Dose Seasonal PF 09/10/2017, 08/18/2018, 07/06/2019  . Influenza,inj,quad, With Preservative 09/04/2017  . Influenza-Unspecified 09/04/2017, 06/22/2018  . PFIZER SARS-COV-2 Vaccination 11/07/2019, 11/28/2019, 07/08/2020    Past Medical History:  Diagnosis Date  .  Abnormal gait 06/10/2018  . Acute pain of right wrist 08/20/2019  . Aphakia of left eye 07/19/2015  . 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 Cancer ([place])   . Carpal tunnel syndrome of right wrist 12/20/2019  . Cataract 09/26/2020  . Chronic venous insufficiency 02/05/2018  . Diabetes mellitus type 1 ([place]) 11/20/2016  . Hypercholesterolemia 11/20/2016  . Hypertension   . Hypothyroidism   . Insulin pump status 11/20/2016  . 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 Long toenail 01/02/2016  . Nondiabetic proliferative retinopathy 09/18/2011  . Osteopenia of multiple sites 11/20/2016  . Osteoporosis    early stage  . PAD (peripheral artery disease) ([place]) 11/27/2017  . Pain in right foot 11/26/2017  . Pain of right thumb 11/04/2019  . Paresthesia of upper limb 06/24/2019  . Primary hypothyroidism 11/20/2016  . Pseudophakia of right eye 07/19/2015  . Right posterior capsular opacification 07/19/2015  . Slurring of speech 08/03/2013  . Stage 3a chronic kidney disease ([place]) 12/31/2018  . Syncope 08/03/2013  . Thyroid disease   . 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 Quit date: 12/19/2000  . Years since quitting: 19.8  Smokeless Tobacco Never Used   Counseling given: Not Answered   Outpatient Medications Prior to Visit  Medication Sig Dispense Refill  . atorvastatin (LIPITOR) 10 MG tablet Take 10 mg by mouth daily.    Marland Kitchen  buPROPion (WELLBUTRIN XL) 300 MG 24 hr tablet Take 1 tablet once a day for anxiety and mood    . busPIRone (BUSPAR) 10 MG tablet Take 10 mg by mouth daily. For anxiety    . Calcium Carbonate-Vitamin D 600-400 MG-UNIT tablet Take 1 tablet by mouth daily. With food    . CYANOCOBALAMIN-CAFFEINE PO [Gummy] Take as directed    . Fluticasone-Umeclidin-Vilant (TRELEGY ELLIPTA) 200-62.5-25 MCG/INH AEPB  Inhale 2 puffs into the lungs daily. 1 each 5  . Insulin Aspart (NOVOLOG [place]) Inject into the skin. Insulin pump    . levothyroxine (SYNTHROID) 50 MCG tablet Take 1 tablet by mouth as directed.    Marland Kitchen lisinopril (PRINIVIL,ZESTRIL) 10 MG tablet Take 10 mg by mouth daily.    . Multiple Vitamin (MULTIVITAMIN) tablet Take 2 tablets by mouth daily.     Marland Kitchen VITAMIN D, CHOLECALCIFEROL, PO Take 1,000 Units by mouth.    . ZOLPIDEM TARTRATE PO Take by mouth.    . Fluticasone-Umeclidin-Vilant (TRELEGY ELLIPTA) 200-62.5-25 MCG/INH AEPB Inhale 1 puff into the lungs daily. 28 each 0  . predniSONE (DELTASONE) 20 MG tablet Take 1 tablet (20 mg total) by mouth daily with breakfast. Take 2 tabs daily for 5 days 10 tablet 0   No facility-administered medications prior to visit.    Review of Systems  Review of Systems  Constitutional: Negative.   Respiratory: Positive for shortness of breath.    Physical Exam  BP 122/70 (BP Location: Left Arm, Cuff Size: Normal)   Pulse 64   Temp 98.1 F (36.7 C) (Other (Comment)) Comment (Src): wrist  Ht 5' 7"  (1.702 m)   Wt 162 lb (73.5 kg)   SpO2 98%   BMI 25.37 kg/m  Physical Exam Constitutional:      Appearance: Normal appearance.  HENT:     Head: Normocephalic and atraumatic.     Mouth/Throat:     Comments: Deferred d/t masking Cardiovascular:     Rate and Rhythm: Normal rate and regular rhythm.  Pulmonary:     Effort: Pulmonary effort is normal.     Breath sounds: No wheezing or rhonchi.  Musculoskeletal:        General: Normal range of motion.     Cervical back: Normal range of motion and neck supple.  Lymphadenopathy:     Cervical: No cervical adenopathy.  Skin:    General: Skin is warm and dry.  Neurological:     General: No focal deficit present.     Mental Status: She is alert and oriented to person, place, and time. Mental status is at baseline.  Psychiatric:        Mood and Affect: Mood normal.        Behavior: Behavior normal.         Thought Content: Thought content normal.        Judgment: Judgment normal.      Lab Results:  CBC    Component Value Date/Time   WBC 7.7 10/12/2020 1515   RBC 4.29 10/12/2020 1515   HGB 13.4 10/12/2020 1515   HGB 13.2 02/18/2013 1207   HCT 39.1 10/12/2020 1515   HCT 39.6 02/18/2013 1207   PLT 304 10/12/2020 1515   PLT 252 02/18/2013 1207   MCV 91.1 10/12/2020 1515   MCV 91.5 02/18/2013 1207   MCH 31.2 10/12/2020 1515   MCHC 34.3 10/12/2020 1515   RDW 13.1 10/12/2020 1515   RDW 13.7 02/18/2013 1207   LYMPHSABS 1.9 10/12/2020 1515   LYMPHSABS 1.3  02/18/2013 1207   MONOABS 1.0 10/12/2020 1515   MONOABS 1.0 (H) 02/18/2013 1207   EOSABS 0.4 10/12/2020 1515   EOSABS 0.1 02/18/2013 1207   BASOSABS 0.1 10/12/2020 1515   BASOSABS 0.0 02/18/2013 1207    BMET    Component Value Date/Time   NA 136 10/12/2020 1515   NA 142 02/18/2013 1207   K 3.7 10/12/2020 1515   K 4.1 02/18/2013 1207   CL 99 10/12/2020 1515   CL 105 02/18/2013 1207   CO2 25 10/12/2020 1515   CO2 29 02/18/2013 1207   GLUCOSE 112 (H) 10/12/2020 1515   GLUCOSE 118 (H) 02/18/2013 1207   BUN 32 (H) 10/12/2020 1515   BUN 20.1 02/18/2013 1207   CREATININE 1.63 (H) 10/12/2020 1515   CREATININE 1.0 02/18/2013 1207   CALCIUM 9.5 10/12/2020 1515   CALCIUM 9.2 02/18/2013 1207   GFRNONAA 33 (L) 10/12/2020 1515   GFRAA (L) 12/04/2010 1023    56        The eGFR has been calculated using the MDRD equation. This calculation has not been validated in all clinical situations. eGFR's persistently <60 mL/min signify possible Chronic Kidney Disease.    BNP    Component Value Date/Time   BNP 20.1 10/12/2020 1515    ProBNP No results found for: PROBNP  Imaging: DG Chest 2 View  Result Date: 10/06/2020 CLINICAL DATA:  Shortness of breath for several weeks EXAM: CHEST - 2 VIEW COMPARISON:  08/29/2020 FINDINGS: Cardiac shadow is within normal limits. Aortic calcifications are again seen. The lungs are mildly  hyperinflated. No focal infiltrate or sizable effusion is seen. No acute bony abnormality is noted. IMPRESSION: Mild COPD without acute abnormality. Electronically Signed   By: Inez Catalina M.D.   On: 10/06/2020 14:23   DG Chest Port 1 View  Result Date: 10/12/2020 CLINICAL DATA:  Shortness of breath for 1 month EXAM: PORTABLE CHEST 1 VIEW COMPARISON:  10/06/2020 FINDINGS: Lungs are hyperinflated as can be seen with COPD. No focal consolidation. No pleural effusion or pneumothorax. Heart and mediastinal contours are unremarkable. No acute osseous abnormality. IMPRESSION: No active disease. Electronically Signed   By: Kathreen Devoid   On: 10/12/2020 15:34   LONG TERM MONITOR (3-14 DAYS)  Result Date: 10/10/2020 The patient wore the monitor for 3 days starting September 28, 2020. Indication: Paroxysmal atrial tachycardia The minimum heart rate was 57 bpm, maximum heart rate was 111 bpm, and average heart rate was 73  bpm. Predominant underlying rhythm was Sinus Rhythm. Premature atrial complexes were rare less than 1%. Premature Ventricular complexes were rare less than 1%. No ventricular tachycardia, no pauses, No AV block, no supraventricular tachycardia and no atrial fibrillation present. No patient triggered events or diary events noted. Conclusion: This was an unremarkable study with no significant arrhythmia.    Assessment & Plan:   Moderate COPD (chronic obstructive pulmonary disease) (HCC) Moderate-high symptom burden. Patient reports little improvement in breathing since starting Trelegy 100. Recommend changing to Breztri two puffs twice daily to use with aerochamber. If still not better would recommend HRCT to further evaluate atelectasis that was seen on CXR. Recommend patient use IS 5-10 breaths every hour while awake and refer to pulmonary rehab. FU in 2-4 weeks with Dr. Vaughan Browner or Eustaquio Maize NP   Martyn Ehrich, NP 10/28/2020

## 2020-10-28 NOTE — Patient Instructions (Addendum)
Recommendations: - Stop Trelegy (FOR NOW) - Start Breztri - take two puffs morning and bedtime (rinse mouth after use) - Use albuterol rescue inhaler 2 puffs every 4-6 hours for breakthrough shortness of breath/wheezing  - Use incentive spirometer 5-10 breaths every hour while awake   Orders: - Pulmonary rehab re: COPD GOLD II ([place]) - Incentive spirometer   Follow-up: - 2-4 week follow-up with Dr. Vaughan Browner or Eustaquio Maize NP   Budesonide; Glycopyrrolate; Formoterol inhalation aerosol What is this medicine? BUDESONIDE; GLYCOPYRROLATE; FORMOTEROL (byoo DES oh nide; glye koe PYE roe late; for [facility] te rol) inhalation is a combination of 3 medicines. Budesonide decreases inflammation in the lungs. Formoterol and glycopyrrolate are bronchodilators that help keep airways open. This medicine is used to treat chronic obstructive pulmonary disease (COPD). Do not use this drug combination for acute COPD attacks or bronchospasm. This medicine may be used for other purposes; ask your health care provider or pharmacist if you have questions. COMMON BRAND NAME(S): BREZTRI What should I tell my health care provider before I take this medicine? They need to know if you have any of these conditions:  bladder problems or trouble passing urine  bone problems  diabetes  eye disease, vision problems  heart disease  high blood pressure  history of irregular heartbeat  immune system problems  infection  kidney disease  pheochromocytoma  prostate disease  seizures  thyroid disease  an unusual or allergic reaction to budesonide, formoterol, glycopyrrolate, medicines, foods, dyes, or preservatives  pregnant or trying to get pregnant  breast-feeding How should I use this medicine? This medicine is inhaled through the mouth. Follow the directions on the prescription label. Shake well before each use. Rinse your mouth with water after use. Make sure not to swallow the water. Take your medicine at  regular intervals. Do not take your medicine more often than directed. Do not stop taking except on your doctor's advice. Make sure that you are using your inhaler correctly. This drug comes with INSTRUCTIONS FOR USE. Ask your pharmacist for directions on how to use this drug. Read the information carefully. Talk to your pharmacist or health care provider if you have questions. Talk to your pediatrician regarding the use of this medicine in children. This medicine is not approved for use in children. Overdosage: If you think you have taken too much of this medicine contact a poison control center or emergency room at once. NOTE: This medicine is only for you. Do not share this medicine with others. What if I miss a dose? If you miss a dose, use it as soon as you can. If it is almost time for your next dose, use only that dose. Do not use double or extra doses. What may interact with this medicine? Do not take the medicine with any of the following medications:  cisapride  dofetilide  dronedarone  MAOIs like Marplan, Nardil, and Parnate  other medicines that contain long-acting beta-2 agonists (LABAs) like arformoterol, formoterol, indacaterol, olodaterol, salmeterol, vilanterol  pimozide  procarbazine  thioridazine This medicine may also interact with the following medications:  antihistamines for allergy, cough, and cold  atropine  certain antibiotics like clarithromycin, telithromycin  certain antivirals for HIV or hepatitis  certain heart medicines like atenolol, metoprolol  certain medicines for bladder problems like oxybutynin, tolterodine  certain medicines for blood pressure, heart disease, irregular heartbeat  certain medicines for depression, anxiety, or psychotic disturbances  certain medicines for fungal infections like ketoconazole, itraconazole  certain medicines for Parkinson's disease  like benztropine, trihexyphenidyl  certain medicines for stomach  problems like dicyclomine, hyoscyamine  certain medicines for travel sickness like scopolamine  diuretics  grapefruit juice  mifepristone  other inhaled medicines that contain anticholinergics such as aclidinium, ipratropium, tiotropium, umeclidinium  other medicines that prolong the QT interval (an abnormal heart rhythm)  some vaccines  steroid medicines like prednisone or cortisone  stimulant medicines for attention disorders, weight loss, or to stay awake  theophylline This list may not describe all possible interactions. Give your health care provider a list of all the medicines, herbs, non-prescription drugs, or dietary supplements you use. Also tell them if you smoke, drink alcohol, or use illegal drugs. Some items may interact with your medicine. What should I watch for while using this medicine? Visit your health care professional for regular checks on your progress. Tell your health care professional if your symptoms do not start to get better or if they get worse. NEVER use this medicine for an acute asthma or COPD attack. You should use your short-acting rescue inhalers for this purpose. If your symptoms get worse or if you need your short-acting inhalers more often, call your doctor right away. This medicine may increase your risk of getting an infection. Tell your doctor or health care professional if you are around anyone with measles or chickenpox, or if you develop sores or blisters that do not heal properly. Do not get this medicine in your eyes. It can cause irritation, pain, or blurred vision. What side effects may I notice from receiving this medicine? Side effects that you should report to your doctor or health care professional as soon as possible:  allergic reactions like skin rash, itching or hives, swelling of the face, lips, or tongue  anxious  breathing problems  changes in vision, eye pain  muscle cramps or muscle pain  signs and symptoms of a  dangerous change in heartbeat or heart rhythm like chest pain; dizziness; fast or irregular heartbeat; palpitations; feeling faint or lightheaded, falls; breathing problems  signs and symptoms of high blood sugar such as being more thirsty or hungry or having to urinate more than normal. You may also feel very tired or have blurry vision  signs and symptoms of infection like fever; chills; cough; sore throat; pain or trouble passing urine  tremors  trouble passing urine or change in the amount of urine  unusually weak or tired  white patches in the mouth or mouth sores Side effects that usually do not require medical attention (report these to your doctor or health care professional if they continue or are bothersome):  back pain  changes in taste  cough  diarrhea  runny or stuffy nose  upset stomach This list may not describe all possible side effects. Call your doctor for medical advice about side effects. You may report side effects to FDA at 1-800-FDA-1088. Where should I keep my medicine? Keep out of the reach of children. Store in a dry place at room temperature between 15 and 30 degrees C (59 and 86 degrees F). Protect from heat. Do not freeze. Do not use or store near heat or flame, as the canister may burst. Throw away the inhaler 3 months after you open the foil pouch (for the 120-inhalation canister), or 3 weeks after you open the foil pouch (for the 28-inhalation canister), or when the dose indicator reaches zero "0", whichever comes first. NOTE: This sheet is a summary. It may not cover all possible information. If you have  questions about this medicine, talk to your doctor, pharmacist, or health care provider.  2020 Elsevier/Gold Standard (2019-05-19 18:58:00)

## 2020-11-03 DIAGNOSIS — R350 Frequency of micturition: Secondary | ICD-10-CM | POA: Diagnosis not present

## 2020-11-03 DIAGNOSIS — N3942 Incontinence without sensory awareness: Secondary | ICD-10-CM | POA: Diagnosis not present

## 2020-11-09 DIAGNOSIS — Z9641 Presence of insulin pump (external) (internal): Secondary | ICD-10-CM | POA: Diagnosis not present

## 2020-11-09 DIAGNOSIS — E039 Hypothyroidism, unspecified: Secondary | ICD-10-CM | POA: Diagnosis not present

## 2020-11-09 DIAGNOSIS — E109 Type 1 diabetes mellitus without complications: Secondary | ICD-10-CM | POA: Diagnosis not present

## 2020-11-09 DIAGNOSIS — E7800 Pure hypercholesterolemia, unspecified: Secondary | ICD-10-CM | POA: Diagnosis not present

## 2020-11-09 DIAGNOSIS — N1831 Chronic kidney disease, stage 3a: Secondary | ICD-10-CM | POA: Diagnosis not present

## 2020-11-15 ENCOUNTER — Ambulatory Visit (INDEPENDENT_AMBULATORY_CARE_PROVIDER_SITE_OTHER): Payer: Medicare Other | Admitting: Pulmonary Disease

## 2020-11-15 ENCOUNTER — Encounter: Payer: Self-pay | Admitting: Pulmonary Disease

## 2020-11-15 ENCOUNTER — Other Ambulatory Visit: Payer: Self-pay

## 2020-11-15 VITALS — BP 126/60 | HR 79 | Temp 97.0°F | Ht 67.0 in | Wt 159.6 lb

## 2020-11-15 DIAGNOSIS — J449 Chronic obstructive pulmonary disease, unspecified: Secondary | ICD-10-CM

## 2020-11-15 MED ORDER — ALPRAZOLAM 0.25 MG PO TABS: 0.2500 mg | ORAL_TABLET | Freq: Two times a day (BID) | ORAL | 0 refills | Status: AC | PRN

## 2020-11-15 MED ORDER — IPRATROPIUM-ALBUTEROL 0.5-2.5 (3) MG/3ML IN SOLN: 3.0000 mL | Freq: Four times a day (QID) | RESPIRATORY_TRACT | 11 refills | Status: DC | PRN

## 2020-11-15 MED ORDER — TRELEGY ELLIPTA 200-62.5-25 MCG/INH IN AEPB: 2.0000 | INHALATION_SPRAY | Freq: Every day | RESPIRATORY_TRACT | 5 refills | Status: DC

## 2020-11-15 NOTE — Patient Instructions (Addendum)
Continue the Trelegy inhaler We will prescribe nebulizer machine and duo nebs to be used as needed Order HRCT to be done as soon as possible for evaluation of dyspnea  Please contact your doctor and make sure that your thyroid function is okay There may be a component of anxiety driving your dyspnea.  Please check with your primary care about restarting medication such as Lexapro.  We will also give you a prescription for Xanax 0.25 mg to be used as needed when you have episodes of difficulty breathing.   Follow-up in 1 month.

## 2020-11-15 NOTE — Progress Notes (Signed)
April Long    [id number]    1945/11/27  Primary Care Physician:Prochnau, Chrys Racer, MD  Referring Physician: Ernestene Kiel, MD [place]. [place],   [address]  Chief complaint: Consult for dyspnea  HPI: 75 year old female former smoker who continues to have dyspnea on exertion. She has a PMHx of HTN, type I DM, hypothyroidism, CKD III, COPD/empysema. PFT's showed moderate COPD. She also has elevated peripheral eosinophils. CT chest from 11/21 showed mild bibasilar atelectasis. Evaluated by Dr. Harriet Masson, cardiology, with a normal echocardiogram and stress test. She was started on trelegy and switched to breztri during her last visit. Plan was to consider a high resolution CT if her symptoms do not improve.   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  No feather pillows or comforter Smoking history: 30-pack-year smoker.  Quit in 2020 Travel history: No significant travel history Relevant family history: No significant family history of lung disease  Interim history:  Patient was recently switched from Trelegy to [place], with no improvement. She resumed taking Trelegy. She states her symptoms have progressed. She normally feels okay in the mornings; however, by early afternoon she feels completely winded. She cannot complete her sentences nor get around her house without feeling SOB. She feels okay if she is at rest. She denies any fever, sinus pain/pressure, lightheadedness, or chest pain. She was recently seen in the [facility] ER again on 12/29 while visiting her cardiologist Dr. Harriet Masson. She reports she was so SOB he wheeled her down to the ER for further workup which did not yield any new findings. She is to return to pulmonary rehab today for another assessment. She could not complete her last session because she was to SOB.   Outpatient Encounter Medications as of 11/15/2020  Medication Sig  . albuterol (VENTOLIN HFA) 108 (90  Base) MCG/ACT inhaler Inhale 2 puffs into the lungs every 6 (six) hours as needed for wheezing or shortness of breath.  Marland Kitchen atorvastatin (LIPITOR) 10 MG tablet Take 10 mg by mouth daily.  Marland Kitchen buPROPion (WELLBUTRIN XL) 300 MG 24 hr tablet Take 1 tablet once a day for anxiety and mood  . busPIRone (BUSPAR) 10 MG tablet Take 10 mg by mouth daily. For anxiety  . Calcium Carbonate-Vitamin D 600-400 MG-UNIT tablet Take 1 tablet by mouth daily. With food  . CYANOCOBALAMIN-CAFFEINE PO [Gummy] Take as directed  . Fluticasone-Umeclidin-Vilant (TRELEGY ELLIPTA) 200-62.5-25 MCG/INH AEPB Inhale 2 puffs into the lungs daily.  . Insulin Aspart (NOVOLOG [place]) Inject into the skin. Insulin pump  . levothyroxine (SYNTHROID) 50 MCG tablet Take 1 tablet by mouth as directed.  Marland Kitchen lisinopril (PRINIVIL,ZESTRIL) 10 MG tablet Take 10 mg by mouth daily.  . Multiple Vitamin (MULTIVITAMIN) tablet Take 2 tablets by mouth daily.   Marland Kitchen VITAMIN D, CHOLECALCIFEROL, PO Take 1,000 Units by mouth.  . ZOLPIDEM TARTRATE PO Take by mouth.  . [DISCONTINUED] Budeson-Glycopyrrol-Formoterol (BREZTRI AEROSPHERE) 160-9-4.8 MCG/ACT AERO Inhale 2 puffs into the lungs in the morning and at bedtime.   No facility-administered encounter medications on file as of 11/15/2020.    Allergies as of 11/15/2020 - Review Complete 11/15/2020  Allergen Reaction Noted  . Sulfa drugs cross reactors Rash 02/17/2011    Past Medical History:  Diagnosis Date  . Abnormal gait 06/10/2018  . Acute pain of right wrist 08/20/2019  . Aphakia of left eye 07/19/2015  . 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 Cancer ([place])   . Carpal tunnel syndrome of right wrist 12/20/2019  . Cataract 09/26/2020  . Chronic venous insufficiency 02/05/2018  . Diabetes mellitus type 1 ([place]) 11/20/2016  . Hypercholesterolemia 11/20/2016  . Hypertension   . Hypothyroidism   . Insulin pump status 11/20/2016  . Lobular carcinoma in situ  11/29/2011  . 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 Long toenail 01/02/2016  . Nondiabetic proliferative retinopathy 09/18/2011  . Osteopenia of multiple sites 11/20/2016  . Osteoporosis    early stage  . PAD (peripheral artery disease) ([place]) 11/27/2017  . Pain in right foot 11/26/2017  . Pain of right thumb 11/04/2019  . Paresthesia of upper limb 06/24/2019  . Primary hypothyroidism 11/20/2016  . Pseudophakia of right eye 07/19/2015  . Right posterior capsular opacification 07/19/2015  . Slurring of speech 08/03/2013  . Stage 3a chronic kidney disease ([place]) 12/31/2018  . Syncope 08/03/2013  . Thyroid disease   . 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 BREAST LUMPECTOMY Left   . BREAST SURGERY  2012   lumpectomy  . EYE SURGERY  1983   retinal reattachment  . TUBAL LIGATION  1974    Family History  Problem Relation Age of Onset  . Alzheimer's disease Mother   . Heart disease Father   . Heart failure Father   . Breast cancer Neg Hx     Social History   Socioeconomic History  . Marital status: Married    Spouse name: Not on file  . Number of children: Not on file  . Years of education: Not on file  . Highest education level: Not on file  Occupational History  . Not on file  Tobacco Use  . Smoking status: Former Smoker    Quit date: 12/19/2000    Years since quitting: 19.9  . Smokeless tobacco: Never Used  Substance and Sexual Activity  . Alcohol use: Yes    Alcohol/week: 7.0 standard drinks    Types: 7 Glasses of wine per week    Comment: 1 -2 glasses of wine daily or weekly  . Drug use: No  . Sexual activity: Yes  Other Topics Concern  . Not on file  Social History Narrative  . Not on file   Social Determinants of Health   Financial Resource Strain: Not on file  Food Insecurity: Not on file  Transportation Needs: Not on file  Physical Activity: Not on file  Stress: Not on file  Social  Connections: Not on file  Intimate Partner Violence: Not on file    Review of systems: Review of Systems  Constitutional: Negative for fever and chills.  HENT: Negative.   Eyes: Negative for blurred vision.  Respiratory: as per HPI  Cardiovascular: Negative for chest pain and palpitations.  Gastrointestinal: Negative for vomiting, diarrhea, blood per rectum. Genitourinary: Negative for dysuria, urgency, frequency and hematuria.  Musculoskeletal: Negative for myalgias, back pain and joint pain.  Skin: Negative for itching and rash.  Neurological: Negative for dizziness, tremors, focal weakness, seizures and loss of consciousness.  Endo/Heme/Allergies: Negative for environmental allergies.  Psychiatric/Behavioral: Negative for depression, suicidal ideas and hallucinations.  All other systems reviewed and are negative.  Physical Exam: Blood pressure 110/72, pulse 68, temperature (!) 97.4 F (36.3 C), temperature source Oral, height 5\' 7"  (1.702 m), weight 158 lb (71.7 kg), SpO2 95 %. Gen:  No acute distress HEENT:  EOMI, sclera anicteric Neck:     No masses; no thyromegaly Lungs:    Clear to auscultation bilaterally; normal respiratory effort CV:         Regular rate and rhythm; no murmurs Abd:      + bowel sounds; soft, non-tender; no palpable masses, no distension Ext:    No edema; adequate peripheral perfusion Skin:      Warm and dry; no rash Neuro: alert and oriented x 3 Psych: normal mood and affect  Data Reviewed: Imaging: CT Chest 08/29/20 No PE, mild pulmonary atelectasis, atherosclerosis.   Chest radiograph 10/06/2020- Mild COPD  Chest radiograph 10/12/2020- Hyperinflated lungs, consistent with COPD Chest radiography 10/19/2020- COPD   I have reviewed the images personally.  PFTs: 10/04/2020 FVC 2.67 [81%], FEV1 1.69 [69%], F/F 63, TLC 4.88 [88%], DLCO 7.19 [25%] Moderate obstruction with severe diffusion defect  Labs: CBC 10/12/2020-WBC 7.7, eos 6%,  absolute eosinophil count 462  Cardiology 2021-echocardiogram done at [facility] shows overall left systolic function 60 to [id number].  Right ventricle is normal size.  Left atrium is normal size.  Right atrium is normal in size and function.  There is mild aortic valve sclerosis.  Trace mitral regurgitation is present.  Mild tricuspid regurgitation is present.  Pulmonary artery systolic pressure 22 mmHg.  Aortic arch, ascending aorta and aortic root are appears to be within normals.  08/30/2020 -pharmacologic nuclear stress test no evidence of infarction or ischemia.  Left ventricle ejection fraction 69.  With normal wall motion.  Assessment:  Evaluation for dyspnea PFTs reviewed with moderate COPD, likely from smoking.  Cardiac workup has been unremarkable  She will benefit from inhaled corticosteroids given elevated peripheral eosinophils. Started on trelegy and switched to [facility] [place] with no improvement. Suspicion for ILD/pulmonary fibrosis is low given normal lung volumes on PFTs.  Will get a high resolution chest CT since there has been little to no improvement thus far. May also need a pulmonary exercise test.    Plan/Recommendations: C/w Trelegy inhaler High resolution chest CT   Harlow Ohms, DO PGY-2 IM  [facility] 11/15/2020, 9:42 AM  CC: Ernestene Kiel, MD

## 2020-11-16 MED ORDER — IPRATROPIUM-ALBUTEROL 0.5-2.5 (3) MG/3ML IN SOLN: 3.0000 mL | Freq: Four times a day (QID) | RESPIRATORY_TRACT | 11 refills | Status: AC | PRN

## 2020-11-18 ENCOUNTER — Encounter: Payer: Self-pay | Admitting: Pulmonary Disease

## 2020-11-18 NOTE — Progress Notes (Signed)
Attending note: I have seen and examined the patient. History, labs and imaging reviewed. Agree with assessment and plan as noted by Dr. Letitia Libra MD [facility] 11/18/2020, 1:43 PM

## 2020-11-23 DIAGNOSIS — J449 Chronic obstructive pulmonary disease, unspecified: Secondary | ICD-10-CM | POA: Diagnosis not present

## 2020-11-29 ENCOUNTER — Ambulatory Visit: Payer: Medicare Other | Admitting: Primary Care

## 2020-11-29 NOTE — Telephone Encounter (Signed)
Called and spoke with pharmacy who states that  The issue is that the RX that was sent in says 2 puffs daily and the recommended done is 1 puff. New RX can be sent in and you can call the [facility] team- [phone]  Ref #: [id number]  Dr. Vaughan Browner please advise if patient is supposed to be doing 1 puff or 2 daily and we can send in new RX

## 2020-11-30 ENCOUNTER — Other Ambulatory Visit: Payer: Self-pay

## 2020-11-30 ENCOUNTER — Ambulatory Visit
Admission: RE | Admit: 2020-11-30 | Discharge: 2020-11-30 | Disposition: A | Discharge status: Home / Self Care | Payer: Medicare Other | Source: Ambulatory Visit | Attending: Pulmonary Disease | Admitting: Pulmonary Disease

## 2020-11-30 ENCOUNTER — Telehealth: Payer: Self-pay | Admitting: Pulmonary Disease

## 2020-11-30 DIAGNOSIS — Z9641 Presence of insulin pump (external) (internal): Secondary | ICD-10-CM | POA: Diagnosis not present

## 2020-11-30 DIAGNOSIS — I872 Venous insufficiency (chronic) (peripheral): Secondary | ICD-10-CM | POA: Diagnosis not present

## 2020-11-30 DIAGNOSIS — Z978 Presence of other specified devices: Secondary | ICD-10-CM | POA: Diagnosis not present

## 2020-11-30 DIAGNOSIS — E039 Hypothyroidism, unspecified: Secondary | ICD-10-CM | POA: Diagnosis not present

## 2020-11-30 DIAGNOSIS — E7800 Pure hypercholesterolemia, unspecified: Secondary | ICD-10-CM | POA: Diagnosis not present

## 2020-11-30 DIAGNOSIS — N1831 Chronic kidney disease, stage 3a: Secondary | ICD-10-CM | POA: Diagnosis not present

## 2020-11-30 DIAGNOSIS — E109 Type 1 diabetes mellitus without complications: Secondary | ICD-10-CM | POA: Diagnosis not present

## 2020-11-30 DIAGNOSIS — E1022 Type 1 diabetes mellitus with diabetic chronic kidney disease: Secondary | ICD-10-CM | POA: Diagnosis not present

## 2020-11-30 DIAGNOSIS — M8589 Other specified disorders of bone density and structure, multiple sites: Secondary | ICD-10-CM | POA: Diagnosis not present

## 2020-11-30 DIAGNOSIS — J449 Chronic obstructive pulmonary disease, unspecified: Secondary | ICD-10-CM

## 2020-11-30 DIAGNOSIS — E103599 Type 1 diabetes mellitus with proliferative diabetic retinopathy without macular edema, unspecified eye: Secondary | ICD-10-CM | POA: Diagnosis not present

## 2020-11-30 DIAGNOSIS — I739 Peripheral vascular disease, unspecified: Secondary | ICD-10-CM | POA: Diagnosis not present

## 2020-11-30 DIAGNOSIS — R0602 Shortness of breath: Secondary | ICD-10-CM | POA: Diagnosis not present

## 2020-11-30 MED ORDER — TRELEGY ELLIPTA 200-62.5-25 MCG/INH IN AEPB: 1.0000 | INHALATION_SPRAY | Freq: Every day | RESPIRATORY_TRACT | 6 refills | Status: AC

## 2020-11-30 NOTE — Telephone Encounter (Signed)
I called and discussed with patient next CT high-resolution 2/9 with no significant interstitial lung disease.  There is mild nonspecific scarring at the left base No explanation for patient's symptoms.  She has had previously been ruled out for PE, cardiac work-up was negative.  She has had multiple ED visits in [facility] [place] already She has not responded to inhalers or steroids for treatment of COPD  She is well during the daytime but symptoms start in the afternoon, evening.  She does endorse anxiety On telephone she appears markedly short of breath with fast respiration.  Suspect anxiety, hyperventilation, possible vocal cord dysfunction She is using Xanax.  Advised deep breathing exercises  Please make appointment for 9:45 AM on 2/14 with me.  I have already discussed with patient and told her about appointment.Marland Kitchen

## 2020-11-30 NOTE — Telephone Encounter (Signed)
Called and spoke with pharmacy who states that  The issue is that the RX that was sent in says 2 puffs daily and the recommended done is 1 puff. New RX can be sent in and you can call the [facility] team- [phone]  Ref #: [id number]  Dr. Vaughan Browner please advise if patient is supposed to be doing 1 puff or 2 daily and we can send in new RX

## 2020-11-30 NOTE — Telephone Encounter (Signed)
Please advise on patient mychart message  Please advise as to the results of the High Resolution CT scan that was done today. We really need to come back into see the doctor asap. The labored breathing is worsening.   IMPRESSION: 1. There is minimal irregular peripheral interstitial opacity in the left greater than right lung bases without significant non dependent findings. Findings are most consistent with sequelae of prior infection or aspiration without specific findings to suggest fibrotic interstitial lung disease. If characterized by ATS pulmonary fibrosis criteria, these findings are in an early "indeterminate for UIP" pattern. Annual CT follow-up can be considered if there is high ongoing clinical concern for fibrotic interstitial lung disease. 2. Coronary artery disease. 3. Aortic valve calcifications. Correlate for echocardiographic evidence of aortic valve dysfunction.  Aortic Atherosclerosis (ICD10-I70.0).   Electronically Signed   By: Eddie Candle M.D.   On: 11/30/2020 14:00

## 2020-11-30 NOTE — Telephone Encounter (Signed)
Appointment has been made for patient. Nothing further needed at this time.  Next Appt With Pulmonology Marshell Garfinkel, MD) 12/05/2020 at 9:45 AM

## 2020-11-30 NOTE — Addendum Note (Signed)
Addended by: Lia Foyer R on: 11/30/2020 11:57 AM   Modules accepted: Orders

## 2020-11-30 NOTE — Telephone Encounter (Signed)
New Rx has been sent in for patient after talking to Dr. Vaughan Browner. Patient is to do 1 puff of Trelegy not 2. Patient has been informed nothing further needed at this time.

## 2020-11-30 NOTE — Telephone Encounter (Signed)
Marshell Garfinkel, MD     11/30/20 4:02 PM Note I called and discussed with patient next CT high-resolution 2/9 with no significant interstitial lung disease.  There is mild nonspecific scarring at the left base No explanation for patient's symptoms.  She has had previously been ruled out for PE, cardiac work-up was negative.  She has had multiple ED visits in [place] already She has not responded to inhalers or steroids for treatment of COPD  She is well during the daytime but symptoms start in the afternoon, evening.  She does endorse anxiety On telephone she appears markedly short of breath with fast respiration.  Suspect anxiety, hyperventilation, possible vocal cord dysfunction She is using Xanax.  Advised deep breathing exercises  Please make appointment for 9:45 AM on 2/14 with me.  I have already discussed with patient and told her about appointment.Marland Kitchen     Appointment has been made for patient. Nothing further needed at this time.  Next Appt With Pulmonology Marshell Garfinkel, MD) 12/05/2020 at 9:45 AM

## 2020-12-01 ENCOUNTER — Encounter (INDEPENDENT_AMBULATORY_CARE_PROVIDER_SITE_OTHER): Payer: Medicare Other | Admitting: Ophthalmology

## 2020-12-05 ENCOUNTER — Encounter: Payer: Self-pay | Admitting: Pulmonary Disease

## 2020-12-05 ENCOUNTER — Other Ambulatory Visit: Payer: Self-pay

## 2020-12-05 ENCOUNTER — Ambulatory Visit (INDEPENDENT_AMBULATORY_CARE_PROVIDER_SITE_OTHER): Payer: Medicare Other | Admitting: Pulmonary Disease

## 2020-12-05 VITALS — BP 116/72 | HR 77 | Temp 97.5°F | Ht 67.0 in | Wt 159.8 lb

## 2020-12-05 DIAGNOSIS — J383 Other diseases of vocal cords: Secondary | ICD-10-CM

## 2020-12-05 DIAGNOSIS — R0600 Dyspnea, unspecified: Secondary | ICD-10-CM

## 2020-12-05 DIAGNOSIS — R0609 Other forms of dyspnea: Secondary | ICD-10-CM

## 2020-12-05 NOTE — Patient Instructions (Signed)
I have reviewed your CT scan which does not show significant interstitial lung disease Agree with holding the lisinopril Use nebulizers as needed for shortness of breath  We will give you a handout with instructions on slowing down the breathing We will make an urgent referral to ENT and speech pathology for evaluation of vocal cord dysfunction Referral for cardiopulmonary exercise test for evaluation of exercise-induced bronchospasm and dyspnea  Follow-up in 3 months.

## 2020-12-05 NOTE — Addendum Note (Signed)
Addended by: Valerie Salts on: 12/05/2020 12:23 PM   Modules accepted: Orders

## 2020-12-05 NOTE — Progress Notes (Signed)
April Long    [id number]    02-03-1946  Primary Care Physician:Prochnau, Chrys Racer, MD  Referring Physician: Ernestene Kiel, MD [place]. [place],  [place] [address]  Chief complaint: Follow-up for dyspnea  HPI: 75 year old female former smoker who continues to have dyspnea on exertion. She has a PMHx of HTN, type I DM, hypothyroidism, CKD III, COPD/empysema. PFT's showed moderate COPD. She also has elevated peripheral eosinophils. CT chest from 11/21 showed mild bibasilar atelectasis. Evaluated by Dr. Harriet Masson, cardiology, with a normal echocardiogram and stress test. She was started on trelegy and switched to breztri during her last visit. Plan was to consider a high resolution CT if her symptoms do not improve.   Breztri did not help and she went back to trelegy. Evaluated again at [facility] ED on 12/29 with no significant findings.  She was not admitted  Follows with Noemi Chapel, APMHNP-BC at [facility] for depression, anxiety.  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  No feather pillows or comforter Smoking history: 30-pack-year smoker.  Quit in 2020 Travel history: No significant travel history Relevant family history: No significant family history of lung disease  Interim history:  Continues to have significant symptoms She normally feels okay in the mornings; however, by early afternoon she feels completely winded. She cannot complete her sentences nor get around her house without feeling SOB. She feels okay if she is at rest. She denies any fever, sinus pain/pressure, lightheadedness, or chest pain.  Over the past few weeks she has noted dysphonia, voice changes  She is attending pulmonary rehab.  She has stopped breztri inhaler as it was not helping with her breathing. We tried Xanax low-dose at 0.25 mg for hyperventilation and anxiety.  She took 2 doses so far and does not note any difference She  has followed up with her endocrinologist at [facility] with normal TSH levels.  Concern was raised for vocal cord dysfunction and ENT eval recommended. Lisinopril was stopped as it may contribute to vocal cord issues.  Outpatient Encounter Medications as of 12/05/2020  Medication Sig  . albuterol (VENTOLIN HFA) 108 (90 Base) MCG/ACT inhaler Inhale 2 puffs into the lungs every 6 (six) hours as needed for wheezing or shortness of breath.  . ALPRAZolam (XANAX) 0.25 MG tablet Take 1 tablet (0.25 mg total) by mouth 2 (two) times daily as needed for anxiety.  Marland Kitchen atorvastatin (LIPITOR) 10 MG tablet Take 10 mg by mouth daily.  Marland Kitchen buPROPion (WELLBUTRIN XL) 300 MG 24 hr tablet Take 1 tablet once a day for anxiety and mood  . busPIRone (BUSPAR) 10 MG tablet Take 10 mg by mouth daily. For anxiety  . Calcium Carbonate-Vitamin D 600-400 MG-UNIT tablet Take 1 tablet by mouth daily. With food  . CYANOCOBALAMIN-CAFFEINE PO [Gummy] Take as directed  . Fluticasone-Umeclidin-Vilant (TRELEGY ELLIPTA) 200-62.5-25 MCG/INH AEPB Inhale 1 puff into the lungs daily.  . Insulin Aspart (NOVOLOG ) Inject into the skin. Insulin pump  . ipratropium-albuterol (DUONEB) 0.5-2.5 (3) MG/3ML SOLN Take 3 mLs by nebulization every 6 (six) hours as needed.  Marland Kitchen levothyroxine (SYNTHROID) 50 MCG tablet Take 1 tablet by mouth as directed.  . Multiple Vitamin (MULTIVITAMIN) tablet Take 2 tablets by mouth daily.   Marland Kitchen VITAMIN D, CHOLECALCIFEROL, PO Take 1,000 Units by mouth.  . ZOLPIDEM TARTRATE PO Take by mouth.  . [DISCONTINUED] lisinopril (PRINIVIL,ZESTRIL) 10 MG tablet Take 10  mg by mouth daily.   No facility-administered encounter medications on file as of 12/05/2020.   Physical Exam: Blood pressure 110/72, pulse 68, temperature (!) 97.4 F (36.3 C), temperature source Oral, height 5\' 7"  (1.702 m), weight 158 lb (71.7 kg), SpO2 95 %. Gen:      No acute distress HEENT:  EOMI, sclera anicteric Neck:     No masses; no  thyromegaly Lungs:    Clear to auscultation bilaterally; normal respiratory effort CV:         Regular rate and rhythm; no murmurs Abd:      + bowel sounds; soft, non-tender; no palpable masses, no distension Ext:    No edema; adequate peripheral perfusion Skin:      Warm and dry; no rash Neuro: alert and oriented x 3 Psych: normal mood and affect  Data Reviewed: Imaging: CT Chest 08/29/20 No PE, mild pulmonary atelectasis, atherosclerosis.   Chest radiograph 10/06/2020- Mild COPD  Chest radiograph 10/12/2020- Hyperinflated lungs, consistent with COPD Chest radiography 10/19/2020- COPD   High-resolution CT 11/30/2020-minimal peripheral opacity in the left base.  No significant evidence of interstitial lung disease, coronary artery disease, aortic wall calcification.  I have reviewed the images personally.  PFTs: 10/04/2020 FVC 2.67 [81%], FEV1 1.69 [69%], F/F 63, TLC 4.88 [88%], DLCO 7.19 [25%] Moderate obstruction with severe diffusion defect  Labs: CBC 10/12/2020-WBC 7.7, eos 6%, absolute eosinophil count 462  Cardiology 2021-[facility] at [facility] shows overall left systolic function 60 to 10%.  Right ventricle is normal size.  Left atrium is normal size.  Right atrium is normal in size and function.  There is mild aortic valve sclerosis.  Trace mitral regurgitation is present.  Mild tricuspid regurgitation is present.  Pulmonary artery systolic pressure 22 mmHg.  Aortic arch, ascending aorta and aortic root are appears to be within normals.  08/30/2020 -pharmacologic nuclear stress test no evidence of infarction or ischemia.  Left ventricle ejection fraction 69.  With normal wall motion.  Assessment:  Evaluation for dyspnea PFTs reviewed with moderate COPD, likely from smoking.  However she is not responded to inhalers or steroids Cardiac workup has been unremarkable with no cardiomyopathy or pulmonary hypertension, PE ruled out with CTA in November 2021,  high-res CT with no interstitial lung disease  As per the family she had an abnormal ABG with pH 7.66, PCO2 less than 20, PO2 120  during her visit to [facility] ER Suspect if there is a component of anxiety given the ABG findings of hyperventilation.  Agree that she needs evaluation for vocal cord dysfunction with referral to ENT and speech pathology I have advised her to follow-up with her psychiatry to see if medications for anxiety and depression can be adjusted In the meantime we can observe off inhalers as it has not helped and may be making her voice symptoms worse  Discussed breathing techniques and handout for [place] breathing were given to help with hyperventilation We will also place an order for cardiopulmonary exercise test for work-up of dyspnea, possible exercise-induced bronchospasm.  Plan/Recommendations: Agree with holding lisinopril Okay to observe off inhalers, use nebs as needed Breathing technique ENT, speech pathology referral Cardiopulmonary exercise test.  Marshell Garfinkel MD [facility] See Amion for pager  If no response to pager , please call [phone] until 7pm After 7:00 pm call Elink  [phone] 12/05/2020, 12:19 PM   CC: Ernestene Kiel, MD

## 2020-12-09 DIAGNOSIS — J3489 Other specified disorders of nose and nasal sinuses: Secondary | ICD-10-CM | POA: Diagnosis not present

## 2020-12-09 DIAGNOSIS — R0602 Shortness of breath: Secondary | ICD-10-CM | POA: Diagnosis not present

## 2020-12-13 ENCOUNTER — Ambulatory Visit: Payer: Medicare Other | Admitting: Pulmonary Disease

## 2020-12-14 DIAGNOSIS — R499 Unspecified voice and resonance disorder: Secondary | ICD-10-CM

## 2020-12-14 NOTE — Telephone Encounter (Signed)
Referral placed.

## 2020-12-14 NOTE — Telephone Encounter (Signed)
Please advise on patient mychart message  I went to the ENT appointment that you referred me to, but he could not find anything wrong. He suggested that I see a neurologist and said he would you send your office the notes about that. Please make an urgent referral to [facility] for me to see them.

## 2020-12-14 NOTE — Telephone Encounter (Signed)
Ok. Please make a referral to [facility] for evaluation of voice changes. Eval for neuromuscular weakness  Prefer afternoon appointment

## 2020-12-15 ENCOUNTER — Encounter: Payer: Self-pay | Admitting: Neurology

## 2020-12-22 ENCOUNTER — Ambulatory Visit (INDEPENDENT_AMBULATORY_CARE_PROVIDER_SITE_OTHER): Payer: Medicare Other | Admitting: Neurology

## 2020-12-22 ENCOUNTER — Encounter: Payer: Self-pay | Admitting: Neurology

## 2020-12-22 ENCOUNTER — Encounter (INDEPENDENT_AMBULATORY_CARE_PROVIDER_SITE_OTHER): Payer: Medicare Other | Admitting: Ophthalmology

## 2020-12-22 ENCOUNTER — Other Ambulatory Visit: Payer: Self-pay

## 2020-12-22 VITALS — BP 158/63 | HR 79 | Ht 67.0 in | Wt 154.4 lb

## 2020-12-22 DIAGNOSIS — R479 Unspecified speech disturbances: Secondary | ICD-10-CM

## 2020-12-22 DIAGNOSIS — F8081 Childhood onset fluency disorder: Secondary | ICD-10-CM

## 2020-12-22 NOTE — Patient Instructions (Signed)
Please work with your psychologist about anxiety  Call my office if you would like to get an MRI brain

## 2020-12-22 NOTE — Progress Notes (Signed)
[facility] Clinic Note - Initial Visit   Date: 12/22/20  April Long MRN: [id number] DOB: 12-21-1945   Dear Dr.Mannam:  Thank you for your kind referral of April Long for consultation of speech change. Although her history is well known to you, please allow [place] to reiterate it for the purpose of our medical record. The patient was accompanied to the clinic by husband (daughter available on phone) who also provides collateral information.     History of Present Illness: April Long is a 75 y.o. right-handed female with diabetic mellitus, hypertension, COPD, hypothyroidism, OSA on CPAP, PAD, and anxiety presenting for evaluation of speech change.  Starting around December 2021, she began having shortness of breath.  Cardiology evaluation was unremarkable, so she was referred to pulmonology after PFTs shows mild-moderate COPD.  She has been tried on a number of medications whichout any benefit.  Around the same time, she began having speech disturbance, such that she cannot speak in full sentences, because of shortness of breath.  She was evaluated by ENT due to concern of vocal cord dysfunction and work-up was normal.  She is here for neuromuscular evaluation of her symptoms.   Of note, husband states that her speech is normal in the morning and can vary throughout the day, but it is not constant.  She does not have slurred speech, difficulty swallowing, arm/leg weakness, muscle twitches. She endorses anxiety.  During history taking, patient has several moments where she was talking clearly in full sentences, without stammer or shortness of breath.  Her breathing was also more relaxed and normal.   Out-side paper records, electronic medical record, and images have been reviewed where available and summarized as:   Lab Results  Component Value Date   TSH 0.381 12/04/2010   Lab Results  Component Value Date   ESRSEDRATE 2 08/03/2013    Past Medical  History:  Diagnosis Date  . Abnormal gait 06/10/2018  . Acute pain of right wrist 08/20/2019  . Aphakia of left eye 07/19/2015  . 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 Cancer ([place])   . Carpal tunnel syndrome of right wrist 12/20/2019  . Cataract 09/26/2020  . Chronic venous insufficiency 02/05/2018  . Diabetes mellitus type 1 ([place]) 11/20/2016  . Hypercholesterolemia 11/20/2016  . Hypertension   . Hypothyroidism   . Insulin pump status 11/20/2016  . 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 Long toenail 01/02/2016  . Nondiabetic proliferative retinopathy 09/18/2011  . Osteopenia of multiple sites 11/20/2016  . Osteoporosis    early stage  . PAD (peripheral artery disease) ([place]) 11/27/2017  . Pain in right foot 11/26/2017  . Pain of right thumb 11/04/2019  . Paresthesia of upper limb 06/24/2019  . Primary hypothyroidism 11/20/2016  . Pseudophakia of right eye 07/19/2015  . Right posterior capsular opacification 07/19/2015  . Slurring of speech 08/03/2013  . Stage 3a chronic kidney disease ([place]) 12/31/2018  . Syncope 08/03/2013  . Thyroid disease   . 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 BREAST LUMPECTOMY Left   . BREAST SURGERY  2012   lumpectomy  . EYE SURGERY  1983   retinal reattachment  . TUBAL LIGATION  1974     Medications:  Outpatient Encounter Medications as of 12/22/2020  Medication Sig  . ALPRAZolam (XANAX) 0.25  MG tablet Take 1 tablet (0.25 mg total) by mouth 2 (two) times daily as needed for anxiety.  Marland Kitchen atorvastatin (LIPITOR) 10 MG tablet Take 10 mg by mouth daily.  Marland Kitchen buPROPion (WELLBUTRIN XL) 300 MG 24 hr tablet Take 1 tablet once a day for anxiety and mood  . Calcium Carbonate-Vitamin D 600-400 MG-UNIT tablet Take 1 tablet by mouth daily. With food  . CYANOCOBALAMIN-CAFFEINE PO [Gummy] Take as directed  .  Fluticasone-Umeclidin-Vilant (TRELEGY ELLIPTA) 200-62.5-25 MCG/INH AEPB Inhale 1 puff into the lungs daily.  . Insulin Aspart (NOVOLOG [place]) Inject into the skin. Insulin pump  . ipratropium-albuterol (DUONEB) 0.5-2.5 (3) MG/3ML SOLN Take 3 mLs by nebulization every 6 (six) hours as needed.  Marland Kitchen levothyroxine (SYNTHROID) 50 MCG tablet Take 1 tablet by mouth as directed.  . Multiple Vitamin (MULTIVITAMIN) tablet Take 2 tablets by mouth daily.   Marland Kitchen VITAMIN D, CHOLECALCIFEROL, PO Take 1,000 Units by mouth.  . ZOLPIDEM TARTRATE PO Take by mouth.  Marland Kitchen albuterol (VENTOLIN HFA) 108 (90 Base) MCG/ACT inhaler Inhale 2 puffs into the lungs every 6 (six) hours as needed for wheezing or shortness of breath. (Patient not taking: Reported on 12/22/2020)  . [DISCONTINUED] busPIRone (BUSPAR) 10 MG tablet Take 10 mg by mouth daily. For anxiety   No facility-administered encounter medications on file as of 12/22/2020.    Allergies:  Allergies  Allergen Reactions  . Sulfa Drugs Cross Reactors Rash    On chest only    Family History: Family History  Problem Relation Age of Onset  . Alzheimer's disease Mother   . Heart disease Father   . Heart failure Father   . Breast cancer Neg Hx     Social History: Social History   Tobacco Use  . Smoking status: Former Smoker    Quit date: 12/19/2000    Years since quitting: 20.0  . Smokeless tobacco: Never Used  Vaping Use  . Vaping Use: Never used  Substance Use Topics  . Alcohol use: Yes    Alcohol/week: 7.0 standard drinks    Types: 7 Glasses of wine per week    Comment: 1 -2 glasses of wine daily or weekly  . Drug use: No   Social History   Social History Narrative   Right handed    lives with husband    Vital Signs:  BP (!) 158/63   Pulse 79   Ht 5\' 7"  (1.702 m)   Wt 154 lb 6.4 oz (70 kg)   SpO2 98%   BMI 24.18 kg/m   Neurological Exam: MENTAL STATUS including orientation to time, place, person, recent and remote memory, attention span and  concentration, language, and fund of knowledge is normal.  She has an irregular stammer which is broken by a quick and shallow inhalation when speaking after each word, there is no dysarthria.  When patient is distracted, speech is clear and normal.  She can easily enunciate gutteral and lingual sounds "ka, ga, ma, pa" for > 15 seconds without interruption.    CRANIAL NERVES: II:  No visual field defects.   III-IV-VI: Pupils equal round and reactive to light, surgical left eye.  Normal conjugate, extra-ocular eye movements in all directions of gaze.  No nystagmus.  No ptosis.   V:  Normal facial sensation.    VII:  Normal facial symmetry and movements.   VIII:  Normal hearing and vestibular function.   IX-X:  Normal palatal movement.   XI:  Normal shoulder shrug and head rotation.  XII:  Normal tongue strength and range of motion, no deviation or fasciculation.  MOTOR:  No atrophy, fasciculations or abnormal movements.  No pronator drift.   Upper Extremity:  Right  Left  Deltoid  5/5   5/5   Biceps  5/5   5/5   Triceps  5/5   5/5   Infraspinatus 5/5  5/5  Medial pectoralis 5/5  5/5  Wrist extensors  5/5   5/5   Wrist flexors  5/5   5/5   Finger extensors  5/5   5/5   Finger flexors  5/5   5/5   Dorsal interossei  5/5   5/5   Abductor pollicis  5/5   5/5   Tone (Ashworth scale)  0  0   Lower Extremity:  Right  Left  Hip flexors  5/5   5/5   Hip extensors  5/5   5/5   Adductor 5/5  5/5  Abductor 5/5  5/5  Knee flexors  5/5   5/5   Knee extensors  5/5   5/5   Dorsiflexors  5/5   5/5   Plantarflexors  5/5   5/5   Toe extensors  5/5   5/5   Toe flexors  5/5   5/5   Tone (Ashworth scale)  0  0   MSRs:  Right        Left                  brachioradialis 2+  2+  biceps 2+  2+  triceps 2+  2+  patellar 2+  2+  ankle jerk 2+  2+  Hoffman no  no  plantar response down  down   SENSORY:  Normal and symmetric perception of light touch, pinprick, vibration, and proprioception.   Romberg's sign absent.   COORDINATION/GAIT: Normal finger-to- nose-finger.  Intact rapid alternating movements bilaterally.  Able to rise from a chair without using arms.  Gait narrow based and stable. Tandem and stressed gait intact.    IMPRESSION: Psychogenic speech.  Speech pattern is very irregular and inconsistent, no dysarthria or bulbar weakness makes primary neuromuscular cause for her symptoms very unlikely.  Given that there were multiple times during the interview that her speech was entirely normal, especially when she make a conscious effort to be calm, symptoms are clearly anxiety-driven.  She also endorses significant anxiety and is seeing a psychologist, which I encouraged.  I do not see any abnormalities to indicate neurodegenerative condition such as ALS, MS, or myasthenia gravis.  Although testing such as NCS/EMG and MRI brain can be ordered, overall yield is very low.  Family and patient appreciative of the visit and opted to hold on testing until they have tried working on getting her anxiety under better control.  Total time spent reviewing records, interview, history/exam, documentation, counseling and coordination of care on day of encounter:  50 min   Thank you for allowing me to participate in patient's care.  If I can answer any additional questions, I would be pleased to do so.    Sincerely,    Alain Deschene K. Posey Pronto, DO

## 2020-12-23 ENCOUNTER — Encounter (INDEPENDENT_AMBULATORY_CARE_PROVIDER_SITE_OTHER): Payer: Medicare Other | Admitting: Ophthalmology

## 2020-12-23 DIAGNOSIS — I10 Essential (primary) hypertension: Secondary | ICD-10-CM

## 2020-12-23 DIAGNOSIS — H35371 Puckering of macula, right eye: Secondary | ICD-10-CM | POA: Diagnosis not present

## 2020-12-23 DIAGNOSIS — E103531 Type 1 diabetes mellitus with proliferative diabetic retinopathy with traction retinal detachment not involving the macula, right eye: Secondary | ICD-10-CM | POA: Diagnosis not present

## 2020-12-23 DIAGNOSIS — E103522 Type 1 diabetes mellitus with proliferative diabetic retinopathy with traction retinal detachment involving the macula, left eye: Secondary | ICD-10-CM | POA: Diagnosis not present

## 2020-12-23 DIAGNOSIS — H43813 Vitreous degeneration, bilateral: Secondary | ICD-10-CM | POA: Diagnosis not present

## 2020-12-23 DIAGNOSIS — H35033 Hypertensive retinopathy, bilateral: Secondary | ICD-10-CM | POA: Diagnosis not present

## 2020-12-26 DIAGNOSIS — M79661 Pain in right lower leg: Secondary | ICD-10-CM | POA: Diagnosis not present

## 2020-12-26 DIAGNOSIS — M79662 Pain in left lower leg: Secondary | ICD-10-CM | POA: Diagnosis not present

## 2020-12-26 DIAGNOSIS — J449 Chronic obstructive pulmonary disease, unspecified: Secondary | ICD-10-CM | POA: Diagnosis not present

## 2020-12-29 DIAGNOSIS — F411 Generalized anxiety disorder: Secondary | ICD-10-CM | POA: Diagnosis not present

## 2020-12-29 DIAGNOSIS — F3342 Major depressive disorder, recurrent, in full remission: Secondary | ICD-10-CM | POA: Diagnosis not present

## 2021-01-03 ENCOUNTER — Other Ambulatory Visit: Payer: Self-pay

## 2021-01-03 ENCOUNTER — Ambulatory Visit (HOSPITAL_COMMUNITY): Payer: Medicare Other | Attending: Pulmonary Disease

## 2021-01-03 DIAGNOSIS — R0600 Dyspnea, unspecified: Secondary | ICD-10-CM | POA: Diagnosis not present

## 2021-01-03 DIAGNOSIS — R0609 Other forms of dyspnea: Secondary | ICD-10-CM

## 2021-01-03 NOTE — Telephone Encounter (Signed)
mychart message sent by pt which is posted below. Please advise:  To: [facility] P[facility] POOL    From: [place]: 01/03/2021 4:18 PM     *-*-*This message was handled on 01/03/2021 4:22 PM by Tamala Julian, JENNIFER L*-*-*  I did the exercise test today and would really like to know the results.

## 2021-01-04 NOTE — Telephone Encounter (Signed)
I have reviewed the CPET  It does not show any significant abnormality.  Interpretation is limited as she did not exercise to full capacity There are signs that she may be hyperventilating which means breathing fast which is what we discussed during the clinic visit.  The no significant cardiac or lung issues identified in the study.

## 2021-01-05 DIAGNOSIS — J449 Chronic obstructive pulmonary disease, unspecified: Secondary | ICD-10-CM | POA: Diagnosis not present

## 2021-01-05 DIAGNOSIS — R4782 Fluency disorder in conditions classified elsewhere: Secondary | ICD-10-CM | POA: Diagnosis not present

## 2021-01-05 DIAGNOSIS — R0600 Dyspnea, unspecified: Secondary | ICD-10-CM | POA: Diagnosis not present

## 2021-01-05 DIAGNOSIS — J8489 Other specified interstitial pulmonary diseases: Secondary | ICD-10-CM | POA: Diagnosis not present

## 2021-01-05 DIAGNOSIS — R471 Dysarthria and anarthria: Secondary | ICD-10-CM | POA: Diagnosis not present

## 2021-01-20 DIAGNOSIS — R2689 Other abnormalities of gait and mobility: Secondary | ICD-10-CM | POA: Diagnosis not present

## 2021-01-20 DIAGNOSIS — E1122 Type 2 diabetes mellitus with diabetic chronic kidney disease: Secondary | ICD-10-CM | POA: Diagnosis not present

## 2021-01-20 DIAGNOSIS — I4891 Unspecified atrial fibrillation: Secondary | ICD-10-CM | POA: Diagnosis not present

## 2021-01-20 DIAGNOSIS — E785 Hyperlipidemia, unspecified: Secondary | ICD-10-CM | POA: Diagnosis not present

## 2021-01-20 DIAGNOSIS — R4782 Fluency disorder in conditions classified elsewhere: Secondary | ICD-10-CM | POA: Diagnosis not present

## 2021-01-20 DIAGNOSIS — R4189 Other symptoms and signs involving cognitive functions and awareness: Secondary | ICD-10-CM | POA: Diagnosis not present

## 2021-01-20 DIAGNOSIS — R471 Dysarthria and anarthria: Secondary | ICD-10-CM | POA: Diagnosis not present

## 2021-01-20 DIAGNOSIS — Z8673 Personal history of transient ischemic attack (TIA), and cerebral infarction without residual deficits: Secondary | ICD-10-CM | POA: Diagnosis not present

## 2021-01-20 DIAGNOSIS — R479 Unspecified speech disturbances: Secondary | ICD-10-CM | POA: Diagnosis not present

## 2021-01-20 DIAGNOSIS — Z7982 Long term (current) use of aspirin: Secondary | ICD-10-CM | POA: Diagnosis not present

## 2021-01-20 DIAGNOSIS — J449 Chronic obstructive pulmonary disease, unspecified: Secondary | ICD-10-CM | POA: Diagnosis not present

## 2021-01-20 DIAGNOSIS — N189 Chronic kidney disease, unspecified: Secondary | ICD-10-CM | POA: Diagnosis not present

## 2021-01-20 DIAGNOSIS — Z87891 Personal history of nicotine dependence: Secondary | ICD-10-CM | POA: Diagnosis not present

## 2021-01-20 DIAGNOSIS — R4789 Other speech disturbances: Secondary | ICD-10-CM | POA: Diagnosis not present

## 2021-01-23 DIAGNOSIS — J449 Chronic obstructive pulmonary disease, unspecified: Secondary | ICD-10-CM | POA: Diagnosis not present

## 2021-01-26 DIAGNOSIS — I63423 Cerebral infarction due to embolism of bilateral anterior cerebral arteries: Secondary | ICD-10-CM | POA: Diagnosis not present

## 2021-01-26 DIAGNOSIS — I639 Cerebral infarction, unspecified: Secondary | ICD-10-CM | POA: Diagnosis not present

## 2021-01-31 DIAGNOSIS — M6281 Muscle weakness (generalized): Secondary | ICD-10-CM | POA: Diagnosis not present

## 2021-01-31 DIAGNOSIS — R2689 Other abnormalities of gait and mobility: Secondary | ICD-10-CM | POA: Diagnosis not present

## 2021-01-31 DIAGNOSIS — R4789 Other speech disturbances: Secondary | ICD-10-CM | POA: Diagnosis not present

## 2021-01-31 DIAGNOSIS — Z8673 Personal history of transient ischemic attack (TIA), and cerebral infarction without residual deficits: Secondary | ICD-10-CM | POA: Diagnosis not present

## 2021-01-31 DIAGNOSIS — R278 Other lack of coordination: Secondary | ICD-10-CM | POA: Diagnosis not present

## 2021-01-31 DIAGNOSIS — R2681 Unsteadiness on feet: Secondary | ICD-10-CM | POA: Diagnosis not present

## 2021-02-07 DIAGNOSIS — F4323 Adjustment disorder with mixed anxiety and depressed mood: Secondary | ICD-10-CM | POA: Diagnosis not present

## 2021-02-22 DIAGNOSIS — R278 Other lack of coordination: Secondary | ICD-10-CM | POA: Diagnosis not present

## 2021-02-22 DIAGNOSIS — M6281 Muscle weakness (generalized): Secondary | ICD-10-CM | POA: Diagnosis not present

## 2021-02-22 DIAGNOSIS — R4789 Other speech disturbances: Secondary | ICD-10-CM | POA: Diagnosis not present

## 2021-02-22 DIAGNOSIS — Z8673 Personal history of transient ischemic attack (TIA), and cerebral infarction without residual deficits: Secondary | ICD-10-CM | POA: Diagnosis not present

## 2021-02-22 DIAGNOSIS — R2689 Other abnormalities of gait and mobility: Secondary | ICD-10-CM | POA: Diagnosis not present

## 2021-02-22 DIAGNOSIS — R2681 Unsteadiness on feet: Secondary | ICD-10-CM | POA: Diagnosis not present

## 2021-02-27 DIAGNOSIS — J301 Allergic rhinitis due to pollen: Secondary | ICD-10-CM | POA: Diagnosis not present

## 2021-02-27 DIAGNOSIS — E7800 Pure hypercholesterolemia, unspecified: Secondary | ICD-10-CM | POA: Diagnosis not present

## 2021-02-27 DIAGNOSIS — E039 Hypothyroidism, unspecified: Secondary | ICD-10-CM | POA: Diagnosis not present

## 2021-02-27 DIAGNOSIS — Z9641 Presence of insulin pump (external) (internal): Secondary | ICD-10-CM | POA: Diagnosis not present

## 2021-02-27 DIAGNOSIS — M8589 Other specified disorders of bone density and structure, multiple sites: Secondary | ICD-10-CM | POA: Diagnosis not present

## 2021-02-27 DIAGNOSIS — Z978 Presence of other specified devices: Secondary | ICD-10-CM | POA: Diagnosis not present

## 2021-02-27 DIAGNOSIS — I739 Peripheral vascular disease, unspecified: Secondary | ICD-10-CM | POA: Diagnosis not present

## 2021-02-27 DIAGNOSIS — N1831 Chronic kidney disease, stage 3a: Secondary | ICD-10-CM | POA: Diagnosis not present

## 2021-02-27 DIAGNOSIS — E103599 Type 1 diabetes mellitus with proliferative diabetic retinopathy without macular edema, unspecified eye: Secondary | ICD-10-CM | POA: Diagnosis not present

## 2021-02-27 DIAGNOSIS — I872 Venous insufficiency (chronic) (peripheral): Secondary | ICD-10-CM | POA: Diagnosis not present

## 2021-02-27 DIAGNOSIS — G4733 Obstructive sleep apnea (adult) (pediatric): Secondary | ICD-10-CM | POA: Diagnosis not present

## 2021-02-27 DIAGNOSIS — E109 Type 1 diabetes mellitus without complications: Secondary | ICD-10-CM | POA: Diagnosis not present

## 2021-02-27 DIAGNOSIS — I639 Cerebral infarction, unspecified: Secondary | ICD-10-CM | POA: Diagnosis not present

## 2021-03-03 DIAGNOSIS — I739 Peripheral vascular disease, unspecified: Secondary | ICD-10-CM | POA: Diagnosis not present

## 2021-03-03 DIAGNOSIS — Z794 Long term (current) use of insulin: Secondary | ICD-10-CM | POA: Diagnosis not present

## 2021-03-03 DIAGNOSIS — E1022 Type 1 diabetes mellitus with diabetic chronic kidney disease: Secondary | ICD-10-CM | POA: Diagnosis not present

## 2021-03-03 DIAGNOSIS — E7800 Pure hypercholesterolemia, unspecified: Secondary | ICD-10-CM | POA: Diagnosis not present

## 2021-03-03 DIAGNOSIS — I872 Venous insufficiency (chronic) (peripheral): Secondary | ICD-10-CM | POA: Diagnosis not present

## 2021-03-03 DIAGNOSIS — Z978 Presence of other specified devices: Secondary | ICD-10-CM | POA: Diagnosis not present

## 2021-03-03 DIAGNOSIS — Z9641 Presence of insulin pump (external) (internal): Secondary | ICD-10-CM | POA: Diagnosis not present

## 2021-03-03 DIAGNOSIS — M8589 Other specified disorders of bone density and structure, multiple sites: Secondary | ICD-10-CM | POA: Diagnosis not present

## 2021-03-03 DIAGNOSIS — N1831 Chronic kidney disease, stage 3a: Secondary | ICD-10-CM | POA: Diagnosis not present

## 2021-03-03 DIAGNOSIS — E039 Hypothyroidism, unspecified: Secondary | ICD-10-CM | POA: Diagnosis not present

## 2021-03-03 DIAGNOSIS — E103599 Type 1 diabetes mellitus with proliferative diabetic retinopathy without macular edema, unspecified eye: Secondary | ICD-10-CM | POA: Diagnosis not present

## 2021-03-03 DIAGNOSIS — I679 Cerebrovascular disease, unspecified: Secondary | ICD-10-CM | POA: Diagnosis not present

## 2021-03-06 DIAGNOSIS — Z8673 Personal history of transient ischemic attack (TIA), and cerebral infarction without residual deficits: Secondary | ICD-10-CM | POA: Diagnosis not present

## 2021-03-06 DIAGNOSIS — I639 Cerebral infarction, unspecified: Secondary | ICD-10-CM | POA: Diagnosis not present

## 2021-03-06 DIAGNOSIS — I472 Ventricular tachycardia: Secondary | ICD-10-CM | POA: Diagnosis not present

## 2021-03-23 DIAGNOSIS — R2689 Other abnormalities of gait and mobility: Secondary | ICD-10-CM | POA: Diagnosis not present

## 2021-03-23 DIAGNOSIS — R4789 Other speech disturbances: Secondary | ICD-10-CM | POA: Diagnosis not present

## 2021-03-23 DIAGNOSIS — R2681 Unsteadiness on feet: Secondary | ICD-10-CM | POA: Diagnosis not present

## 2021-03-23 DIAGNOSIS — R278 Other lack of coordination: Secondary | ICD-10-CM | POA: Diagnosis not present

## 2021-03-23 DIAGNOSIS — Z8673 Personal history of transient ischemic attack (TIA), and cerebral infarction without residual deficits: Secondary | ICD-10-CM | POA: Diagnosis not present

## 2021-03-23 DIAGNOSIS — M6281 Muscle weakness (generalized): Secondary | ICD-10-CM | POA: Diagnosis not present

## 2021-04-04 DIAGNOSIS — Z8673 Personal history of transient ischemic attack (TIA), and cerebral infarction without residual deficits: Secondary | ICD-10-CM | POA: Diagnosis not present

## 2021-04-04 DIAGNOSIS — F064 Anxiety disorder due to known physiological condition: Secondary | ICD-10-CM | POA: Diagnosis not present

## 2021-04-25 DIAGNOSIS — Z8673 Personal history of transient ischemic attack (TIA), and cerebral infarction without residual deficits: Secondary | ICD-10-CM | POA: Diagnosis not present

## 2021-04-25 DIAGNOSIS — M6281 Muscle weakness (generalized): Secondary | ICD-10-CM | POA: Diagnosis not present

## 2021-04-25 DIAGNOSIS — R2689 Other abnormalities of gait and mobility: Secondary | ICD-10-CM | POA: Diagnosis not present

## 2021-04-25 DIAGNOSIS — R278 Other lack of coordination: Secondary | ICD-10-CM | POA: Diagnosis not present

## 2021-04-25 DIAGNOSIS — Z79899 Other long term (current) drug therapy: Secondary | ICD-10-CM | POA: Diagnosis not present

## 2021-04-25 DIAGNOSIS — R2681 Unsteadiness on feet: Secondary | ICD-10-CM | POA: Diagnosis not present

## 2021-04-25 DIAGNOSIS — R4789 Other speech disturbances: Secondary | ICD-10-CM | POA: Diagnosis not present

## 2021-05-16 DIAGNOSIS — R269 Unspecified abnormalities of gait and mobility: Secondary | ICD-10-CM | POA: Diagnosis not present

## 2021-05-24 DIAGNOSIS — I739 Peripheral vascular disease, unspecified: Secondary | ICD-10-CM | POA: Diagnosis not present

## 2021-05-24 DIAGNOSIS — E109 Type 1 diabetes mellitus without complications: Secondary | ICD-10-CM | POA: Diagnosis not present

## 2021-05-24 DIAGNOSIS — F3341 Major depressive disorder, recurrent, in partial remission: Secondary | ICD-10-CM | POA: Diagnosis not present

## 2021-05-24 DIAGNOSIS — Z794 Long term (current) use of insulin: Secondary | ICD-10-CM | POA: Diagnosis not present

## 2021-05-24 DIAGNOSIS — L602 Onychogryphosis: Secondary | ICD-10-CM | POA: Diagnosis not present

## 2021-05-25 DIAGNOSIS — R278 Other lack of coordination: Secondary | ICD-10-CM | POA: Diagnosis not present

## 2021-05-25 DIAGNOSIS — M6281 Muscle weakness (generalized): Secondary | ICD-10-CM | POA: Diagnosis not present

## 2021-05-25 DIAGNOSIS — R2681 Unsteadiness on feet: Secondary | ICD-10-CM | POA: Diagnosis not present

## 2021-05-25 DIAGNOSIS — Z8673 Personal history of transient ischemic attack (TIA), and cerebral infarction without residual deficits: Secondary | ICD-10-CM | POA: Diagnosis not present

## 2021-05-29 DIAGNOSIS — G4733 Obstructive sleep apnea (adult) (pediatric): Secondary | ICD-10-CM | POA: Diagnosis not present

## 2021-05-29 DIAGNOSIS — E039 Hypothyroidism, unspecified: Secondary | ICD-10-CM | POA: Diagnosis not present

## 2021-05-29 DIAGNOSIS — E7800 Pure hypercholesterolemia, unspecified: Secondary | ICD-10-CM | POA: Diagnosis not present

## 2021-05-29 DIAGNOSIS — N1831 Chronic kidney disease, stage 3a: Secondary | ICD-10-CM | POA: Diagnosis not present

## 2021-05-29 DIAGNOSIS — E109 Type 1 diabetes mellitus without complications: Secondary | ICD-10-CM | POA: Diagnosis not present

## 2021-06-05 DIAGNOSIS — E890 Postprocedural hypothyroidism: Secondary | ICD-10-CM | POA: Diagnosis not present

## 2021-06-05 DIAGNOSIS — Z9641 Presence of insulin pump (external) (internal): Secondary | ICD-10-CM | POA: Diagnosis not present

## 2021-06-05 DIAGNOSIS — E109 Type 1 diabetes mellitus without complications: Secondary | ICD-10-CM | POA: Diagnosis not present

## 2021-06-09 DIAGNOSIS — R0600 Dyspnea, unspecified: Secondary | ICD-10-CM | POA: Diagnosis not present

## 2021-06-09 DIAGNOSIS — J301 Allergic rhinitis due to pollen: Secondary | ICD-10-CM | POA: Diagnosis not present

## 2021-06-09 DIAGNOSIS — G4733 Obstructive sleep apnea (adult) (pediatric): Secondary | ICD-10-CM | POA: Diagnosis not present

## 2021-06-15 DIAGNOSIS — G4733 Obstructive sleep apnea (adult) (pediatric): Secondary | ICD-10-CM | POA: Diagnosis not present

## 2021-06-22 DIAGNOSIS — R278 Other lack of coordination: Secondary | ICD-10-CM | POA: Diagnosis not present

## 2021-06-22 DIAGNOSIS — R2689 Other abnormalities of gait and mobility: Secondary | ICD-10-CM | POA: Diagnosis not present

## 2021-06-22 DIAGNOSIS — R2681 Unsteadiness on feet: Secondary | ICD-10-CM | POA: Diagnosis not present

## 2021-06-22 DIAGNOSIS — M6281 Muscle weakness (generalized): Secondary | ICD-10-CM | POA: Diagnosis not present

## 2021-06-22 DIAGNOSIS — R479 Unspecified speech disturbances: Secondary | ICD-10-CM | POA: Diagnosis not present

## 2021-06-22 DIAGNOSIS — Z8673 Personal history of transient ischemic attack (TIA), and cerebral infarction without residual deficits: Secondary | ICD-10-CM | POA: Diagnosis not present

## 2021-06-23 DIAGNOSIS — R0600 Dyspnea, unspecified: Secondary | ICD-10-CM | POA: Diagnosis not present

## 2021-06-23 DIAGNOSIS — G4733 Obstructive sleep apnea (adult) (pediatric): Secondary | ICD-10-CM | POA: Diagnosis not present

## 2021-06-23 DIAGNOSIS — J301 Allergic rhinitis due to pollen: Secondary | ICD-10-CM | POA: Diagnosis not present

## 2021-06-29 DIAGNOSIS — I6782 Cerebral ischemia: Secondary | ICD-10-CM | POA: Diagnosis not present

## 2021-06-29 DIAGNOSIS — I6521 Occlusion and stenosis of right carotid artery: Secondary | ICD-10-CM | POA: Diagnosis not present

## 2021-06-29 DIAGNOSIS — Z8673 Personal history of transient ischemic attack (TIA), and cerebral infarction without residual deficits: Secondary | ICD-10-CM | POA: Diagnosis not present

## 2021-07-11 ENCOUNTER — Other Ambulatory Visit: Payer: Self-pay | Admitting: Internal Medicine

## 2021-07-11 DIAGNOSIS — Z1231 Encounter for screening mammogram for malignant neoplasm of breast: Secondary | ICD-10-CM

## 2021-07-14 DIAGNOSIS — R768 Other specified abnormal immunological findings in serum: Secondary | ICD-10-CM | POA: Diagnosis not present

## 2021-07-14 DIAGNOSIS — G8929 Other chronic pain: Secondary | ICD-10-CM | POA: Diagnosis not present

## 2021-07-14 DIAGNOSIS — M25562 Pain in left knee: Secondary | ICD-10-CM | POA: Diagnosis not present

## 2021-07-14 DIAGNOSIS — M25561 Pain in right knee: Secondary | ICD-10-CM | POA: Diagnosis not present

## 2021-07-14 DIAGNOSIS — R0602 Shortness of breath: Secondary | ICD-10-CM | POA: Diagnosis not present

## 2021-07-27 DIAGNOSIS — N3942 Incontinence without sensory awareness: Secondary | ICD-10-CM | POA: Diagnosis not present

## 2021-07-27 DIAGNOSIS — N3946 Mixed incontinence: Secondary | ICD-10-CM | POA: Diagnosis not present

## 2021-07-28 DIAGNOSIS — I6521 Occlusion and stenosis of right carotid artery: Secondary | ICD-10-CM | POA: Diagnosis not present

## 2021-07-28 DIAGNOSIS — R0609 Other forms of dyspnea: Secondary | ICD-10-CM | POA: Diagnosis not present

## 2021-08-03 DIAGNOSIS — Z8659 Personal history of other mental and behavioral disorders: Secondary | ICD-10-CM | POA: Diagnosis not present

## 2021-08-03 DIAGNOSIS — F419 Anxiety disorder, unspecified: Secondary | ICD-10-CM | POA: Diagnosis not present

## 2021-08-16 ENCOUNTER — Ambulatory Visit
Admission: RE | Admit: 2021-08-16 | Discharge: 2021-08-16 | Disposition: A | Discharge status: Home / Self Care | Payer: Medicare Other | Source: Ambulatory Visit | Attending: Internal Medicine | Admitting: Internal Medicine

## 2021-08-16 ENCOUNTER — Other Ambulatory Visit: Payer: Self-pay

## 2021-08-16 DIAGNOSIS — Z1231 Encounter for screening mammogram for malignant neoplasm of breast: Secondary | ICD-10-CM

## 2021-08-17 DIAGNOSIS — Z23 Encounter for immunization: Secondary | ICD-10-CM | POA: Diagnosis not present

## 2021-08-22 DIAGNOSIS — G4733 Obstructive sleep apnea (adult) (pediatric): Secondary | ICD-10-CM | POA: Diagnosis not present

## 2021-08-22 DIAGNOSIS — R0600 Dyspnea, unspecified: Secondary | ICD-10-CM | POA: Diagnosis not present

## 2021-08-22 DIAGNOSIS — J301 Allergic rhinitis due to pollen: Secondary | ICD-10-CM | POA: Diagnosis not present

## 2021-08-29 DIAGNOSIS — J301 Allergic rhinitis due to pollen: Secondary | ICD-10-CM | POA: Diagnosis not present

## 2021-08-29 DIAGNOSIS — R0600 Dyspnea, unspecified: Secondary | ICD-10-CM | POA: Diagnosis not present

## 2021-08-29 DIAGNOSIS — G4733 Obstructive sleep apnea (adult) (pediatric): Secondary | ICD-10-CM | POA: Diagnosis not present

## 2021-08-30 DIAGNOSIS — Z23 Encounter for immunization: Secondary | ICD-10-CM | POA: Diagnosis not present

## 2021-08-31 DIAGNOSIS — Z20822 Contact with and (suspected) exposure to covid-19: Secondary | ICD-10-CM | POA: Diagnosis not present

## 2021-09-07 DIAGNOSIS — E109 Type 1 diabetes mellitus without complications: Secondary | ICD-10-CM | POA: Diagnosis not present

## 2021-09-11 DIAGNOSIS — Z9641 Presence of insulin pump (external) (internal): Secondary | ICD-10-CM | POA: Diagnosis not present

## 2021-09-11 DIAGNOSIS — E890 Postprocedural hypothyroidism: Secondary | ICD-10-CM | POA: Diagnosis not present

## 2021-09-11 DIAGNOSIS — E1022 Type 1 diabetes mellitus with diabetic chronic kidney disease: Secondary | ICD-10-CM | POA: Diagnosis not present

## 2021-09-11 DIAGNOSIS — E10319 Type 1 diabetes mellitus with unspecified diabetic retinopathy without macular edema: Secondary | ICD-10-CM | POA: Diagnosis not present

## 2021-09-11 DIAGNOSIS — N183 Chronic kidney disease, stage 3 unspecified: Secondary | ICD-10-CM | POA: Diagnosis not present

## 2021-09-25 ENCOUNTER — Encounter (INDEPENDENT_AMBULATORY_CARE_PROVIDER_SITE_OTHER): Payer: Medicare Other | Admitting: Ophthalmology

## 2021-09-25 ENCOUNTER — Other Ambulatory Visit: Payer: Self-pay

## 2021-09-27 DIAGNOSIS — F419 Anxiety disorder, unspecified: Secondary | ICD-10-CM | POA: Diagnosis not present

## 2021-09-27 DIAGNOSIS — Z8659 Personal history of other mental and behavioral disorders: Secondary | ICD-10-CM | POA: Diagnosis not present

## 2021-10-09 DIAGNOSIS — G4733 Obstructive sleep apnea (adult) (pediatric): Secondary | ICD-10-CM | POA: Diagnosis not present

## 2021-10-09 DIAGNOSIS — J301 Allergic rhinitis due to pollen: Secondary | ICD-10-CM | POA: Diagnosis not present

## 2021-10-09 DIAGNOSIS — R0600 Dyspnea, unspecified: Secondary | ICD-10-CM | POA: Diagnosis not present

## 2021-10-25 DIAGNOSIS — E1022 Type 1 diabetes mellitus with diabetic chronic kidney disease: Secondary | ICD-10-CM | POA: Diagnosis not present

## 2021-10-25 DIAGNOSIS — L602 Onychogryphosis: Secondary | ICD-10-CM | POA: Diagnosis not present

## 2021-10-25 DIAGNOSIS — N183 Chronic kidney disease, stage 3 unspecified: Secondary | ICD-10-CM | POA: Diagnosis not present

## 2021-10-25 DIAGNOSIS — I739 Peripheral vascular disease, unspecified: Secondary | ICD-10-CM | POA: Diagnosis not present

## 2021-11-03 ENCOUNTER — Encounter (INDEPENDENT_AMBULATORY_CARE_PROVIDER_SITE_OTHER): Payer: Medicare Other | Admitting: Ophthalmology

## 2021-11-14 ENCOUNTER — Other Ambulatory Visit: Payer: Self-pay

## 2021-11-14 ENCOUNTER — Encounter (INDEPENDENT_AMBULATORY_CARE_PROVIDER_SITE_OTHER): Payer: Medicare Other | Admitting: Ophthalmology

## 2021-11-14 DIAGNOSIS — H35033 Hypertensive retinopathy, bilateral: Secondary | ICD-10-CM | POA: Diagnosis not present

## 2021-11-14 DIAGNOSIS — H43813 Vitreous degeneration, bilateral: Secondary | ICD-10-CM

## 2021-11-14 DIAGNOSIS — H338 Other retinal detachments: Secondary | ICD-10-CM

## 2021-11-14 DIAGNOSIS — Z20822 Contact with and (suspected) exposure to covid-19: Secondary | ICD-10-CM | POA: Diagnosis not present

## 2021-11-14 DIAGNOSIS — E113531 Type 2 diabetes mellitus with proliferative diabetic retinopathy with traction retinal detachment not involving the macula, right eye: Secondary | ICD-10-CM

## 2021-11-14 DIAGNOSIS — I10 Essential (primary) hypertension: Secondary | ICD-10-CM

## 2021-11-14 DIAGNOSIS — E113522 Type 2 diabetes mellitus with proliferative diabetic retinopathy with traction retinal detachment involving the macula, left eye: Secondary | ICD-10-CM

## 2021-11-18 DIAGNOSIS — Z20822 Contact with and (suspected) exposure to covid-19: Secondary | ICD-10-CM | POA: Diagnosis not present

## 2021-12-04 DIAGNOSIS — E890 Postprocedural hypothyroidism: Secondary | ICD-10-CM | POA: Diagnosis not present

## 2021-12-06 DIAGNOSIS — I129 Hypertensive chronic kidney disease with stage 1 through stage 4 chronic kidney disease, or unspecified chronic kidney disease: Secondary | ICD-10-CM | POA: Diagnosis not present

## 2021-12-06 DIAGNOSIS — Z9641 Presence of insulin pump (external) (internal): Secondary | ICD-10-CM | POA: Diagnosis not present

## 2021-12-06 DIAGNOSIS — Z79890 Hormone replacement therapy (postmenopausal): Secondary | ICD-10-CM | POA: Diagnosis not present

## 2021-12-06 DIAGNOSIS — E890 Postprocedural hypothyroidism: Secondary | ICD-10-CM | POA: Diagnosis not present

## 2021-12-06 DIAGNOSIS — E10319 Type 1 diabetes mellitus with unspecified diabetic retinopathy without macular edema: Secondary | ICD-10-CM | POA: Diagnosis not present

## 2021-12-06 DIAGNOSIS — Z87891 Personal history of nicotine dependence: Secondary | ICD-10-CM | POA: Diagnosis not present

## 2021-12-06 DIAGNOSIS — N183 Chronic kidney disease, stage 3 unspecified: Secondary | ICD-10-CM | POA: Diagnosis not present

## 2021-12-06 DIAGNOSIS — Z4681 Encounter for fitting and adjustment of insulin pump: Secondary | ICD-10-CM | POA: Diagnosis not present

## 2021-12-06 DIAGNOSIS — E1022 Type 1 diabetes mellitus with diabetic chronic kidney disease: Secondary | ICD-10-CM | POA: Diagnosis not present

## 2021-12-12 DIAGNOSIS — F4320 Adjustment disorder, unspecified: Secondary | ICD-10-CM | POA: Diagnosis not present

## 2021-12-13 DIAGNOSIS — Z8659 Personal history of other mental and behavioral disorders: Secondary | ICD-10-CM | POA: Diagnosis not present

## 2021-12-13 DIAGNOSIS — F419 Anxiety disorder, unspecified: Secondary | ICD-10-CM | POA: Diagnosis not present

## 2021-12-20 DIAGNOSIS — Z20822 Contact with and (suspected) exposure to covid-19: Secondary | ICD-10-CM | POA: Diagnosis not present

## 2021-12-23 DIAGNOSIS — Z20822 Contact with and (suspected) exposure to covid-19: Secondary | ICD-10-CM | POA: Diagnosis not present

## 2022-01-17 DIAGNOSIS — Z20822 Contact with and (suspected) exposure to covid-19: Secondary | ICD-10-CM | POA: Diagnosis not present

## 2022-01-19 DIAGNOSIS — Z936 Other artificial openings of urinary tract status: Secondary | ICD-10-CM | POA: Diagnosis not present

## 2022-01-20 DIAGNOSIS — Z20822 Contact with and (suspected) exposure to covid-19: Secondary | ICD-10-CM | POA: Diagnosis not present

## 2022-01-22 DIAGNOSIS — Z20822 Contact with and (suspected) exposure to covid-19: Secondary | ICD-10-CM | POA: Diagnosis not present

## 2022-01-25 DIAGNOSIS — E109 Type 1 diabetes mellitus without complications: Secondary | ICD-10-CM | POA: Diagnosis not present

## 2022-01-25 DIAGNOSIS — Z9641 Presence of insulin pump (external) (internal): Secondary | ICD-10-CM | POA: Diagnosis not present

## 2022-01-26 DIAGNOSIS — R300 Dysuria: Secondary | ICD-10-CM | POA: Diagnosis not present

## 2022-01-26 DIAGNOSIS — N3090 Cystitis, unspecified without hematuria: Secondary | ICD-10-CM | POA: Diagnosis not present

## 2022-01-30 DIAGNOSIS — Z20822 Contact with and (suspected) exposure to covid-19: Secondary | ICD-10-CM | POA: Diagnosis not present

## 2022-02-09 DIAGNOSIS — R260 Ataxic gait: Secondary | ICD-10-CM | POA: Diagnosis not present

## 2022-02-10 DIAGNOSIS — Z20828 Contact with and (suspected) exposure to other viral communicable diseases: Secondary | ICD-10-CM | POA: Diagnosis not present

## 2022-02-13 DIAGNOSIS — R260 Ataxic gait: Secondary | ICD-10-CM | POA: Diagnosis not present

## 2022-02-15 DIAGNOSIS — Z20822 Contact with and (suspected) exposure to covid-19: Secondary | ICD-10-CM | POA: Diagnosis not present

## 2022-02-15 DIAGNOSIS — R260 Ataxic gait: Secondary | ICD-10-CM | POA: Diagnosis not present

## 2022-02-20 DIAGNOSIS — R260 Ataxic gait: Secondary | ICD-10-CM | POA: Diagnosis not present

## 2022-02-21 DIAGNOSIS — Z6824 Body mass index (BMI) 24.0-24.9, adult: Secondary | ICD-10-CM | POA: Diagnosis not present

## 2022-02-21 DIAGNOSIS — Z01419 Encounter for gynecological examination (general) (routine) without abnormal findings: Secondary | ICD-10-CM | POA: Diagnosis not present

## 2022-02-23 DIAGNOSIS — R260 Ataxic gait: Secondary | ICD-10-CM | POA: Diagnosis not present

## 2022-02-26 DIAGNOSIS — E109 Type 1 diabetes mellitus without complications: Secondary | ICD-10-CM | POA: Diagnosis not present

## 2022-02-27 DIAGNOSIS — I6529 Occlusion and stenosis of unspecified carotid artery: Secondary | ICD-10-CM | POA: Diagnosis not present

## 2022-02-27 DIAGNOSIS — Z8673 Personal history of transient ischemic attack (TIA), and cerebral infarction without residual deficits: Secondary | ICD-10-CM | POA: Diagnosis not present

## 2022-02-27 DIAGNOSIS — Z20822 Contact with and (suspected) exposure to covid-19: Secondary | ICD-10-CM | POA: Diagnosis not present

## 2022-02-27 DIAGNOSIS — Z79899 Other long term (current) drug therapy: Secondary | ICD-10-CM | POA: Diagnosis not present

## 2022-02-27 DIAGNOSIS — I6522 Occlusion and stenosis of left carotid artery: Secondary | ICD-10-CM | POA: Diagnosis not present

## 2022-02-27 DIAGNOSIS — N183 Chronic kidney disease, stage 3 unspecified: Secondary | ICD-10-CM | POA: Diagnosis not present

## 2022-02-27 DIAGNOSIS — Z87891 Personal history of nicotine dependence: Secondary | ICD-10-CM | POA: Diagnosis not present

## 2022-02-27 DIAGNOSIS — E1122 Type 2 diabetes mellitus with diabetic chronic kidney disease: Secondary | ICD-10-CM | POA: Diagnosis not present

## 2022-02-27 DIAGNOSIS — I6521 Occlusion and stenosis of right carotid artery: Secondary | ICD-10-CM | POA: Diagnosis not present

## 2022-02-27 DIAGNOSIS — Z7982 Long term (current) use of aspirin: Secondary | ICD-10-CM | POA: Diagnosis not present

## 2022-02-27 DIAGNOSIS — Z853 Personal history of malignant neoplasm of breast: Secondary | ICD-10-CM | POA: Diagnosis not present

## 2022-03-05 DIAGNOSIS — R260 Ataxic gait: Secondary | ICD-10-CM | POA: Diagnosis not present

## 2022-03-07 DIAGNOSIS — R260 Ataxic gait: Secondary | ICD-10-CM | POA: Diagnosis not present

## 2022-03-08 DIAGNOSIS — H31019 Macula scars of posterior pole (postinflammatory) (post-traumatic), unspecified eye: Secondary | ICD-10-CM | POA: Diagnosis not present

## 2022-03-08 DIAGNOSIS — H524 Presbyopia: Secondary | ICD-10-CM | POA: Diagnosis not present

## 2022-03-08 DIAGNOSIS — H47213 Primary optic atrophy, bilateral: Secondary | ICD-10-CM | POA: Diagnosis not present

## 2022-03-13 DIAGNOSIS — Z9641 Presence of insulin pump (external) (internal): Secondary | ICD-10-CM | POA: Diagnosis not present

## 2022-03-13 DIAGNOSIS — Z79890 Hormone replacement therapy (postmenopausal): Secondary | ICD-10-CM | POA: Diagnosis not present

## 2022-03-13 DIAGNOSIS — E1022 Type 1 diabetes mellitus with diabetic chronic kidney disease: Secondary | ICD-10-CM | POA: Diagnosis not present

## 2022-03-13 DIAGNOSIS — Z8673 Personal history of transient ischemic attack (TIA), and cerebral infarction without residual deficits: Secondary | ICD-10-CM | POA: Diagnosis not present

## 2022-03-13 DIAGNOSIS — N183 Chronic kidney disease, stage 3 unspecified: Secondary | ICD-10-CM | POA: Diagnosis not present

## 2022-03-13 DIAGNOSIS — E1059 Type 1 diabetes mellitus with other circulatory complications: Secondary | ICD-10-CM | POA: Diagnosis not present

## 2022-03-13 DIAGNOSIS — E10649 Type 1 diabetes mellitus with hypoglycemia without coma: Secondary | ICD-10-CM | POA: Diagnosis not present

## 2022-03-13 DIAGNOSIS — Z4681 Encounter for fitting and adjustment of insulin pump: Secondary | ICD-10-CM | POA: Diagnosis not present

## 2022-03-13 DIAGNOSIS — E890 Postprocedural hypothyroidism: Secondary | ICD-10-CM | POA: Diagnosis not present

## 2022-03-21 DIAGNOSIS — N3946 Mixed incontinence: Secondary | ICD-10-CM | POA: Diagnosis not present

## 2022-03-26 DIAGNOSIS — R260 Ataxic gait: Secondary | ICD-10-CM | POA: Diagnosis not present

## 2022-03-29 DIAGNOSIS — R260 Ataxic gait: Secondary | ICD-10-CM | POA: Diagnosis not present

## 2022-04-03 DIAGNOSIS — R260 Ataxic gait: Secondary | ICD-10-CM | POA: Diagnosis not present

## 2022-04-04 DIAGNOSIS — Z8659 Personal history of other mental and behavioral disorders: Secondary | ICD-10-CM | POA: Diagnosis not present

## 2022-04-04 DIAGNOSIS — F419 Anxiety disorder, unspecified: Secondary | ICD-10-CM | POA: Diagnosis not present

## 2022-04-05 DIAGNOSIS — R260 Ataxic gait: Secondary | ICD-10-CM | POA: Diagnosis not present

## 2022-04-09 DIAGNOSIS — R260 Ataxic gait: Secondary | ICD-10-CM | POA: Diagnosis not present

## 2022-04-19 DIAGNOSIS — R260 Ataxic gait: Secondary | ICD-10-CM | POA: Diagnosis not present

## 2022-04-27 DIAGNOSIS — R260 Ataxic gait: Secondary | ICD-10-CM | POA: Diagnosis not present

## 2022-05-02 DIAGNOSIS — R260 Ataxic gait: Secondary | ICD-10-CM | POA: Diagnosis not present

## 2022-05-11 DIAGNOSIS — E1022 Type 1 diabetes mellitus with diabetic chronic kidney disease: Secondary | ICD-10-CM | POA: Diagnosis not present

## 2022-05-11 DIAGNOSIS — Z794 Long term (current) use of insulin: Secondary | ICD-10-CM | POA: Diagnosis not present

## 2022-05-11 DIAGNOSIS — L602 Onychogryphosis: Secondary | ICD-10-CM | POA: Diagnosis not present

## 2022-05-11 DIAGNOSIS — N183 Chronic kidney disease, stage 3 unspecified: Secondary | ICD-10-CM | POA: Diagnosis not present

## 2022-05-11 DIAGNOSIS — I739 Peripheral vascular disease, unspecified: Secondary | ICD-10-CM | POA: Diagnosis not present

## 2022-06-04 DIAGNOSIS — F411 Generalized anxiety disorder: Secondary | ICD-10-CM | POA: Diagnosis not present

## 2022-06-04 DIAGNOSIS — F4320 Adjustment disorder, unspecified: Secondary | ICD-10-CM | POA: Diagnosis not present

## 2022-06-12 DIAGNOSIS — R471 Dysarthria and anarthria: Secondary | ICD-10-CM | POA: Diagnosis not present

## 2022-06-12 DIAGNOSIS — R4181 Age-related cognitive decline: Secondary | ICD-10-CM | POA: Diagnosis not present

## 2022-06-12 DIAGNOSIS — R2689 Other abnormalities of gait and mobility: Secondary | ICD-10-CM | POA: Diagnosis not present

## 2022-06-12 DIAGNOSIS — R32 Unspecified urinary incontinence: Secondary | ICD-10-CM | POA: Diagnosis not present

## 2022-06-13 DIAGNOSIS — R4181 Age-related cognitive decline: Secondary | ICD-10-CM | POA: Diagnosis not present

## 2022-06-13 DIAGNOSIS — R32 Unspecified urinary incontinence: Secondary | ICD-10-CM | POA: Diagnosis not present

## 2022-06-13 DIAGNOSIS — E039 Hypothyroidism, unspecified: Secondary | ICD-10-CM | POA: Diagnosis not present

## 2022-06-13 DIAGNOSIS — R2689 Other abnormalities of gait and mobility: Secondary | ICD-10-CM | POA: Diagnosis not present

## 2022-06-14 DIAGNOSIS — R32 Unspecified urinary incontinence: Secondary | ICD-10-CM | POA: Diagnosis not present

## 2022-06-14 DIAGNOSIS — R2689 Other abnormalities of gait and mobility: Secondary | ICD-10-CM | POA: Diagnosis not present

## 2022-06-14 DIAGNOSIS — R4181 Age-related cognitive decline: Secondary | ICD-10-CM | POA: Diagnosis not present

## 2022-06-14 DIAGNOSIS — F04 Amnestic disorder due to known physiological condition: Secondary | ICD-10-CM | POA: Diagnosis not present

## 2022-06-27 DIAGNOSIS — E109 Type 1 diabetes mellitus without complications: Secondary | ICD-10-CM | POA: Diagnosis not present

## 2022-06-27 DIAGNOSIS — F32A Depression, unspecified: Secondary | ICD-10-CM | POA: Diagnosis not present

## 2022-06-27 DIAGNOSIS — N183 Chronic kidney disease, stage 3 unspecified: Secondary | ICD-10-CM | POA: Diagnosis not present

## 2022-06-27 DIAGNOSIS — Z9889 Other specified postprocedural states: Secondary | ICD-10-CM | POA: Diagnosis not present

## 2022-06-27 DIAGNOSIS — M2550 Pain in unspecified joint: Secondary | ICD-10-CM | POA: Diagnosis not present

## 2022-06-27 DIAGNOSIS — M6281 Muscle weakness (generalized): Secondary | ICD-10-CM | POA: Diagnosis not present

## 2022-06-27 DIAGNOSIS — R079 Chest pain, unspecified: Secondary | ICD-10-CM | POA: Diagnosis not present

## 2022-06-27 DIAGNOSIS — J449 Chronic obstructive pulmonary disease, unspecified: Secondary | ICD-10-CM | POA: Diagnosis not present

## 2022-06-27 DIAGNOSIS — R29701 NIHSS score 1: Secondary | ICD-10-CM | POA: Diagnosis not present

## 2022-06-27 DIAGNOSIS — S72052A Unspecified fracture of head of left femur, initial encounter for closed fracture: Secondary | ICD-10-CM | POA: Diagnosis not present

## 2022-06-27 DIAGNOSIS — W19XXXA Unspecified fall, initial encounter: Secondary | ICD-10-CM | POA: Diagnosis not present

## 2022-06-27 DIAGNOSIS — D649 Anemia, unspecified: Secondary | ICD-10-CM | POA: Diagnosis not present

## 2022-06-27 DIAGNOSIS — W19XXXD Unspecified fall, subsequent encounter: Secondary | ICD-10-CM | POA: Diagnosis not present

## 2022-06-27 DIAGNOSIS — M25552 Pain in left hip: Secondary | ICD-10-CM | POA: Diagnosis not present

## 2022-06-27 DIAGNOSIS — Z794 Long term (current) use of insulin: Secondary | ICD-10-CM | POA: Diagnosis not present

## 2022-06-27 DIAGNOSIS — M1990 Unspecified osteoarthritis, unspecified site: Secondary | ICD-10-CM | POA: Diagnosis not present

## 2022-06-27 DIAGNOSIS — R29818 Other symptoms and signs involving the nervous system: Secondary | ICD-10-CM | POA: Diagnosis not present

## 2022-06-27 DIAGNOSIS — R531 Weakness: Secondary | ICD-10-CM | POA: Diagnosis not present

## 2022-06-27 DIAGNOSIS — S72012A Unspecified intracapsular fracture of left femur, initial encounter for closed fracture: Secondary | ICD-10-CM | POA: Diagnosis not present

## 2022-06-27 DIAGNOSIS — I6523 Occlusion and stenosis of bilateral carotid arteries: Secondary | ICD-10-CM | POA: Diagnosis not present

## 2022-06-27 DIAGNOSIS — S72002D Fracture of unspecified part of neck of left femur, subsequent encounter for closed fracture with routine healing: Secondary | ICD-10-CM | POA: Diagnosis not present

## 2022-06-27 DIAGNOSIS — I491 Atrial premature depolarization: Secondary | ICD-10-CM | POA: Diagnosis not present

## 2022-06-27 DIAGNOSIS — E119 Type 2 diabetes mellitus without complications: Secondary | ICD-10-CM | POA: Diagnosis not present

## 2022-06-27 DIAGNOSIS — I959 Hypotension, unspecified: Secondary | ICD-10-CM | POA: Diagnosis not present

## 2022-06-27 DIAGNOSIS — Z882 Allergy status to sulfonamides status: Secondary | ICD-10-CM | POA: Diagnosis not present

## 2022-06-27 DIAGNOSIS — N319 Neuromuscular dysfunction of bladder, unspecified: Secondary | ICD-10-CM | POA: Diagnosis not present

## 2022-06-27 DIAGNOSIS — Z01818 Encounter for other preprocedural examination: Secondary | ICD-10-CM | POA: Diagnosis not present

## 2022-06-27 DIAGNOSIS — F419 Anxiety disorder, unspecified: Secondary | ICD-10-CM | POA: Diagnosis not present

## 2022-06-27 DIAGNOSIS — M810 Age-related osteoporosis without current pathological fracture: Secondary | ICD-10-CM | POA: Diagnosis not present

## 2022-06-27 DIAGNOSIS — Z7401 Bed confinement status: Secondary | ICD-10-CM | POA: Diagnosis not present

## 2022-06-27 DIAGNOSIS — I672 Cerebral atherosclerosis: Secondary | ICD-10-CM | POA: Diagnosis not present

## 2022-06-27 DIAGNOSIS — E1022 Type 1 diabetes mellitus with diabetic chronic kidney disease: Secondary | ICD-10-CM | POA: Diagnosis not present

## 2022-06-27 DIAGNOSIS — R2689 Other abnormalities of gait and mobility: Secondary | ICD-10-CM | POA: Diagnosis not present

## 2022-06-27 DIAGNOSIS — E785 Hyperlipidemia, unspecified: Secondary | ICD-10-CM | POA: Diagnosis not present

## 2022-06-27 DIAGNOSIS — I129 Hypertensive chronic kidney disease with stage 1 through stage 4 chronic kidney disease, or unspecified chronic kidney disease: Secondary | ICD-10-CM | POA: Diagnosis not present

## 2022-06-27 DIAGNOSIS — Z853 Personal history of malignant neoplasm of breast: Secondary | ICD-10-CM | POA: Diagnosis not present

## 2022-06-27 DIAGNOSIS — I6389 Other cerebral infarction: Secondary | ICD-10-CM | POA: Diagnosis not present

## 2022-06-27 DIAGNOSIS — R279 Unspecified lack of coordination: Secondary | ICD-10-CM | POA: Diagnosis not present

## 2022-06-27 DIAGNOSIS — Z8673 Personal history of transient ischemic attack (TIA), and cerebral infarction without residual deficits: Secondary | ICD-10-CM | POA: Diagnosis not present

## 2022-06-27 DIAGNOSIS — E7800 Pure hypercholesterolemia, unspecified: Secondary | ICD-10-CM | POA: Diagnosis not present

## 2022-06-27 DIAGNOSIS — Z79899 Other long term (current) drug therapy: Secondary | ICD-10-CM | POA: Diagnosis not present

## 2022-06-27 DIAGNOSIS — R600 Localized edema: Secondary | ICD-10-CM | POA: Diagnosis not present

## 2022-06-27 DIAGNOSIS — E039 Hypothyroidism, unspecified: Secondary | ICD-10-CM | POA: Diagnosis not present

## 2022-06-27 DIAGNOSIS — I639 Cerebral infarction, unspecified: Secondary | ICD-10-CM | POA: Diagnosis not present

## 2022-06-27 DIAGNOSIS — I10 Essential (primary) hypertension: Secondary | ICD-10-CM | POA: Diagnosis not present

## 2022-06-27 DIAGNOSIS — S72002A Fracture of unspecified part of neck of left femur, initial encounter for closed fracture: Secondary | ICD-10-CM | POA: Diagnosis not present

## 2022-06-27 DIAGNOSIS — Z96642 Presence of left artificial hip joint: Secondary | ICD-10-CM | POA: Diagnosis not present

## 2022-06-27 DIAGNOSIS — Z9641 Presence of insulin pump (external) (internal): Secondary | ICD-10-CM | POA: Diagnosis not present

## 2022-06-27 DIAGNOSIS — Z7982 Long term (current) use of aspirin: Secondary | ICD-10-CM | POA: Diagnosis not present

## 2022-06-27 DIAGNOSIS — H539 Unspecified visual disturbance: Secondary | ICD-10-CM | POA: Diagnosis not present

## 2022-06-27 DIAGNOSIS — K219 Gastro-esophageal reflux disease without esophagitis: Secondary | ICD-10-CM | POA: Diagnosis not present

## 2022-06-27 DIAGNOSIS — Z87440 Personal history of urinary (tract) infections: Secondary | ICD-10-CM | POA: Diagnosis not present

## 2022-06-30 DIAGNOSIS — I6389 Other cerebral infarction: Secondary | ICD-10-CM

## 2022-07-03 DIAGNOSIS — R279 Unspecified lack of coordination: Secondary | ICD-10-CM | POA: Diagnosis not present

## 2022-07-03 DIAGNOSIS — E039 Hypothyroidism, unspecified: Secondary | ICD-10-CM | POA: Diagnosis not present

## 2022-07-03 DIAGNOSIS — S72002D Fracture of unspecified part of neck of left femur, subsequent encounter for closed fracture with routine healing: Secondary | ICD-10-CM | POA: Diagnosis not present

## 2022-07-03 DIAGNOSIS — M2550 Pain in unspecified joint: Secondary | ICD-10-CM | POA: Diagnosis not present

## 2022-07-03 DIAGNOSIS — Z853 Personal history of malignant neoplasm of breast: Secondary | ICD-10-CM | POA: Diagnosis not present

## 2022-07-03 DIAGNOSIS — E7800 Pure hypercholesterolemia, unspecified: Secondary | ICD-10-CM | POA: Diagnosis not present

## 2022-07-03 DIAGNOSIS — I639 Cerebral infarction, unspecified: Secondary | ICD-10-CM | POA: Diagnosis not present

## 2022-07-03 DIAGNOSIS — Z79899 Other long term (current) drug therapy: Secondary | ICD-10-CM | POA: Diagnosis not present

## 2022-07-03 DIAGNOSIS — Z7982 Long term (current) use of aspirin: Secondary | ICD-10-CM | POA: Diagnosis not present

## 2022-07-03 DIAGNOSIS — Z794 Long term (current) use of insulin: Secondary | ICD-10-CM | POA: Diagnosis not present

## 2022-07-03 DIAGNOSIS — F419 Anxiety disorder, unspecified: Secondary | ICD-10-CM | POA: Diagnosis not present

## 2022-07-03 DIAGNOSIS — E109 Type 1 diabetes mellitus without complications: Secondary | ICD-10-CM | POA: Diagnosis not present

## 2022-07-03 DIAGNOSIS — Z9641 Presence of insulin pump (external) (internal): Secondary | ICD-10-CM | POA: Diagnosis not present

## 2022-07-03 DIAGNOSIS — F32A Depression, unspecified: Secondary | ICD-10-CM | POA: Diagnosis not present

## 2022-07-03 DIAGNOSIS — E1151 Type 2 diabetes mellitus with diabetic peripheral angiopathy without gangrene: Secondary | ICD-10-CM | POA: Diagnosis not present

## 2022-07-03 DIAGNOSIS — E785 Hyperlipidemia, unspecified: Secondary | ICD-10-CM | POA: Diagnosis not present

## 2022-07-03 DIAGNOSIS — J449 Chronic obstructive pulmonary disease, unspecified: Secondary | ICD-10-CM | POA: Diagnosis not present

## 2022-07-03 DIAGNOSIS — F413 Other mixed anxiety disorders: Secondary | ICD-10-CM | POA: Diagnosis not present

## 2022-07-03 DIAGNOSIS — R410 Disorientation, unspecified: Secondary | ICD-10-CM | POA: Diagnosis not present

## 2022-07-03 DIAGNOSIS — Z882 Allergy status to sulfonamides status: Secondary | ICD-10-CM | POA: Diagnosis not present

## 2022-07-03 DIAGNOSIS — I10 Essential (primary) hypertension: Secondary | ICD-10-CM | POA: Diagnosis not present

## 2022-07-03 DIAGNOSIS — R531 Weakness: Secondary | ICD-10-CM | POA: Diagnosis not present

## 2022-07-03 DIAGNOSIS — Z7401 Bed confinement status: Secondary | ICD-10-CM | POA: Diagnosis not present

## 2022-07-03 DIAGNOSIS — L89323 Pressure ulcer of left buttock, stage 3: Secondary | ICD-10-CM | POA: Diagnosis not present

## 2022-07-03 DIAGNOSIS — R29818 Other symptoms and signs involving the nervous system: Secondary | ICD-10-CM | POA: Diagnosis not present

## 2022-07-03 DIAGNOSIS — E1059 Type 1 diabetes mellitus with other circulatory complications: Secondary | ICD-10-CM | POA: Diagnosis not present

## 2022-07-03 DIAGNOSIS — E10319 Type 1 diabetes mellitus with unspecified diabetic retinopathy without macular edema: Secondary | ICD-10-CM | POA: Diagnosis not present

## 2022-07-03 DIAGNOSIS — R2689 Other abnormalities of gait and mobility: Secondary | ICD-10-CM | POA: Diagnosis not present

## 2022-07-03 DIAGNOSIS — R338 Other retention of urine: Secondary | ICD-10-CM | POA: Diagnosis not present

## 2022-07-03 DIAGNOSIS — F5105 Insomnia due to other mental disorder: Secondary | ICD-10-CM | POA: Diagnosis not present

## 2022-07-03 DIAGNOSIS — N319 Neuromuscular dysfunction of bladder, unspecified: Secondary | ICD-10-CM | POA: Diagnosis not present

## 2022-07-03 DIAGNOSIS — W19XXXD Unspecified fall, subsequent encounter: Secondary | ICD-10-CM | POA: Diagnosis not present

## 2022-07-03 DIAGNOSIS — N1831 Chronic kidney disease, stage 3a: Secondary | ICD-10-CM | POA: Diagnosis not present

## 2022-07-03 DIAGNOSIS — Z87440 Personal history of urinary (tract) infections: Secondary | ICD-10-CM | POA: Diagnosis not present

## 2022-07-03 DIAGNOSIS — I4891 Unspecified atrial fibrillation: Secondary | ICD-10-CM | POA: Diagnosis not present

## 2022-07-03 DIAGNOSIS — F4320 Adjustment disorder, unspecified: Secondary | ICD-10-CM | POA: Diagnosis not present

## 2022-07-03 DIAGNOSIS — K219 Gastro-esophageal reflux disease without esophagitis: Secondary | ICD-10-CM | POA: Diagnosis not present

## 2022-07-03 DIAGNOSIS — L89622 Pressure ulcer of left heel, stage 2: Secondary | ICD-10-CM | POA: Diagnosis not present

## 2022-07-03 DIAGNOSIS — S72009D Fracture of unspecified part of neck of unspecified femur, subsequent encounter for closed fracture with routine healing: Secondary | ICD-10-CM | POA: Diagnosis not present

## 2022-07-03 DIAGNOSIS — I119 Hypertensive heart disease without heart failure: Secondary | ICD-10-CM | POA: Diagnosis not present

## 2022-07-03 DIAGNOSIS — S72002A Fracture of unspecified part of neck of left femur, initial encounter for closed fracture: Secondary | ICD-10-CM | POA: Diagnosis not present

## 2022-07-03 DIAGNOSIS — I129 Hypertensive chronic kidney disease with stage 1 through stage 4 chronic kidney disease, or unspecified chronic kidney disease: Secondary | ICD-10-CM | POA: Diagnosis not present

## 2022-07-03 DIAGNOSIS — R262 Difficulty in walking, not elsewhere classified: Secondary | ICD-10-CM | POA: Diagnosis not present

## 2022-07-03 DIAGNOSIS — R29702 NIHSS score 2: Secondary | ICD-10-CM | POA: Diagnosis not present

## 2022-07-03 DIAGNOSIS — Z96642 Presence of left artificial hip joint: Secondary | ICD-10-CM | POA: Diagnosis not present

## 2022-07-03 DIAGNOSIS — F339 Major depressive disorder, recurrent, unspecified: Secondary | ICD-10-CM | POA: Diagnosis not present

## 2022-07-03 DIAGNOSIS — R4182 Altered mental status, unspecified: Secondary | ICD-10-CM | POA: Diagnosis not present

## 2022-07-03 DIAGNOSIS — M6281 Muscle weakness (generalized): Secondary | ICD-10-CM | POA: Diagnosis not present

## 2022-07-03 DIAGNOSIS — N183 Chronic kidney disease, stage 3 unspecified: Secondary | ICD-10-CM | POA: Diagnosis not present

## 2022-07-03 DIAGNOSIS — I6521 Occlusion and stenosis of right carotid artery: Secondary | ICD-10-CM | POA: Diagnosis not present

## 2022-07-03 DIAGNOSIS — Z8673 Personal history of transient ischemic attack (TIA), and cerebral infarction without residual deficits: Secondary | ICD-10-CM | POA: Diagnosis not present

## 2022-07-03 DIAGNOSIS — E876 Hypokalemia: Secondary | ICD-10-CM | POA: Diagnosis not present

## 2022-07-03 DIAGNOSIS — E1022 Type 1 diabetes mellitus with diabetic chronic kidney disease: Secondary | ICD-10-CM | POA: Diagnosis not present

## 2022-07-03 DIAGNOSIS — I69354 Hemiplegia and hemiparesis following cerebral infarction affecting left non-dominant side: Secondary | ICD-10-CM | POA: Diagnosis not present

## 2022-07-03 DIAGNOSIS — E1029 Type 1 diabetes mellitus with other diabetic kidney complication: Secondary | ICD-10-CM | POA: Diagnosis not present

## 2022-07-03 DIAGNOSIS — E1065 Type 1 diabetes mellitus with hyperglycemia: Secondary | ICD-10-CM | POA: Diagnosis not present

## 2022-07-03 DIAGNOSIS — M1990 Unspecified osteoarthritis, unspecified site: Secondary | ICD-10-CM | POA: Diagnosis not present

## 2022-07-03 DIAGNOSIS — R55 Syncope and collapse: Secondary | ICD-10-CM | POA: Diagnosis not present

## 2022-07-03 DIAGNOSIS — R5381 Other malaise: Secondary | ICD-10-CM | POA: Diagnosis not present

## 2022-07-03 DIAGNOSIS — M810 Age-related osteoporosis without current pathological fracture: Secondary | ICD-10-CM | POA: Diagnosis not present

## 2022-07-03 DIAGNOSIS — I739 Peripheral vascular disease, unspecified: Secondary | ICD-10-CM | POA: Diagnosis not present

## 2022-07-03 DIAGNOSIS — L89322 Pressure ulcer of left buttock, stage 2: Secondary | ICD-10-CM | POA: Diagnosis not present

## 2022-07-03 DIAGNOSIS — D649 Anemia, unspecified: Secondary | ICD-10-CM | POA: Diagnosis not present

## 2022-07-03 DIAGNOSIS — N390 Urinary tract infection, site not specified: Secondary | ICD-10-CM | POA: Diagnosis not present

## 2022-07-03 DIAGNOSIS — S72012A Unspecified intracapsular fracture of left femur, initial encounter for closed fracture: Secondary | ICD-10-CM | POA: Diagnosis not present

## 2022-07-04 DIAGNOSIS — E1151 Type 2 diabetes mellitus with diabetic peripheral angiopathy without gangrene: Secondary | ICD-10-CM | POA: Diagnosis not present

## 2022-07-04 DIAGNOSIS — R262 Difficulty in walking, not elsewhere classified: Secondary | ICD-10-CM | POA: Diagnosis not present

## 2022-07-04 DIAGNOSIS — S72009D Fracture of unspecified part of neck of unspecified femur, subsequent encounter for closed fracture with routine healing: Secondary | ICD-10-CM | POA: Diagnosis not present

## 2022-07-04 DIAGNOSIS — I639 Cerebral infarction, unspecified: Secondary | ICD-10-CM | POA: Diagnosis not present

## 2022-07-11 ENCOUNTER — Other Ambulatory Visit: Payer: Self-pay | Admitting: *Deleted

## 2022-07-11 NOTE — Patient Outreach (Signed)
Mrs. Sanagustin resides in [place] [facility]. Screening for potential [facility] care coordination/care management services as benefit of insurance plan and PCP.  Spoke with Hessie Diener [place] [facility] SW who reports Mrs. Mawhinney is from home with spouse. Currently utilizing hoyer lift. Goal is to return home pending progress.   Will continue to follow while Mrs. Petrilla while she resides in [place], [place].  Marthenia Rolling, MSN, RN,BSN [place] Acute Care Coordinator [phone] (Direct dial)

## 2022-07-12 DIAGNOSIS — L89322 Pressure ulcer of left buttock, stage 2: Secondary | ICD-10-CM | POA: Diagnosis not present

## 2022-07-12 DIAGNOSIS — L89622 Pressure ulcer of left heel, stage 2: Secondary | ICD-10-CM | POA: Diagnosis not present

## 2022-07-16 DIAGNOSIS — E1022 Type 1 diabetes mellitus with diabetic chronic kidney disease: Secondary | ICD-10-CM | POA: Diagnosis not present

## 2022-07-16 DIAGNOSIS — Z8673 Personal history of transient ischemic attack (TIA), and cerebral infarction without residual deficits: Secondary | ICD-10-CM | POA: Diagnosis not present

## 2022-07-16 DIAGNOSIS — E10319 Type 1 diabetes mellitus with unspecified diabetic retinopathy without macular edema: Secondary | ICD-10-CM | POA: Diagnosis not present

## 2022-07-16 DIAGNOSIS — E1059 Type 1 diabetes mellitus with other circulatory complications: Secondary | ICD-10-CM | POA: Diagnosis not present

## 2022-07-16 DIAGNOSIS — E1065 Type 1 diabetes mellitus with hyperglycemia: Secondary | ICD-10-CM | POA: Diagnosis not present

## 2022-07-16 DIAGNOSIS — Z9641 Presence of insulin pump (external) (internal): Secondary | ICD-10-CM | POA: Diagnosis not present

## 2022-07-16 DIAGNOSIS — N183 Chronic kidney disease, stage 3 unspecified: Secondary | ICD-10-CM | POA: Diagnosis not present

## 2022-07-18 ENCOUNTER — Other Ambulatory Visit: Payer: Self-pay | Admitting: *Deleted

## 2022-07-18 NOTE — Patient Outreach (Signed)
[place] Coordinator follow up. Mrs. Wunder resides in [place] [facility]. Screening for potential [facility] care coordination services as benefit of insurance plan and PCP.  Spoke with Janett Billow, SNF SW who reports Mrs. Degrasse and family intend for Mrs. Aull to return home post SNF. Family has been encouraged to consider alternative plans if unable to return home.   Will plan outreach to family to discuss potential [facility] services, as appropriate.   Marthenia Rolling, MSN, RN,BSN [place] Acute Care Coordinator [phone] (Direct dial)

## 2022-07-19 DIAGNOSIS — L89622 Pressure ulcer of left heel, stage 2: Secondary | ICD-10-CM | POA: Diagnosis not present

## 2022-07-19 DIAGNOSIS — L89323 Pressure ulcer of left buttock, stage 3: Secondary | ICD-10-CM | POA: Diagnosis not present

## 2022-07-20 DIAGNOSIS — F413 Other mixed anxiety disorders: Secondary | ICD-10-CM | POA: Diagnosis not present

## 2022-07-20 DIAGNOSIS — F32A Depression, unspecified: Secondary | ICD-10-CM | POA: Diagnosis not present

## 2022-07-20 DIAGNOSIS — R338 Other retention of urine: Secondary | ICD-10-CM | POA: Diagnosis not present

## 2022-07-20 DIAGNOSIS — F4320 Adjustment disorder, unspecified: Secondary | ICD-10-CM | POA: Diagnosis not present

## 2022-07-24 ENCOUNTER — Other Ambulatory Visit: Payer: Self-pay | Admitting: *Deleted

## 2022-07-24 NOTE — Patient Outreach (Signed)
[place] Coordinator follow up. Screening for potential [facility] care coordination services as benefit of insurance plan and PCP.   Spoke with Janett Billow SNF SW who reports Mrs. Almanzar is progressing slowly with therapy. Transition plans are pending.   Will continue to follow.    Marthenia Rolling, MSN, RN,BSN [place] Acute Care Coordinator [phone] (Direct dial)

## 2022-07-25 DIAGNOSIS — L89323 Pressure ulcer of left buttock, stage 3: Secondary | ICD-10-CM | POA: Diagnosis not present

## 2022-07-25 DIAGNOSIS — L89622 Pressure ulcer of left heel, stage 2: Secondary | ICD-10-CM | POA: Diagnosis not present

## 2022-07-27 DIAGNOSIS — F32A Depression, unspecified: Secondary | ICD-10-CM | POA: Diagnosis not present

## 2022-07-27 DIAGNOSIS — F413 Other mixed anxiety disorders: Secondary | ICD-10-CM | POA: Diagnosis not present

## 2022-07-27 DIAGNOSIS — F4320 Adjustment disorder, unspecified: Secondary | ICD-10-CM | POA: Diagnosis not present

## 2022-07-27 DIAGNOSIS — F5105 Insomnia due to other mental disorder: Secondary | ICD-10-CM | POA: Diagnosis not present

## 2022-07-31 ENCOUNTER — Other Ambulatory Visit: Payer: Self-pay | Admitting: *Deleted

## 2022-07-31 NOTE — Patient Outreach (Signed)
[facility] Post- Acute Care Coordinator follow up. Mrs. Seebeck resides in [place] [facility]. Screening for potential [facility] care coordination services as benefit of insurance plan and PCP.   Spoke with Hessie Diener [place] [facility] SW who reports Mrs. Penman is making progress with therapy. Family still intends on taking Mrs. Mitrano home post SNF. From home with spouse.   Will continue to follow.    Marthenia Rolling, MSN, RN,BSN [place] Acute Care Coordinator [phone] (Direct dial)

## 2022-08-01 DIAGNOSIS — L89622 Pressure ulcer of left heel, stage 2: Secondary | ICD-10-CM | POA: Diagnosis not present

## 2022-08-01 DIAGNOSIS — L89323 Pressure ulcer of left buttock, stage 3: Secondary | ICD-10-CM | POA: Diagnosis not present

## 2022-08-03 DIAGNOSIS — S72002A Fracture of unspecified part of neck of left femur, initial encounter for closed fracture: Secondary | ICD-10-CM | POA: Diagnosis not present

## 2022-08-03 DIAGNOSIS — Z96642 Presence of left artificial hip joint: Secondary | ICD-10-CM | POA: Diagnosis not present

## 2022-08-07 ENCOUNTER — Other Ambulatory Visit: Payer: Self-pay | Admitting: *Deleted

## 2022-08-07 NOTE — Patient Outreach (Signed)
[facility] Post- Acute Care Coordinator follow up. Per [facility] Mrs. Maxfield resides in [place] [facility]. Screening for potential [facility] care coordination needs as benefit of insurance plan and PCP.   Update received from [place], [place] indicating Mrs. Henle continues to make progress with therapy.   Will continue to follow for potential [facility] needs.    Marthenia Rolling, MSN, RN,BSN [place] Acute Care Coordinator [phone] (Direct dial)

## 2022-08-08 DIAGNOSIS — L89622 Pressure ulcer of left heel, stage 2: Secondary | ICD-10-CM | POA: Diagnosis not present

## 2022-08-08 DIAGNOSIS — L89323 Pressure ulcer of left buttock, stage 3: Secondary | ICD-10-CM | POA: Diagnosis not present

## 2022-08-09 DIAGNOSIS — I119 Hypertensive heart disease without heart failure: Secondary | ICD-10-CM | POA: Diagnosis not present

## 2022-08-09 DIAGNOSIS — E039 Hypothyroidism, unspecified: Secondary | ICD-10-CM | POA: Diagnosis not present

## 2022-08-09 DIAGNOSIS — N1831 Chronic kidney disease, stage 3a: Secondary | ICD-10-CM | POA: Diagnosis not present

## 2022-08-09 DIAGNOSIS — E1151 Type 2 diabetes mellitus with diabetic peripheral angiopathy without gangrene: Secondary | ICD-10-CM | POA: Diagnosis not present

## 2022-08-10 DIAGNOSIS — R531 Weakness: Secondary | ICD-10-CM | POA: Diagnosis not present

## 2022-08-10 DIAGNOSIS — D649 Anemia, unspecified: Secondary | ICD-10-CM | POA: Diagnosis not present

## 2022-08-10 DIAGNOSIS — J449 Chronic obstructive pulmonary disease, unspecified: Secondary | ICD-10-CM | POA: Diagnosis not present

## 2022-08-10 DIAGNOSIS — Z853 Personal history of malignant neoplasm of breast: Secondary | ICD-10-CM | POA: Diagnosis not present

## 2022-08-10 DIAGNOSIS — E876 Hypokalemia: Secondary | ICD-10-CM | POA: Diagnosis not present

## 2022-08-10 DIAGNOSIS — I639 Cerebral infarction, unspecified: Secondary | ICD-10-CM | POA: Diagnosis not present

## 2022-08-10 DIAGNOSIS — I6521 Occlusion and stenosis of right carotid artery: Secondary | ICD-10-CM | POA: Diagnosis not present

## 2022-08-10 DIAGNOSIS — Z7401 Bed confinement status: Secondary | ICD-10-CM | POA: Diagnosis not present

## 2022-08-10 DIAGNOSIS — I69354 Hemiplegia and hemiparesis following cerebral infarction affecting left non-dominant side: Secondary | ICD-10-CM | POA: Diagnosis not present

## 2022-08-10 DIAGNOSIS — E109 Type 1 diabetes mellitus without complications: Secondary | ICD-10-CM | POA: Diagnosis not present

## 2022-08-10 DIAGNOSIS — F419 Anxiety disorder, unspecified: Secondary | ICD-10-CM | POA: Diagnosis not present

## 2022-08-10 DIAGNOSIS — R29702 NIHSS score 2: Secondary | ICD-10-CM | POA: Diagnosis not present

## 2022-08-10 DIAGNOSIS — Z79899 Other long term (current) drug therapy: Secondary | ICD-10-CM | POA: Diagnosis not present

## 2022-08-10 DIAGNOSIS — F32A Depression, unspecified: Secondary | ICD-10-CM | POA: Diagnosis not present

## 2022-08-10 DIAGNOSIS — R4182 Altered mental status, unspecified: Secondary | ICD-10-CM | POA: Diagnosis not present

## 2022-08-10 DIAGNOSIS — E039 Hypothyroidism, unspecified: Secondary | ICD-10-CM | POA: Diagnosis not present

## 2022-08-10 DIAGNOSIS — Z7982 Long term (current) use of aspirin: Secondary | ICD-10-CM | POA: Diagnosis not present

## 2022-08-10 DIAGNOSIS — E7800 Pure hypercholesterolemia, unspecified: Secondary | ICD-10-CM | POA: Diagnosis not present

## 2022-08-10 DIAGNOSIS — R410 Disorientation, unspecified: Secondary | ICD-10-CM | POA: Diagnosis not present

## 2022-08-10 DIAGNOSIS — I10 Essential (primary) hypertension: Secondary | ICD-10-CM | POA: Diagnosis not present

## 2022-08-10 DIAGNOSIS — R55 Syncope and collapse: Secondary | ICD-10-CM | POA: Diagnosis not present

## 2022-08-10 DIAGNOSIS — Z882 Allergy status to sulfonamides status: Secondary | ICD-10-CM | POA: Diagnosis not present

## 2022-08-10 DIAGNOSIS — Z794 Long term (current) use of insulin: Secondary | ICD-10-CM | POA: Diagnosis not present

## 2022-08-10 DIAGNOSIS — M1990 Unspecified osteoarthritis, unspecified site: Secondary | ICD-10-CM | POA: Diagnosis not present

## 2022-08-10 DIAGNOSIS — N390 Urinary tract infection, site not specified: Secondary | ICD-10-CM | POA: Diagnosis not present

## 2022-08-10 DIAGNOSIS — Z87440 Personal history of urinary (tract) infections: Secondary | ICD-10-CM | POA: Diagnosis not present

## 2022-08-10 DIAGNOSIS — R5381 Other malaise: Secondary | ICD-10-CM | POA: Diagnosis not present

## 2022-08-14 ENCOUNTER — Encounter (INDEPENDENT_AMBULATORY_CARE_PROVIDER_SITE_OTHER): Payer: Medicare Other | Admitting: Ophthalmology

## 2022-08-14 ENCOUNTER — Other Ambulatory Visit: Payer: Self-pay | Admitting: *Deleted

## 2022-08-14 DIAGNOSIS — E785 Hyperlipidemia, unspecified: Secondary | ICD-10-CM | POA: Diagnosis not present

## 2022-08-14 DIAGNOSIS — I639 Cerebral infarction, unspecified: Secondary | ICD-10-CM | POA: Diagnosis not present

## 2022-08-14 DIAGNOSIS — F339 Major depressive disorder, recurrent, unspecified: Secondary | ICD-10-CM | POA: Diagnosis not present

## 2022-08-14 DIAGNOSIS — N390 Urinary tract infection, site not specified: Secondary | ICD-10-CM | POA: Diagnosis not present

## 2022-08-14 NOTE — Patient Outreach (Signed)
[place] Coordinator follow up. Mrs. Manship resides in [place] [facility]. Screening for potential [facility] care coordination services as benefit of insurance plan and PCP.   Spoke with Janett Billow, SNF social worker, who reports Mrs. Sienkiewicz has returned from acute hospital post TIA. Therapy continues to work with Mrs. Simao. Family still wants Mrs. Bowditch to return home.  Will continue to follow.   Marthenia Rolling, MSN, RN,BSN [place] Acute Care Coordinator [phone] (Direct dial)

## 2022-08-15 DIAGNOSIS — L89622 Pressure ulcer of left heel, stage 2: Secondary | ICD-10-CM | POA: Diagnosis not present

## 2022-08-15 DIAGNOSIS — L89323 Pressure ulcer of left buttock, stage 3: Secondary | ICD-10-CM | POA: Diagnosis not present

## 2022-08-21 ENCOUNTER — Other Ambulatory Visit: Payer: Self-pay | Admitting: *Deleted

## 2022-08-21 NOTE — Patient Outreach (Signed)
[facility] Post- Acute Care Coordinator follow up. Mrs. Doepke resides in [place] [facility]. Screening for potential [facility] care coordination services as benefit of insurance plan and PCP.  Spoke with Janett Billow, SNF social worker who indicates Mrs. Meacham will need to be min A with transfers before returning home. Currently mod/max. Otherwise, Mrs. Provencal is doing well. Transition plan is to return home with spouse.  Will continue to follow for potential [facility] needs.    Marthenia Rolling, MSN, RN,BSN [place] Acute Care Coordinator [phone] (Direct dial)

## 2022-08-24 DIAGNOSIS — F419 Anxiety disorder, unspecified: Secondary | ICD-10-CM | POA: Diagnosis not present

## 2022-08-28 ENCOUNTER — Other Ambulatory Visit: Payer: Self-pay | Admitting: *Deleted

## 2022-08-28 NOTE — Patient Outreach (Signed)
[facility] Post- Acute Care Coordinator follow up. April Long resides in [place] [facility].   Spoke with April Long, SNF SW. April Long is slowly progressing with therapy. Still plans to return home with spouse upon SNF discharge.   Will continue to follow.   April Rolling, MSN, RN,BSN [place] Acute Care Coordinator [phone] (Direct dial)

## 2022-09-07 ENCOUNTER — Other Ambulatory Visit: Payer: Self-pay | Admitting: *Deleted

## 2022-09-07 NOTE — Patient Outreach (Signed)
[place] Coordinator follow up. Mrs. Ishler resides in [place] [facility]. Screening for potential [facility] care coordination services as benefit of insurance plan and PCP.  Update received from [place], [place] social worker. Mrs. Bowery is progressing with therapy. Therapy is focusing on caregiver training. Goal is still to return home with spouse.   Will continue to follow.    Marthenia Rolling, MSN, RN,BSN [place] Acute Care Coordinator [phone] (Direct dial)

## 2022-09-12 ENCOUNTER — Ambulatory Visit: Payer: Medicare Other | Attending: Cardiology | Admitting: Cardiology

## 2022-09-12 ENCOUNTER — Other Ambulatory Visit: Payer: Self-pay | Admitting: Cardiology

## 2022-09-12 ENCOUNTER — Encounter: Payer: Self-pay | Admitting: Cardiology

## 2022-09-12 VITALS — BP 120/66 | HR 87 | Ht 67.0 in | Wt 160.0 lb

## 2022-09-12 DIAGNOSIS — I739 Peripheral vascular disease, unspecified: Secondary | ICD-10-CM

## 2022-09-12 DIAGNOSIS — I10 Essential (primary) hypertension: Secondary | ICD-10-CM | POA: Diagnosis not present

## 2022-09-12 DIAGNOSIS — Z8673 Personal history of transient ischemic attack (TIA), and cerebral infarction without residual deficits: Secondary | ICD-10-CM

## 2022-09-12 DIAGNOSIS — I4891 Unspecified atrial fibrillation: Secondary | ICD-10-CM

## 2022-09-12 DIAGNOSIS — E1029 Type 1 diabetes mellitus with other diabetic kidney complication: Secondary | ICD-10-CM | POA: Diagnosis not present

## 2022-09-12 DIAGNOSIS — I639 Cerebral infarction, unspecified: Secondary | ICD-10-CM

## 2022-09-12 NOTE — Patient Instructions (Signed)
Medication Instructions:  Your physician recommends that you continue on your current medications as directed. Please refer to the Current Medication list given to you today.  *If you need a refill on your cardiac medications before your next appointment, please call your pharmacy*   Lab Work: NONE If you have labs (blood work) drawn today and your tests are completely normal, you will receive your results only by: [place] (if you have MyChart) OR A paper copy in the mail If you have any lab test that is abnormal or we need to change your treatment, we will call you to review the results.   Testing/Procedures: Your physician has recommended that you wear an event monitor 30 days. Event monitors are medical devices that record the heart's electrical activity. Doctors most often [place] these monitors to diagnose arrhythmias. Arrhythmias are problems with the speed or rhythm of the heartbeat. The monitor is a small, portable device. You can wear one while you do your normal daily activities. This is usually used to diagnose what is causing palpitations/syncope (passing out). The monitor will be mailed to your home between 3-5 business days.    Follow-Up: At [facility], you and your health needs are our priority.  As part of our continuing mission to provide you with exceptional heart care, we have created designated Provider Care Teams.  These Care Teams include your primary Cardiologist (physician) and Advanced Practice Providers (APPs -  Physician Assistants and Nurse Practitioners) who all work together to provide you with the care you need, when you need it.  We recommend signing up for the patient portal called "MyChart".  Sign up information is provided on this After Visit Summary.  MyChart is used to connect with patients for Virtual Visits (Telemedicine).  Patients are able to view lab/test results, encounter notes, upcoming appointments, etc.  Non-urgent messages can be sent  to your provider as well.   To learn more about what you can do with MyChart, go to [email or web address].    Your next appointment:   6 month(s)  The format for your next appointment:   In Person  Provider:   Berniece Salines, DO     Other Instructions [employer] Cardiac Event Monitor Instructions Your physician has requested you wear your cardiac event monitor for 30 days, (1-30). [employer] may call or text to confirm a shipping address. The monitor will be sent to a land address via UPS. [employer] will not ship a monitor to a PO BOX. It typically takes 3-5 days to receive your monitor after it has been enrolled. [employer] will assist with USPS tracking if your package is delayed. The telephone number for [employer] is [phone]. Once you have received your monitor, please review the enclosed instructions. Instruction tutorials can also be viewed under help and settings on the enclosed cell phone. Your monitor has already been registered assigning a specific monitor serial # to you.  Applying the monitor Remove cell phone from case and turn it on. The cell phone works as Dealer and needs to be within [employer] of you at all times. The cell phone will need to be charged on a daily basis. We recommend you plug the cell phone into the enclosed charger at your bedside table every night.  Monitor batteries: You will receive two monitor batteries labelled #1 and #2. These are your recorders. Plug battery #2 onto the second connection on the enclosed charger. Keep one battery on the charger at all times. This will keep  the monitor battery deactivated. It will also keep it fully charged for when you need to switch your monitor batteries. A small light will be blinking on the battery emblem when it is charging. The light on the battery emblem will remain on when the battery is fully charged.  Open package of a Monitor strip. Insert battery #1 into black hood on strip and  gently squeeze monitor battery onto connection as indicated in instruction booklet. Set aside while preparing skin.  Choose location for your strip, vertical or horizontal, as indicated in the instruction booklet. Shave to remove all hair from location. There cannot be any lotions, oils, powders, or colognes on skin where monitor is to be applied. Wipe skin clean with enclosed Saline wipe. Dry skin completely.  Peel paper labeled #1 off the back of the Monitor strip exposing the adhesive. Place the monitor on the chest in the vertical or horizontal position shown in the instruction booklet. One arrow on the monitor strip must be pointing upward. Carefully remove paper labeled #2, attaching remainder of strip to your skin. Try not to create any folds or wrinkles in the strip as you apply it.  Firmly press and release the circle in the center of the monitor battery. You will hear a small beep. This is turning the monitor battery on. The heart emblem on the monitor battery will light up every 5 seconds if the monitor battery in turned on and connected to the patient securely. Do not push and hold the circle down as this turns the monitor battery off. The cell phone will locate the monitor battery. A screen will appear on the cell phone checking the connection of your monitor strip. This may read poor connection initially but change to good connection within the next minute. Once your monitor accepts the connection you will hear a series of 3 beeps followed by a climbing crescendo of beeps. A screen will appear on the cell phone showing the two monitor strip placement options. Touch the picture that demonstrates where you applied the monitor strip.  Your monitor strip and battery are waterproof. You are able to shower, bathe, or swim with the monitor on. They just ask you do not submerge deeper than 3 feet underwater. We recommend removing the monitor if you are swimming in a lake, river, or  ocean.  Your monitor battery will need to be switched to a fully charged monitor battery approximately once a week. The cell phone will alert you of an action which needs to be made.  On the cell phone, tap for details to reveal connection status, monitor battery status, and cell phone battery status. The green dots indicates your monitor is in good status. A red dot indicates there is something that needs your attention.  To record a symptom, click the circle on the monitor battery. In 30-60 seconds a list of symptoms will appear on the cell phone. Select your symptom and tap save. Your monitor will record a sustained or significant arrhythmia regardless of you clicking the button. Some patients do not feel the heart rhythm irregularities. [employer] will notify us of any serious or critical events.  Refer to instruction booklet for instructions on switching batteries, changing strips, the Do not disturb or Pause features, or any additional questions.  Call [employer] at [phone], to confirm your monitor is transmitting and record your baseline. They will answer any questions you may have regarding the monitor instructions at that time.  Returning the monitor to [place]  all equipment back into blue box. Peel off strip of paper to expose adhesive and close box securely. There is a prepaid UPS shipping label on this box. Drop in a UPS drop box, or at a UPS facility like [employer]. You may also contact [employer] to arrange UPS to pick up monitor package at your home.   Important Information About [place]

## 2022-09-12 NOTE — Progress Notes (Signed)
Cardiology Office Note:    Date:  09/14/2022   ID:  BARRI NEIDLINGER, DOB [birth year], MRN [id number]  PCP:  Ernestene Kiel, MD  Cardiologist:  Berniece Salines, DO  Electrophysiologist:  None   Referring MD: Ernestene Kiel, MD   " I am ok"  History of Present Illness:    April Long is a 76 y.o. female with a hx of PVCs, nonsustained VT, multiple CVAs with a TIA in 2014., OSA on CPAP, type 1 diabetes, PAD, CKD stage III, hypothyroidism,   Since I last saw the patient she had been seen at [facility] for EP consult.  At that time they reviewed all of her medical records here from [facility] health.  In suggested they follow her up as needed.  She was recently hospitalized at [facility] in October 2023 at that time she was found to have acute ischemic stroke she was placed on aspirin seen by telemetry neurology.  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  She was also treated for UTI while hospitalized.  Neurology recommended that she wear a monitor and was placed on aspirin and Plavix.   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   Long toenail 01/02/2016   Nondiabetic proliferative retinopathy 09/18/2011   Osteopenia of multiple sites 11/20/2016   Osteoporosis    early stage   PAD (peripheral artery disease) ([place]) 11/27/2017    Pain in right foot 11/26/2017   Pain of right thumb 11/04/2019   Paresthesia of upper limb 06/24/2019   Primary hypothyroidism 11/20/2016   Pseudophakia of right eye 07/19/2015   Right posterior capsular opacification 07/19/2015   Slurring of speech 08/03/2013   Stage 3a chronic kidney disease ([place]) 12/31/2018   Syncope 08/03/2013   Thyroid disease    Traction retinal detachment 05/20/2013    Past Surgical History:  Procedure Laterality Date   BREAST LUMPECTOMY Left 2012   BREAST SURGERY  2012   lumpectomy   EYE SURGERY  1983   retinal reattachment   TUBAL LIGATION  1974    Current Medications: Current Meds  Medication Sig   albuterol (VENTOLIN HFA) 108 (90 Base) MCG/ACT inhaler Inhale 2 puffs into the lungs every 6 (six) hours as needed for wheezing or shortness of breath.   ALPRAZolam (XANAX) 0.25 MG tablet Take 1 tablet (0.25 mg total) by mouth 2 (two) times daily as needed for anxiety.   atorvastatin (LIPITOR) 10 MG tablet Take 10 mg by mouth daily.   buPROPion (WELLBUTRIN XL) 300 MG 24 hr tablet Take 1 tablet once a day for anxiety and mood   Calcium Carbonate-Vitamin D 600-400 MG-UNIT tablet Take 1 tablet by mouth daily. With food   clopidogrel (PLAVIX) 75 MG tablet Take 75 mg by mouth daily.   CYANOCOBALAMIN-CAFFEINE PO [Gummy] Take  as directed   Fluticasone-Umeclidin-Vilant (TRELEGY ELLIPTA) 200-62.5-25 MCG/INH AEPB Inhale 1 puff into the lungs daily.   Insulin Aspart (NOVOLOG [place]) Inject into the skin. Insulin pump   ipratropium-albuterol (DUONEB) 0.5-2.5 (3) MG/3ML SOLN Take 3 mLs by nebulization every 6 (six) hours as needed.   levothyroxine (SYNTHROID) 50 MCG tablet Take 1 tablet by mouth as directed.   lisinopril (ZESTRIL) 10 MG tablet Take 1 tablet every day by oral route.   Multiple Vitamin (MULTIVITAMIN) tablet Take 2 tablets by mouth daily.    potassium chloride SA (KLOR-CON M) 20 MEQ tablet    VITAMIN D, CHOLECALCIFEROL, PO Take 1,000 Units by mouth.   ZOLPIDEM  TARTRATE PO Take by mouth.     Allergies:   Sulfa drugs cross reactors   Social History   Socioeconomic History   Marital status: Married    Spouse name: Not on file   Number of children: Not on file   Years of education: Not on file   Highest education level: Not on file  Occupational History   Not on file  Tobacco Use   Smoking status: Former    Types: Cigarettes    Quit date: 12/19/2000    Years since quitting: 21.7   Smokeless tobacco: Never  Vaping Use   Vaping Use: Never used  Substance and Sexual Activity   Alcohol use: Yes    Alcohol/week: 7.0 standard drinks of alcohol    Types: 7 Glasses of wine per week    Comment: 1 -2 glasses of wine daily or weekly   Drug use: No   Sexual activity: Yes  Other Topics Concern   Not on file  Social History Narrative   Right handed    lives with husband   Social Determinants of Health   Financial Resource Strain: Not on file  Food Insecurity: Not on file  Transportation Needs: Not on file  Physical Activity: Not on file  Stress: Not on file  Social Connections: Not on file     Family History: The patient's family history includes Alzheimer's disease in her mother; Heart disease in her father; Heart failure in her father. There is no history of Breast cancer.  ROS:   Review of Systems  Constitution: Negative for decreased appetite, fever and weight gain.  HENT: Negative for congestion, ear discharge, hoarse voice and sore throat.   Eyes: Negative for discharge, redness, vision loss in right eye and visual halos.  Cardiovascular: Negative for chest pain, dyspnea on exertion, leg swelling, orthopnea and palpitations.  Respiratory: Negative for cough, hemoptysis, shortness of breath and snoring.   Endocrine: Negative for heat intolerance and polyphagia.  Hematologic/Lymphatic: Negative for bleeding problem. Does not bruise/bleed easily.  Skin: Negative for flushing, nail changes, rash and suspicious lesions.   Musculoskeletal: Negative for arthritis, joint pain, muscle cramps, myalgias, neck pain and stiffness.  Gastrointestinal: Negative for abdominal pain, bowel incontinence, diarrhea and excessive appetite.  Genitourinary: Negative for decreased libido, genital sores and incomplete emptying.  Neurological: Negative for brief paralysis, focal weakness, headaches and loss of balance.  Psychiatric/Behavioral: Negative for altered mental status, depression and suicidal ideas.  Allergic/Immunologic: Negative for HIV exposure and persistent infections.    EKGs/Labs/Other Studies Reviewed:    The following studies were reviewed today:   EKG:  The ekg ordered today demonstrates sinus rhythm, heart rate 87 bpm with first-degree AV block  Zio monitor The patient wore the monitor for 3 days starting September 28, 2020. Indication: Paroxysmal atrial tachycardia  The minimum heart rate was 57 bpm, maximum heart rate was 111 bpm, and average heart rate was 73  bpm. Predominant underlying rhythm was Sinus Rhythm.   Premature atrial complexes were rare less than 1%. Premature Ventricular complexes were rare less than 1%.   No ventricular tachycardia, no pauses, No AV block, no supraventricular tachycardia and no atrial fibrillation present. No patient triggered events or diary events noted.    Conclusion: This was an unremarkable study with no significant arrhythmia.   Echocardiogram done at [facility] shows overall left systolic function 60 to 41%.  Right ventricle is normal size.  Left atrium is normal size.  Right atrium is normal in size and function.  There is mild aortic valve sclerosis.  Trace mitral regurgitation is present.  Mild tricuspid regurgitation is present.  Pulmonary artery systolic pressure 22 mmHg.  Aortic arch, ascending aorta and aortic root are appears to be within normals.   Pharmacologic nuclear stress test no evidence of infarction or ischemia.  Left ventricle ejection  fraction 69.  With normal wall motion.   CT of the chest no pulmonary embolism.  Mild pulmonary atelectasis or scarring.  Calcified atherosclerosis and injection of abdomen  Recent Labs: No results found for requested labs within last 365 days.  Recent Lipid Panel No results found for: "CHOL", "TRIG", "HDL", "CHOLHDL", "VLDL", "LDLCALC", "LDLDIRECT"  Physical Exam:    VS:  BP 120/66   Pulse 87   Ht '5\' 7"'$  (1.702 m)   Wt 160 lb (72.6 kg)   SpO2 93%   BMI 25.06 kg/m     Wt Readings from Last 3 Encounters:  09/12/22 160 lb (72.6 kg)  12/22/20 154 lb 6.4 oz (70 kg)  12/05/20 159 lb 12.8 oz (72.5 kg)     GEN: Well nourished, well developed in no acute distress HEENT: Normal NECK: No JVD; No carotid bruits LYMPHATICS: No lymphadenopathy CARDIAC: S1S2 noted,RRR, no murmurs, rubs, gallops RESPIRATORY:  Clear to auscultation without rales, wheezing or rhonchi  ABDOMEN: Soft, non-tender, non-distended, +bowel sounds, no guarding. EXTREMITIES: No edema, No cyanosis, no clubbing MUSCULOSKELETAL:  No deformity  SKIN: Warm and dry NEUROLOGIC:  Alert and oriented x 3, non-focal PSYCHIATRIC:  Normal affect, good insight  ASSESSMENT:    1. History of stroke   2. PAD (peripheral artery disease) ([place])   3. Primary hypertension   4. Type 1 diabetes mellitus with other kidney complication [facility])    PLAN:    She was recently hospitalized at [facility] for CVA, at the time of her hospitalization there were no evidence of any arrhythmia.  We will place a 30-day event monitor on the patient to rule out any atrial fibrillation.  If this is normal I think it would be best to refer the patient to EP for loop recorder as she has had multiple strokes.  Blood pressure is acceptable, continue with current antihypertensive regimen.  Hyperlipidemia - continue with current statin medication.  The patient is in agreement with the above plan. The patient left the office in stable condition.  The  patient will follow up in also sooner if needed.   Medication Adjustments/Labs and Tests Ordered: Current medicines are reviewed at length with the patient today.  Concerns regarding medicines are outlined above.  Orders Placed This Encounter  Procedures   EKG 12-Lead   No orders of the defined types were placed in this encounter.   Patient Instructions  Medication Instructions:  Your physician recommends that you continue on  your current medications as directed. Please refer to the Current Medication list given to you today.  *If you need a refill on your cardiac medications before your next appointment, please call your pharmacy*   Lab Work: NONE If you have labs (blood work) drawn today and your tests are completely normal, you will receive your results only by: [place] (if you have MyChart) OR A paper copy in the mail If you have any lab test that is abnormal or we need to change your treatment, we will call you to review the results.   Testing/Procedures: Your physician has recommended that you wear an event monitor 30 days. Event monitors are medical devices that record the heart's electrical activity. Doctors most often [place] these monitors to diagnose arrhythmias. Arrhythmias are problems with the speed or rhythm of the heartbeat. The monitor is a small, portable device. You can wear one while you do your normal daily activities. This is usually used to diagnose what is causing palpitations/syncope (passing out). The monitor will be mailed to your home between 3-5 business days.    Follow-Up: At [facility], you and your health needs are our priority.  As part of our continuing mission to provide you with exceptional heart care, we have created designated Provider Care Teams.  These Care Teams include your primary Cardiologist (physician) and Advanced Practice Providers (APPs -  Physician Assistants and Nurse Practitioners) who all work together to provide you  with the care you need, when you need it.  We recommend signing up for the patient portal called "MyChart".  Sign up information is provided on this After Visit Summary.  MyChart is used to connect with patients for Virtual Visits (Telemedicine).  Patients are able to view lab/test results, encounter notes, upcoming appointments, etc.  Non-urgent messages can be sent to your provider as well.   To learn more about what you can do with MyChart, go to [email or web address].    Your next appointment:   6 month(s)  The format for your next appointment:   In Person  Provider:   Berniece Salines, DO     Other Instructions [employer] Cardiac Event Monitor Instructions Your physician has requested you wear your cardiac event monitor for 30 days, (1-30). [employer] may call or text to confirm a shipping address. The monitor will be sent to a land address via UPS. [employer] will not ship a monitor to a PO BOX. It typically takes 3-5 days to receive your monitor after it has been enrolled. [employer] will assist with USPS tracking if your package is delayed. The telephone number for [employer] is [phone]. Once you have received your monitor, please review the enclosed instructions. Instruction tutorials can also be viewed under help and settings on the enclosed cell phone. Your monitor has already been registered assigning a specific monitor serial # to you.  Applying the monitor Remove cell phone from case and turn it on. The cell phone works as Dealer and needs to be within [employer] of you at all times. The cell phone will need to be charged on a daily basis. We recommend you plug the cell phone into the enclosed charger at your bedside table every night.  Monitor batteries: You will receive two monitor batteries labelled #1 and #2. These are your recorders. Plug battery #2 onto the second connection on the enclosed charger. Keep one battery on the charger at all times. This will  keep the monitor battery deactivated. It will also keep it  fully charged for when you need to switch your monitor batteries. A small light will be blinking on the battery emblem when it is charging. The light on the battery emblem will remain on when the battery is fully charged.  Open package of a Monitor strip. Insert battery #1 into black hood on strip and gently squeeze monitor battery onto connection as indicated in instruction booklet. Set aside while preparing skin.  Choose location for your strip, vertical or horizontal, as indicated in the instruction booklet. Shave to remove all hair from location. There cannot be any lotions, oils, powders, or colognes on skin where monitor is to be applied. Wipe skin clean with enclosed Saline wipe. Dry skin completely.  Peel paper labeled #1 off the back of the Monitor strip exposing the adhesive. Place the monitor on the chest in the vertical or horizontal position shown in the instruction booklet. One arrow on the monitor strip must be pointing upward. Carefully remove paper labeled #2, attaching remainder of strip to your skin. Try not to create any folds or wrinkles in the strip as you apply it.  Firmly press and release the circle in the center of the monitor battery. You will hear a small beep. This is turning the monitor battery on. The heart emblem on the monitor battery will light up every 5 seconds if the monitor battery in turned on and connected to the patient securely. Do not push and hold the circle down as this turns the monitor battery off. The cell phone will locate the monitor battery. A screen will appear on the cell phone checking the connection of your monitor strip. This may read poor connection initially but change to good connection within the next minute. Once your monitor accepts the connection you will hear a series of 3 beeps followed by a climbing crescendo of beeps. A screen will appear on the cell phone showing the  two monitor strip placement options. Touch the picture that demonstrates where you applied the monitor strip.  Your monitor strip and battery are waterproof. You are able to shower, bathe, or swim with the monitor on. They just ask you do not submerge deeper than 3 feet underwater. We recommend removing the monitor if you are swimming in a lake, river, or ocean.  Your monitor battery will need to be switched to a fully charged monitor battery approximately once a week. The cell phone will alert you of an action which needs to be made.  On the cell phone, tap for details to reveal connection status, monitor battery status, and cell phone battery status. The green dots indicates your monitor is in good status. A red dot indicates there is something that needs your attention.  To record a symptom, click the circle on the monitor battery. In 30-60 seconds a list of symptoms will appear on the cell phone. Select your symptom and tap save. Your monitor will record a sustained or significant arrhythmia regardless of you clicking the button. Some patients do not feel the heart rhythm irregularities. [employer] will notify us of any serious or critical events.  Refer to instruction booklet for instructions on switching batteries, changing strips, the Do not disturb or Pause features, or any additional questions.  Call [employer] at [phone], to confirm your monitor is transmitting and record your baseline. They will answer any questions you may have regarding the monitor instructions at that time.  Returning the monitor to [place] all equipment back into blue box. Peel off strip of  paper to expose adhesive and close box securely. There is a prepaid UPS shipping label on this box. Drop in a UPS drop box, or at a UPS facility like [employer]. You may also contact [employer] to arrange UPS to pick up monitor package at your home.   Important Information About Sugar          Adopting a Healthy Lifestyle.  Know what a healthy weight is for you (roughly BMI <25) and aim to maintain this   Aim for 7+ servings of fruits and vegetables daily   65-80+ fluid ounces of water or unsweet tea for healthy kidneys   Limit to max 1 drink of alcohol per day; avoid smoking/tobacco   Limit animal fats in diet for cholesterol and heart health - choose grass fed whenever available   Avoid highly processed foods, and foods high in saturated/trans fats   Aim for low stress - take time to unwind and care for your mental health   Aim for 150 min of moderate intensity exercise weekly for heart health, and weights twice weekly for bone health   Aim for 7-9 hours of sleep daily   When it comes to diets, agreement about the perfect plan isnt easy to find, even among the experts. Experts at the [employer] developed an idea known as the Healthy Eating Plate. Just imagine a plate divided into logical, healthy portions.   The emphasis is on diet quality:   Load up on vegetables and fruits - one-half of your plate: Aim for color and variety, and remember that potatoes dont count.   Go for whole grains - one-quarter of your plate: Whole wheat, barley, wheat berries, quinoa, oats, brown rice, and foods made with them. If you want pasta, go with whole wheat pasta.   Protein power - one-quarter of your plate: Fish, chicken, beans, and nuts are all healthy, versatile protein sources. Limit red meat.   The diet, however, does go beyond the plate, offering a few other suggestions.   Use healthy plant oils, such as olive, canola, soy, corn, sunflower and peanut. Check the labels, and avoid partially hydrogenated oil, which have unhealthy trans fats.   If youre thirsty, drink water. Coffee and tea are good in moderation, but skip sugary drinks and limit milk and dairy products to one or two daily servings.   The type of carbohydrate in the diet is more important  than the amount. Some sources of carbohydrates, such as vegetables, fruits, whole grains, and beans-are healthier than others.   Finally, stay active  Signed, Berniece Salines, DO  09/14/2022 12:31 PM     [facility]

## 2022-09-17 ENCOUNTER — Other Ambulatory Visit: Payer: Self-pay | Admitting: *Deleted

## 2022-09-17 DIAGNOSIS — I639 Cerebral infarction, unspecified: Secondary | ICD-10-CM | POA: Diagnosis not present

## 2022-09-17 DIAGNOSIS — I119 Hypertensive heart disease without heart failure: Secondary | ICD-10-CM | POA: Diagnosis not present

## 2022-09-17 DIAGNOSIS — E039 Hypothyroidism, unspecified: Secondary | ICD-10-CM | POA: Diagnosis not present

## 2022-09-17 DIAGNOSIS — E1151 Type 2 diabetes mellitus with diabetic peripheral angiopathy without gangrene: Secondary | ICD-10-CM | POA: Diagnosis not present

## 2022-09-17 NOTE — Patient Outreach (Signed)
[place] Coordinator follow up. Per [place] Mrs. Gravois resides in [facility]. Screening for potential [facility] care coordination services as benefit of insurance plan and PCP.   Secure communication sent to SNF social worker to request update on transition plans.   Will continue to follow.   Marthenia Rolling, MSN, RN,BSN [place] [facility] Coordinator [phone] (Direct dial)

## 2022-09-19 ENCOUNTER — Encounter: Payer: Self-pay | Admitting: Cardiology

## 2022-09-20 ENCOUNTER — Ambulatory Visit: Payer: Medicare Other | Attending: Cardiology

## 2022-09-20 DIAGNOSIS — Z8673 Personal history of transient ischemic attack (TIA), and cerebral infarction without residual deficits: Secondary | ICD-10-CM

## 2022-09-20 DIAGNOSIS — I639 Cerebral infarction, unspecified: Secondary | ICD-10-CM

## 2022-09-20 DIAGNOSIS — I4891 Unspecified atrial fibrillation: Secondary | ICD-10-CM | POA: Diagnosis not present

## 2022-09-26 ENCOUNTER — Other Ambulatory Visit: Payer: Self-pay | Admitting: *Deleted

## 2022-09-26 NOTE — Patient Outreach (Signed)
[place] Coordinator follow up. April Long resides in [facility]. Screening for potential [facility] care coordination services.   Spoke with April Long, SNF social worker. Anticipated transition plan is for April Long to transfer to LTC at [facility] until [facility] has a long term care bed available.   If April Long transitions to LTC, there will be no identifiable [facility] care coordination needs.   Will continue to follow.   April Rolling, MSN, RN,BSN [place] Acute Care Coordinator [phone] (Direct dial)

## 2022-10-04 DIAGNOSIS — N3946 Mixed incontinence: Secondary | ICD-10-CM | POA: Diagnosis not present

## 2022-10-04 DIAGNOSIS — F413 Other mixed anxiety disorders: Secondary | ICD-10-CM | POA: Diagnosis not present

## 2022-10-04 DIAGNOSIS — F32A Depression, unspecified: Secondary | ICD-10-CM | POA: Diagnosis not present

## 2022-10-04 DIAGNOSIS — R350 Frequency of micturition: Secondary | ICD-10-CM | POA: Diagnosis not present

## 2022-10-04 DIAGNOSIS — N3942 Incontinence without sensory awareness: Secondary | ICD-10-CM | POA: Diagnosis not present

## 2022-10-04 DIAGNOSIS — F5105 Insomnia due to other mental disorder: Secondary | ICD-10-CM | POA: Diagnosis not present

## 2022-10-17 DIAGNOSIS — Z8673 Personal history of transient ischemic attack (TIA), and cerebral infarction without residual deficits: Secondary | ICD-10-CM | POA: Diagnosis not present

## 2022-10-17 DIAGNOSIS — N183 Chronic kidney disease, stage 3 unspecified: Secondary | ICD-10-CM | POA: Diagnosis not present

## 2022-10-17 DIAGNOSIS — E1059 Type 1 diabetes mellitus with other circulatory complications: Secondary | ICD-10-CM | POA: Diagnosis not present

## 2022-10-17 DIAGNOSIS — E10319 Type 1 diabetes mellitus with unspecified diabetic retinopathy without macular edema: Secondary | ICD-10-CM | POA: Diagnosis not present

## 2022-10-17 DIAGNOSIS — E1022 Type 1 diabetes mellitus with diabetic chronic kidney disease: Secondary | ICD-10-CM | POA: Diagnosis not present

## 2022-10-19 DIAGNOSIS — F419 Anxiety disorder, unspecified: Secondary | ICD-10-CM | POA: Diagnosis not present

## 2022-10-19 DIAGNOSIS — F4320 Adjustment disorder, unspecified: Secondary | ICD-10-CM | POA: Diagnosis not present

## 2022-10-19 DIAGNOSIS — M810 Age-related osteoporosis without current pathological fracture: Secondary | ICD-10-CM | POA: Diagnosis not present

## 2022-10-19 DIAGNOSIS — N1831 Chronic kidney disease, stage 3a: Secondary | ICD-10-CM | POA: Diagnosis not present

## 2022-10-19 DIAGNOSIS — I639 Cerebral infarction, unspecified: Secondary | ICD-10-CM | POA: Diagnosis not present

## 2022-10-19 DIAGNOSIS — I739 Peripheral vascular disease, unspecified: Secondary | ICD-10-CM | POA: Diagnosis not present

## 2022-10-20 DIAGNOSIS — I639 Cerebral infarction, unspecified: Secondary | ICD-10-CM | POA: Diagnosis not present

## 2022-10-20 DIAGNOSIS — M6281 Muscle weakness (generalized): Secondary | ICD-10-CM | POA: Diagnosis not present

## 2022-10-20 DIAGNOSIS — R2689 Other abnormalities of gait and mobility: Secondary | ICD-10-CM | POA: Diagnosis not present

## 2023-02-05 ENCOUNTER — Encounter (INDEPENDENT_AMBULATORY_CARE_PROVIDER_SITE_OTHER): Payer: Medicare Other | Admitting: Ophthalmology

## 2023-02-05 DIAGNOSIS — H35033 Hypertensive retinopathy, bilateral: Secondary | ICD-10-CM | POA: Diagnosis not present

## 2023-02-05 DIAGNOSIS — I10 Essential (primary) hypertension: Secondary | ICD-10-CM | POA: Diagnosis not present

## 2023-02-05 DIAGNOSIS — H43813 Vitreous degeneration, bilateral: Secondary | ICD-10-CM | POA: Diagnosis not present

## 2023-02-05 DIAGNOSIS — H338 Other retinal detachments: Secondary | ICD-10-CM

## 2023-02-05 DIAGNOSIS — E103593 Type 1 diabetes mellitus with proliferative diabetic retinopathy without macular edema, bilateral: Secondary | ICD-10-CM

## 2023-03-19 ENCOUNTER — Ambulatory Visit: Payer: Medicare Other | Attending: Cardiology | Admitting: Cardiology

## 2023-10-16 IMAGING — MG MM DIGITAL SCREENING BILAT W/ TOMO AND CAD
8 series · 9 of 24 positions shown · non-contrast
Comparison: Previous exam(s).

CLINICAL DATA: Screening.

EXAM:
DIGITAL SCREENING BILATERAL MAMMOGRAM WITH TOMOSYNTHESIS AND CAD
TECHNIQUE: Bilateral screening digital craniocaudal and mediolateral oblique
mammograms were obtained. Bilateral screening digital breast
tomosynthesis was performed. The images were evaluated with
computer-aided detection.

[L CC synth-2D]
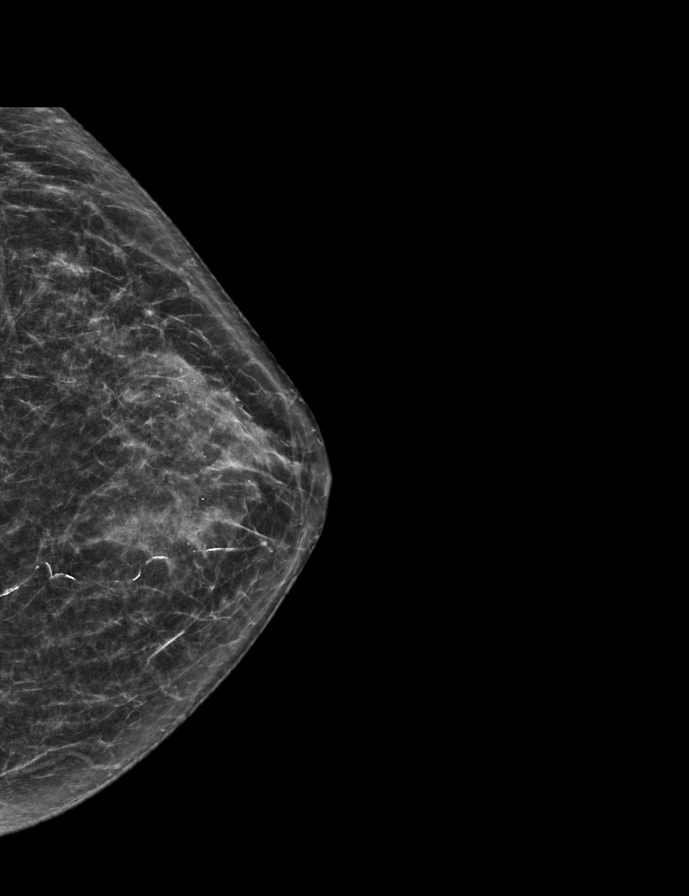

[R MLO synth-2D]
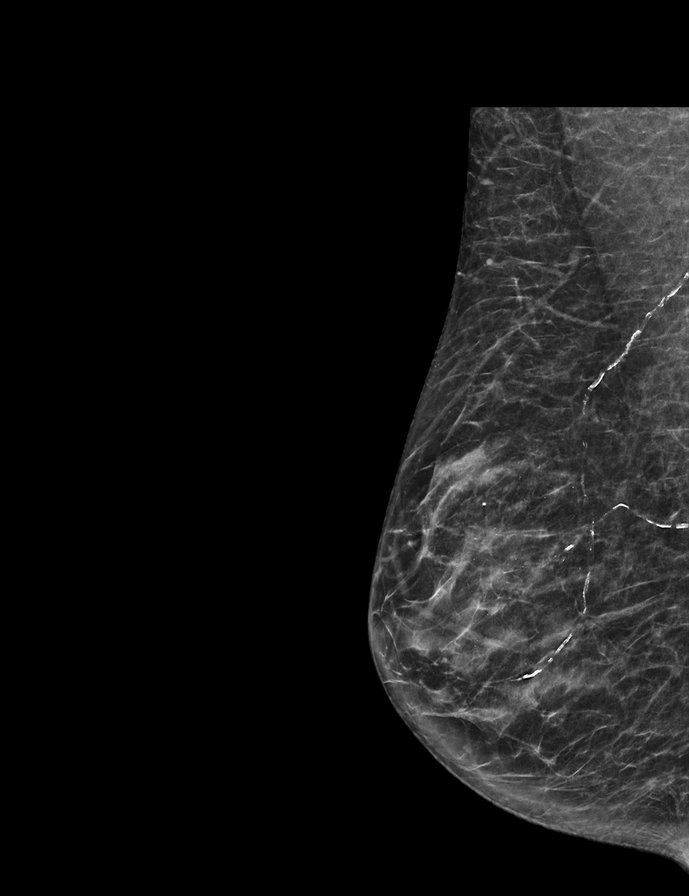

[R CC synth-2D]
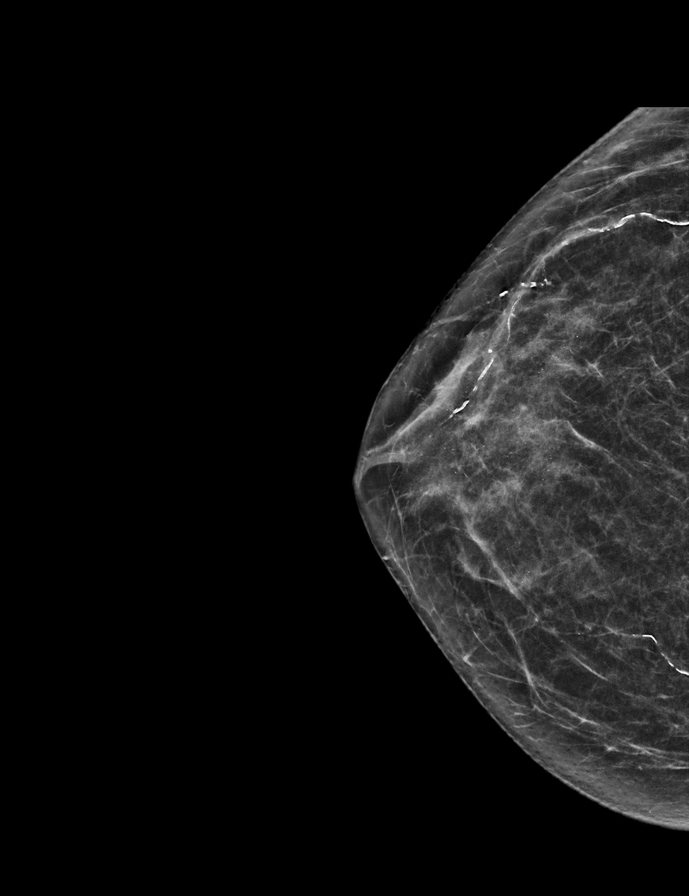

[L MLO synth-2D]
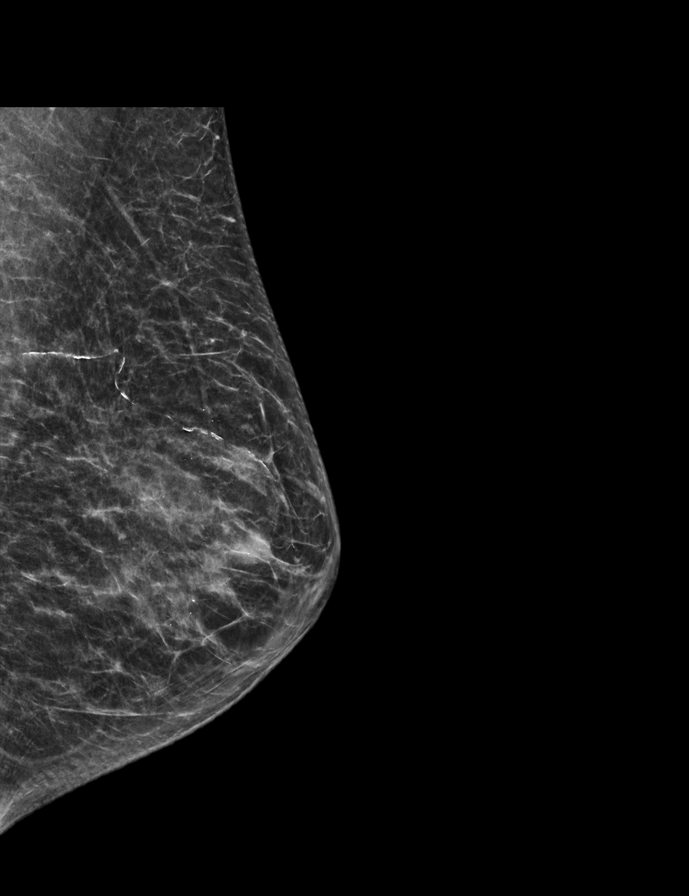

[L MLO tomo · 2 of 58 frames shown]
[frame 19/58]
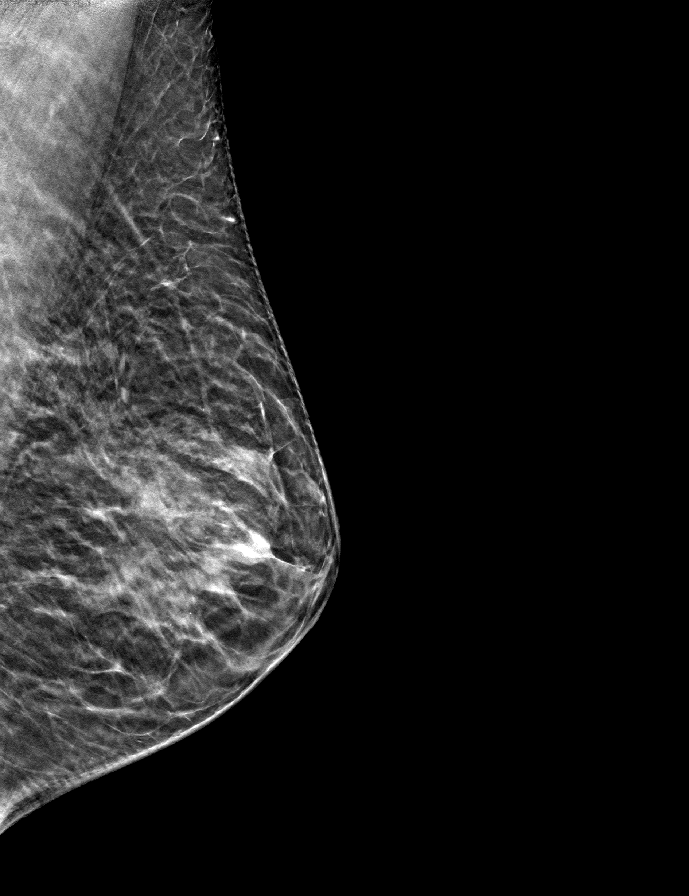
[frame 29/58]
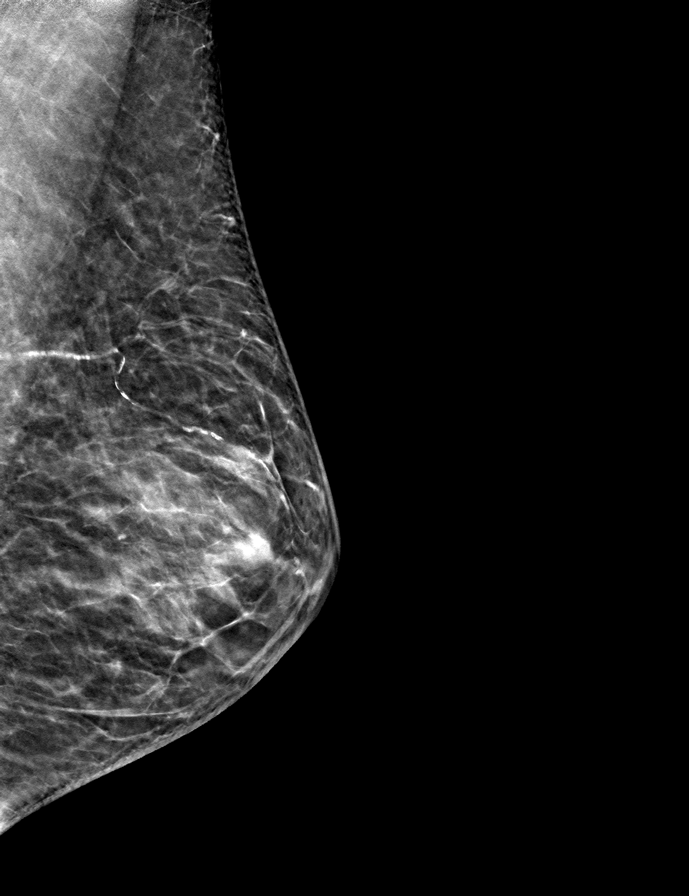

[R CC tomo · tomo slice 27/53.0]
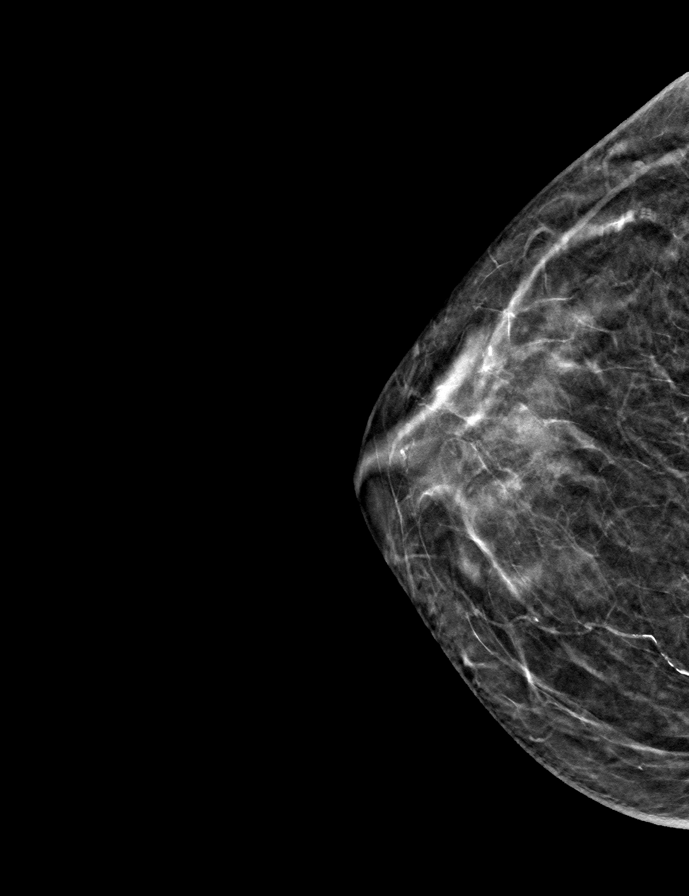

[L CC tomo · tomo slice 29/57.0]
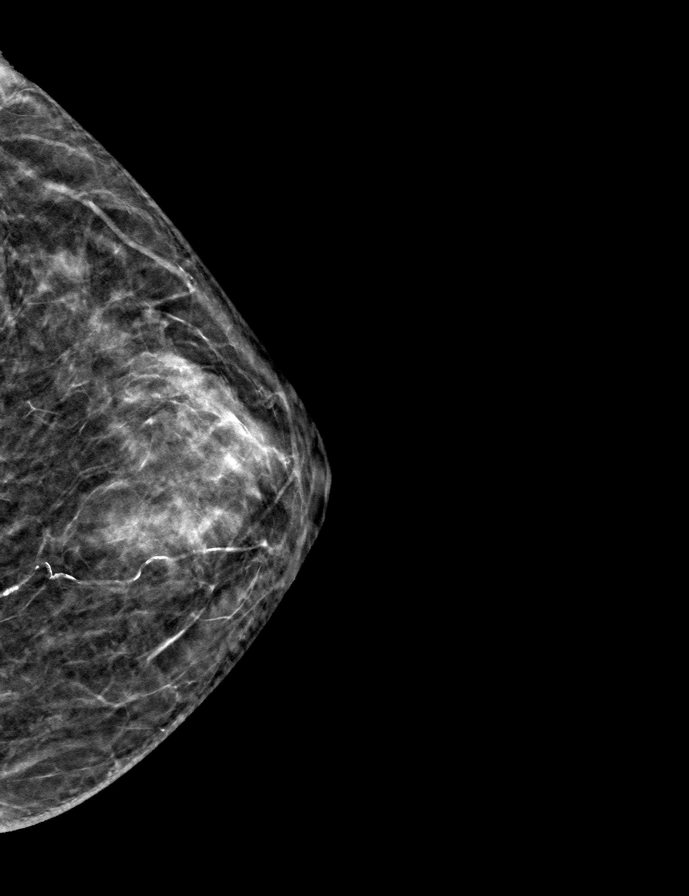

[R MLO tomo · tomo slice 29/57.0]
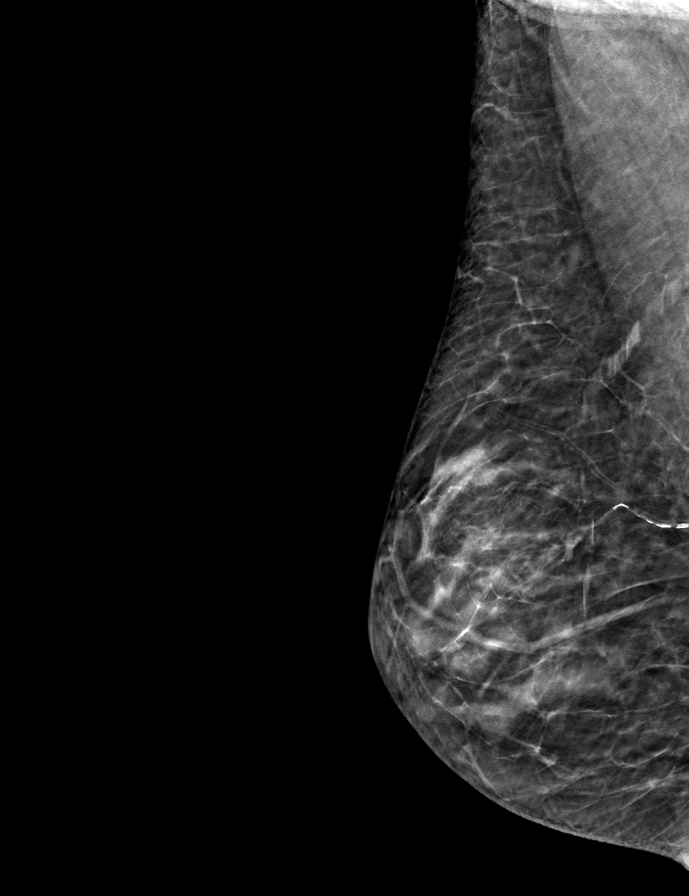

[9 of 24 positions shown; findings below may reference images not displayed]

ACR Breast Density Category b: There are scattered areas of
fibroglandular density.
FINDINGS: There are no findings suspicious for malignancy.
IMPRESSION: No mammographic evidence of malignancy. A result letter of this
screening mammogram will be mailed directly to the patient.

RECOMMENDATION:
Screening mammogram in one year. (Code:51-O-LD2)

BI-RADS CATEGORY  1: Negative.

## 2024-01-28 ENCOUNTER — Encounter (INDEPENDENT_AMBULATORY_CARE_PROVIDER_SITE_OTHER): Payer: Medicare Other | Admitting: Ophthalmology

## 2024-01-28 ENCOUNTER — Encounter (INDEPENDENT_AMBULATORY_CARE_PROVIDER_SITE_OTHER): Admitting: Ophthalmology

## 2024-01-28 DIAGNOSIS — H35033 Hypertensive retinopathy, bilateral: Secondary | ICD-10-CM | POA: Diagnosis not present

## 2024-01-28 DIAGNOSIS — I10 Essential (primary) hypertension: Secondary | ICD-10-CM

## 2024-01-28 DIAGNOSIS — Z794 Long term (current) use of insulin: Secondary | ICD-10-CM | POA: Diagnosis not present

## 2024-01-28 DIAGNOSIS — E103593 Type 1 diabetes mellitus with proliferative diabetic retinopathy without macular edema, bilateral: Secondary | ICD-10-CM | POA: Diagnosis not present

## 2024-01-28 DIAGNOSIS — H338 Other retinal detachments: Secondary | ICD-10-CM

## 2024-01-28 DIAGNOSIS — H43813 Vitreous degeneration, bilateral: Secondary | ICD-10-CM

## 2024-02-24 ENCOUNTER — Encounter (INDEPENDENT_AMBULATORY_CARE_PROVIDER_SITE_OTHER): Admitting: Ophthalmology

## 2025-01-27 ENCOUNTER — Encounter (INDEPENDENT_AMBULATORY_CARE_PROVIDER_SITE_OTHER): Admitting: Ophthalmology
